# Patient Record
Sex: Female | Born: 1995 | Race: Black or African American | Hispanic: No | Marital: Single | Smoking: Never smoker
Health system: Southern US, Community
[De-identification: ages and names within clinical notes are randomized; demographics above are authoritative.]

## PROBLEM LIST (undated history)

## (undated) ENCOUNTER — Inpatient Hospital Stay (HOSPITAL_COMMUNITY): Payer: Self-pay

## (undated) DIAGNOSIS — Z9109 Other allergy status, other than to drugs and biological substances: Secondary | ICD-10-CM

## (undated) DIAGNOSIS — R51 Headache: Secondary | ICD-10-CM

## (undated) DIAGNOSIS — D649 Anemia, unspecified: Secondary | ICD-10-CM

## (undated) DIAGNOSIS — O99013 Anemia complicating pregnancy, third trimester: Secondary | ICD-10-CM

## (undated) DIAGNOSIS — Q513 Bicornate uterus: Secondary | ICD-10-CM

## (undated) DIAGNOSIS — L732 Hidradenitis suppurativa: Secondary | ICD-10-CM

## (undated) DIAGNOSIS — L709 Acne, unspecified: Secondary | ICD-10-CM

## (undated) HISTORY — DX: Acne, unspecified: L70.9

## (undated) HISTORY — PX: TONSILLECTOMY: SHX5217

## (undated) HISTORY — DX: Anemia complicating pregnancy, third trimester: O99.013

## (undated) HISTORY — PX: TUBAL LIGATION: SHX77

## (undated) HISTORY — DX: Headache: R51

## (undated) HISTORY — PX: TONSILLECTOMY: SUR1361

---
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
## 2015-03-25 ENCOUNTER — Emergency Department (INDEPENDENT_AMBULATORY_CARE_PROVIDER_SITE_OTHER)
Admission: EM | Admit: 2015-03-25 | Discharge: 2015-03-25 | Disposition: A | Discharge status: Home / Self Care | Payer: Medicaid Other | Source: Home / Self Care | Attending: Family Medicine | Admitting: Family Medicine

## 2015-03-25 DIAGNOSIS — K088 Other specified disorders of teeth and supporting structures: Secondary | ICD-10-CM | POA: Diagnosis not present

## 2015-03-25 DIAGNOSIS — K0889 Other specified disorders of teeth and supporting structures: Secondary | ICD-10-CM

## 2015-03-25 MED ORDER — ONDANSETRON 4 MG PO TBDP: 8.0000 mg | ORAL_TABLET | Freq: Once | ORAL | Status: DC

## 2015-03-25 MED ORDER — DICLOFENAC POTASSIUM 50 MG PO TABS: 50.0000 mg | ORAL_TABLET | Freq: Three times a day (TID) | ORAL | Status: DC

## 2015-03-25 MED ORDER — ONDANSETRON 4 MG PO TBDP
ORAL_TABLET | ORAL | Status: AC
  Filled 2015-03-25: qty 2

## 2015-03-25 NOTE — Discharge Instructions (Signed)
Take medicine as prescribed, see your dentist as soon as possible °

## 2015-03-25 NOTE — ED Notes (Signed)
Left mouth pain, HA, dizzy and occ vomiting x 2 days

## 2015-03-25 NOTE — ED Provider Notes (Signed)
CSN: [id number]     Arrival date & time 03/25/15  1705 History   First MD Initiated Contact with Patient 03/25/15 1803     Chief Complaint  Patient presents with  . Dental Pain   (Consider location/radiation/quality/duration/timing/severity/associated sxs/prior Treatment) Patient is a 19 y.o. female presenting with tooth pain. The history is provided by the patient.  Dental Pain Location:  Lower Lower teeth location:  17/LL 3rd molar Quality:  Aching and shooting Severity:  Mild Onset quality:  Gradual Duration:  2 days Progression:  Unchanged Chronicity:  New Context comment:  Also with n/v. has braces on teeth, to see dentist in am mon. Relieved by:  None tried Worsened by:  Nothing tried Ineffective treatments:  None tried Associated symptoms: no fever and no gum swelling   Risk factors: sufficient dental care and no periodontal disease   Risk factors comment:  Denies pregnancy   Past Medical History  Diagnosis Date  . Environmental allergies   . WUJWJXBJ(478.2Headache(784.0)    Past Surgical History  Procedure Laterality Date  . Tonsillectomy    . Tonsillectomy     Family History  Problem Relation Age of Onset  . Depression Mother    History  Substance Use Topics  . Smoking status: Never Smoker   . Smokeless tobacco: Never Used  . Alcohol Use: No   OB History    No data available     Review of Systems  Constitutional: Negative.  Negative for fever.  HENT: Positive for dental problem.   Gastrointestinal: Positive for nausea and vomiting. Negative for diarrhea.  Musculoskeletal: Negative.     Allergies  Amoxicillin-pot clavulanate and Metronidazole  Home Medications   Prior to Admission medications   Medication Sig Start Date End Date Taking? Authorizing Provider  acetaminophen (TYLENOL) 500 MG tablet Take 1,000 mg by mouth every 6 (six) hours as needed for moderate pain.    Historical Provider, MD  albuterol (PROVENTIL HFA;VENTOLIN HFA) 108 (90 BASE) MCG/ACT  inhaler Inhale 2 puffs into the lungs every 4 (four) hours as needed for wheezing or shortness of breath. 01/27/14   Owens SharkMartha F Perry, MD  diclofenac (CATAFLAM) 50 MG tablet Take 1 tablet (50 mg total) by mouth 3 (three) times daily. For dental pain 03/25/15   Linna HoffJames D Jerni Selmer, MD  diclofenac (VOLTAREN) 50 MG EC tablet Take 1 tablet (50 mg total) by mouth 2 (two) times daily. 07/26/14   Narda Bondsalph A Nettey, MD  Etonogestrel [facility](IMPL[facility] [place]) Inject 1 application into the skin continuous. Implanted January 28, 2013    Historical Provider, MD  fluconazole (DIFLUCAN) 150 MG tablet Take 1 tablet (150 mg total) by mouth once. 09/28/14   Voncille LoKate Ettefagh, MD  fluticasone (FLONASE) 50 MCG/ACT nasal spray Place 2 sprays into both nostrils daily as needed for rhinitis. 01/27/14   Owens SharkMartha F Perry, MD  hydrOXYzine (ATARAX/VISTARIL) 50 MG tablet Take 1 tablet (50 mg total) by mouth at bedtime. 09/27/14   Voncille LoKate Ettefagh, MD  loratadine (CLARITIN) 10 MG tablet Take 1 tablet (10 mg total) by mouth daily. 03/28/14   Marlon Peliffany Greene, PA-C  tetrahydrozoline 0.05 % ophthalmic solution Place 2 drops into both eyes 2 (two) times daily as needed (for dry eyes).    Historical Provider, MD   BP 122/70 mmHg  Pulse 76  Temp(Src) 99.2 F (37.3 C) (Oral)  Resp 16  SpO2 100%  LMP 03/23/2015 Physical Exam  Constitutional: She is oriented to person, place, and time. She appears well-developed and well-nourished. No distress.  HENT:  Mouth/Throat: Oropharynx is clear and moist.  Braces in place and intact.tender over 3rd #17 tooth.  Neck: Normal range of motion. Neck supple.  Abdominal: Soft. Bowel sounds are normal. There is no tenderness.  Lymphadenopathy:    She has no cervical adenopathy.  Neurological: She is alert and oriented to person, place, and time.  Skin: Skin is warm.  Nursing note and vitals reviewed.   ED Course  Procedures (including critical care time) Labs Review Labs Reviewed - No data to display  Imaging Review No  results found.   MDM   1. Pain, dental        Linna HoffJames D Dashiell Franchino, MD 03/25/15 [phone]

## 2015-05-09 ENCOUNTER — Emergency Department (HOSPITAL_COMMUNITY)
Admission: EM | Admit: 2015-05-09 | Discharge: 2015-05-10 | Disposition: A | Discharge status: Home / Self Care | Payer: Federal, State, Local not specified - PPO | Attending: Emergency Medicine | Admitting: Emergency Medicine

## 2015-05-09 ENCOUNTER — Encounter (HOSPITAL_COMMUNITY): Payer: Self-pay | Admitting: Emergency Medicine

## 2015-05-09 DIAGNOSIS — J069 Acute upper respiratory infection, unspecified: Secondary | ICD-10-CM

## 2015-05-09 DIAGNOSIS — H9209 Otalgia, unspecified ear: Secondary | ICD-10-CM | POA: Diagnosis not present

## 2015-05-09 DIAGNOSIS — Z79899 Other long term (current) drug therapy: Secondary | ICD-10-CM | POA: Diagnosis not present

## 2015-05-09 DIAGNOSIS — R0981 Nasal congestion: Secondary | ICD-10-CM | POA: Diagnosis present

## 2015-05-09 DIAGNOSIS — B9789 Other viral agents as the cause of diseases classified elsewhere: Secondary | ICD-10-CM

## 2015-05-09 NOTE — ED Notes (Signed)
Pt. reports nasal congestion onset this week unrelieved by OTC decongestants , denies fever or chills.

## 2015-05-09 NOTE — ED Notes (Addendum)
Spoke with Dahlia Client PA about triage pain protocol, Dahlia Client PA declined to give medication at this time.

## 2015-05-09 NOTE — ED Provider Notes (Signed)
CSN: [id number]     Arrival date & time 05/09/15  2216 History   This chart was scribed for Dierdre Forth, PA-C working with Vanetta Mulders, MD by Elveria Rising, ED Scribe. This patient was seen in room TR08C/TR08C and the patient's care was started at 12:03 AM.   Chief Complaint  Patient presents with  . Nasal Congestion   The history is provided by the patient and medical records. No language interpreter was used.   HPI Comments: Judy Rodgers is a 19 y.o. female who presents to the Emergency Department complaining of URI like symptoms including nasal congestion, cough, wheezing, headache and ear pain onset two days ago. Patient reports treatment with Sudafed, Dayquil, and Afrin without relief. Patient shares seasonal history of seasonal, asthmatic allergies. Patient has an at home inhaler which she needs updated. Patient shares that she has an appointment in two weeks with her PCP but she states she couldn't wait until then. Patient shares that she had to leave work today because her worsening symptoms. Patient reports recent sick contacts: niece and nephew.  She denies fever, chills, neck pain, neck stiffness, chest pain, shortness of breath, abdominal pain, nausea, vomiting, diarrhea.  Past Medical History  Diagnosis Date  . Environmental allergies   . WUJWJXBJ(478.2)    Past Surgical History  Procedure Laterality Date  . Tonsillectomy    . Tonsillectomy     Family History  Problem Relation Age of Onset  . Depression Mother    History  Substance Use Topics  . Smoking status: Never Smoker   . Smokeless tobacco: Never Used  . Alcohol Use: No   OB History    No data available     Review of Systems  Constitutional: Positive for fatigue. Negative for fever, chills and appetite change.  HENT: Positive for congestion, ear pain, postnasal drip, rhinorrhea, sinus pressure and sore throat. Negative for ear discharge and mouth sores.   Eyes: Negative for visual disturbance.   Respiratory: Positive for cough, chest tightness and wheezing. Negative for shortness of breath and stridor.   Cardiovascular: Negative for chest pain, palpitations and leg swelling.  Gastrointestinal: Negative for nausea, vomiting, abdominal pain and diarrhea.  Genitourinary: Negative for dysuria, urgency, frequency and hematuria.  Musculoskeletal: Negative for myalgias, back pain, arthralgias and neck stiffness.  Skin: Negative for rash.  Neurological: Positive for headaches (frontal). Negative for syncope, light-headedness and numbness.  Hematological: Negative for adenopathy.  Psychiatric/Behavioral: The patient is not nervous/anxious.   All other systems reviewed and are negative.  Allergies  Amoxicillin-pot clavulanate and Metronidazole  Home Medications   Prior to Admission medications   Medication Sig Start Date End Date Taking? Authorizing Provider  acetaminophen (TYLENOL) 500 MG tablet Take 1,000 mg by mouth every 6 (six) hours as needed for moderate pain.    Historical Provider, MD  albuterol (PROVENTIL HFA;VENTOLIN HFA) 108 (90 BASE) MCG/ACT inhaler Inhale 2 puffs into the lungs every 4 (four) hours as needed for wheezing or shortness of breath. 05/10/15   America Sandall, PA-C  benzonatate (TESSALON) 100 MG capsule Take 1 capsule (100 mg total) by mouth every 8 (eight) hours. 05/10/15   Yulianna Folse, PA-C  diclofenac (CATAFLAM) 50 MG tablet Take 1 tablet (50 mg total) by mouth 3 (three) times daily. For dental pain 03/25/15   Linna Hoff, MD  diclofenac (VOLTAREN) 50 MG EC tablet Take 1 tablet (50 mg total) by mouth 2 (two) times daily. 07/26/14   Narda Bonds, MD  Etonogestrel [facility]  [place]) Inject 1 application into the skin continuous. Implanted January 28, 2013    Historical Provider, MD  fluconazole (DIFLUCAN) 150 MG tablet Take 1 tablet (150 mg total) by mouth once. 09/28/14   Voncille Lo, MD  fluticasone (FLONASE) 50 MCG/ACT nasal spray Place 2 sprays into both  nostrils daily as needed for rhinitis. 01/27/14   Owens Shark, MD  hydrOXYzine (ATARAX/VISTARIL) 50 MG tablet Take 1 tablet (50 mg total) by mouth at bedtime. 09/27/14   Voncille Lo, MD  loratadine (CLARITIN) 10 MG tablet Take 1 tablet (10 mg total) by mouth daily. 03/28/14   Tiffany Neva Seat, PA-C  mometasone (NASONEX) 50 MCG/ACT nasal spray Place 2 sprays into the nose daily. 05/10/15   Kaitlynne Wenz, PA-C  tetrahydrozoline 0.05 % ophthalmic solution Place 2 drops into both eyes 2 (two) times daily as needed (for dry eyes).    Historical Provider, MD   Triage Vitals: BP 119/67 mmHg  Pulse 87  Temp(Src) 98.5 F (36.9 C) (Oral)  Resp 16  SpO2 100%  LMP 05/02/2015 Physical Exam  Constitutional: She is oriented to person, place, and time. She appears well-developed and well-nourished. No distress.  HENT:  Head: Normocephalic and atraumatic.  Right Ear: Tympanic membrane, external ear and ear canal normal.  Left Ear: Tympanic membrane, external ear and ear canal normal.  Nose: Mucosal edema and rhinorrhea present. No epistaxis. Right sinus exhibits no maxillary sinus tenderness and no frontal sinus tenderness. Left sinus exhibits no maxillary sinus tenderness and no frontal sinus tenderness.  Mouth/Throat: Uvula is midline, oropharynx is clear and moist and mucous membranes are normal. Mucous membranes are not pale and not cyanotic. No oropharyngeal exudate, posterior oropharyngeal edema, posterior oropharyngeal erythema or tonsillar abscesses.  Eyes: Conjunctivae are normal. Pupils are equal, round, and reactive to light.  Neck: Normal range of motion and full passive range of motion without pain.  Cardiovascular: Normal rate and intact distal pulses.   Pulmonary/Chest: Effort normal. No stridor. She has decreased breath sounds ( Throughout).  Diminished but equal breath sounds without focal wheezes, rhonchi, rales  Abdominal: Soft. Bowel sounds are normal. There is no tenderness.   Musculoskeletal: Normal range of motion.  Lymphadenopathy:    She has no cervical adenopathy.  Neurological: She is alert and oriented to person, place, and time.  Skin: Skin is warm and dry. No rash noted. She is not diaphoretic.  Psychiatric: She has a normal mood and affect.  Nursing note and vitals reviewed.   ED Course  Procedures (including critical care time)  COORDINATION OF CARE: 12:09 AM- Discussed treatment plan with patient at bedside and patient agreed to plan.   Labs Review Labs Reviewed - No data to display  Imaging Review No results found.   EKG Interpretation None      MDM   Final diagnoses:  Viral URI with cough   Judy Rodgers presents with URI symptoms.  Pt without systemic symptoms, fever or focal lung sounds, no indication for chest x-ray at this time. Patients symptoms are consistent with URI, likely viral etiology. No tenderness to palpation of the sinuses. Discussed that antibiotics are not indicated for viral infections. She complains of chest tightness and a history of wheezing due to seasonal allergies. Recommend the patient begin Zyrtec. Will give albuterol here.  12:58 AM Pt with increased tidal volume after albuterol treatment.  No focal wheezing noted. Patient with normal oxygen saturation saturations and in no distress.  Pt will be discharged with symptomatic treatment.  Verbalizes understanding and is agreeable with plan. Pt is hemodynamically stable & in NAD prior to dc.   BP 133/69 mmHg  Pulse 91  Temp(Src) 98.1 F (36.7 C) (Oral)  Resp 22  SpO2 100%  LMP 05/02/2015  I personally performed the services described in this documentation, which was scribed in my presence. The recorded information has been reviewed and is accurate.   Dahlia Client Carmilla Granville, PA-C 05/10/15 [id number]  Vanetta Mulders, MD 05/13/15 [phone]

## 2015-05-10 DIAGNOSIS — J069 Acute upper respiratory infection, unspecified: Secondary | ICD-10-CM | POA: Diagnosis not present

## 2015-05-10 MED ORDER — ALBUTEROL SULFATE (2.5 MG/3ML) 0.083% IN NEBU
5.0000 mg | INHALATION_SOLUTION | Freq: Once | RESPIRATORY_TRACT | Status: AC
  Administered 2015-05-10: 5 mg via RESPIRATORY_TRACT
  Filled 2015-05-10: qty 6

## 2015-05-10 MED ORDER — ALBUTEROL SULFATE HFA 108 (90 BASE) MCG/ACT IN AERS: 2.0000 | INHALATION_SPRAY | RESPIRATORY_TRACT | Status: DC | PRN

## 2015-05-10 MED ORDER — BENZONATATE 100 MG PO CAPS
200.0000 mg | ORAL_CAPSULE | Freq: Once | ORAL | Status: AC
  Administered 2015-05-10: 200 mg via ORAL
  Filled 2015-05-10: qty 2

## 2015-05-10 MED ORDER — BENZONATATE 100 MG PO CAPS: 100.0000 mg | ORAL_CAPSULE | Freq: Three times a day (TID) | ORAL | Status: DC

## 2015-05-10 MED ORDER — MOMETASONE FUROATE 50 MCG/ACT NA SUSP: 2.0000 | Freq: Every day | NASAL | Status: DC

## 2015-05-10 NOTE — Discharge Instructions (Signed)
1. Medications: albuterol, flonase, mucinex, tessalon, usual home medications 2. Treatment: rest, drink plenty of fluids, take tylenol or ibuprofen for fever control 3. Follow Up: Please followup with your primary doctor in 3 days for discussion of your diagnoses and further evaluation after today's visit; if you do not have a primary care doctor use the resource guide provided to find one; Return to the ER for high fevers, difficulty breathing or other concerning symptoms    Upper Respiratory Infection, Adult An upper respiratory infection (URI) is also sometimes known as the common cold. The upper respiratory tract includes the nose, sinuses, throat, trachea, and bronchi. Bronchi are the airways leading to the lungs. Most people improve within 1 week, but symptoms can last up to 2 weeks. A residual cough may last even longer.  CAUSES Many different viruses can infect the tissues lining the upper respiratory tract. The tissues become irritated and inflamed and often become very moist. Mucus production is also common. A cold is contagious. You can easily spread the virus to others by oral contact. This includes kissing, sharing a glass, coughing, or sneezing. Touching your mouth or nose and then touching a surface, which is then touched by another person, can also spread the virus. SYMPTOMS  Symptoms typically develop 1 to 3 days after you come in contact with a cold virus. Symptoms vary from person to person. They may include:  Runny nose.  Sneezing.  Nasal congestion.  Sinus irritation.  Sore throat.  Loss of voice (laryngitis).  Cough.  Fatigue.  Muscle aches.  Loss of appetite.  Headache.  Low-grade fever. DIAGNOSIS  You might diagnose your own cold based on familiar symptoms, since most people get a cold 2 to 3 times a year. Your caregiver can confirm this based on your exam. Most importantly, your caregiver can check that your symptoms are not due to another disease such as  strep throat, sinusitis, pneumonia, asthma, or epiglottitis. Blood tests, throat tests, and X-rays are not necessary to diagnose a common cold, but they may sometimes be helpful in excluding other more serious diseases. Your caregiver will decide if any further tests are required. RISKS AND COMPLICATIONS  You may be at risk for a more severe case of the common cold if you smoke cigarettes, have chronic heart disease (such as heart failure) or lung disease (such as asthma), or if you have a weakened immune system. The very young and very old are also at risk for more serious infections. Bacterial sinusitis, middle ear infections, and bacterial pneumonia can complicate the common cold. The common cold can worsen asthma and chronic obstructive pulmonary disease (COPD). Sometimes, these complications can require emergency medical care and may be life-threatening. PREVENTION  The best way to protect against getting a cold is to practice good hygiene. Avoid oral or hand contact with people with cold symptoms. Wash your hands often if contact occurs. There is no clear evidence that vitamin C, vitamin E, echinacea, or exercise reduces the chance of developing a cold. However, it is always recommended to get plenty of rest and practice good nutrition. TREATMENT  Treatment is directed at relieving symptoms. There is no cure. Antibiotics are not effective, because the infection is caused by a virus, not by bacteria. Treatment may include:  Increased fluid intake. Sports drinks offer valuable electrolytes, sugars, and fluids.  Breathing heated mist or steam (vaporizer or shower).  Eating chicken soup or other clear broths, and maintaining good nutrition.  Getting plenty of rest.  Using gargles or lozenges for comfort.  Controlling fevers with ibuprofen or acetaminophen as directed by your caregiver.  Increasing usage of your inhaler if you have asthma. Zinc gel and zinc lozenges, taken in the first 24 hours  of the common cold, can shorten the duration and lessen the severity of symptoms. Pain medicines may help with fever, muscle aches, and throat pain. A variety of non-prescription medicines are available to treat congestion and runny nose. Your caregiver can make recommendations and may suggest nasal or lung inhalers for other symptoms.  HOME CARE INSTRUCTIONS   Only take over-the-counter or prescription medicines for pain, discomfort, or fever as directed by your caregiver.  Use a warm mist humidifier or inhale steam from a shower to increase air moisture. This may keep secretions moist and make it easier to breathe.  Drink enough water and fluids to keep your urine clear or pale yellow.  Rest as needed.  Return to work when your temperature has returned to normal or as your caregiver advises. You may need to stay home longer to avoid infecting others. You can also use a face mask and careful hand washing to prevent spread of the virus. SEEK MEDICAL CARE IF:   After the first few days, you feel you are getting worse rather than better.  You need your caregiver's advice about medicines to control symptoms.  You develop chills, worsening shortness of breath, or brown or red sputum. These may be signs of pneumonia.  You develop yellow or brown nasal discharge or pain in the face, especially when you bend forward. These may be signs of sinusitis.  You develop a fever, swollen neck glands, pain with swallowing, or white areas in the back of your throat. These may be signs of strep throat. SEEK IMMEDIATE MEDICAL CARE IF:   You have a fever.  You develop severe or persistent headache, ear pain, sinus pain, or chest pain.  You develop wheezing, a prolonged cough, cough up blood, or have a change in your usual mucus (if you have chronic lung disease).  You develop sore muscles or a stiff neck. Document Released: 04/29/2001 Document Revised: 01/26/2012 Document Reviewed: 02/08/2014 [facility]  Patient Information 2015 [place], [place]. This information is not intended to replace advice given to you by your health care provider. Make sure you discuss any questions you have with your health care provider.

## 2015-06-09 ENCOUNTER — Emergency Department (INDEPENDENT_AMBULATORY_CARE_PROVIDER_SITE_OTHER)
Admission: EM | Admit: 2015-06-09 | Discharge: 2015-06-09 | Disposition: A | Discharge status: Home / Self Care | Payer: Federal, State, Local not specified - PPO | Source: Home / Self Care | Attending: Family Medicine | Admitting: Family Medicine

## 2015-06-09 ENCOUNTER — Encounter (HOSPITAL_COMMUNITY): Payer: Self-pay | Admitting: Emergency Medicine

## 2015-06-09 DIAGNOSIS — A084 Viral intestinal infection, unspecified: Secondary | ICD-10-CM

## 2015-06-09 MED ORDER — ONDANSETRON HCL 4 MG PO TABS: 4.0000 mg | ORAL_TABLET | Freq: Three times a day (TID) | ORAL | Status: DC | PRN

## 2015-06-09 NOTE — ED Provider Notes (Signed)
CSN: [id number]     Arrival date & time 06/09/15  1539 History   First MD Initiated Contact with Patient 06/09/15 [phone]     Chief Complaint  Patient presents with  . Nausea  . Diarrhea   (Consider location/radiation/quality/duration/timing/severity/associated sxs/prior Treatment) HPI  Nausea vomiting diarrhea started 36 hours ago. Patient states that prior to that she had eaten a sub-for dinner but does not think that it's related to that. States that today she started 3 times. One bout of nonbloody watery diarrhea today. Abdominal discomfort. Denies any fevers, chest pain, shortness of breath, palpitations, rash. Denies any sick contacts. Symptoms are not this early getting worse but are not getting better either. Symptoms are intermittent. Patient is not able to tolerate any foods but has been Acute onset Gatorade.  Past Medical History  Diagnosis Date  . Environmental allergies   . CZYSAYTK(160.1)    Past Surgical History  Procedure Laterality Date  . Tonsillectomy    . Tonsillectomy     Family History  Problem Relation Age of Onset  . Depression Mother    History  Substance Use Topics  . Smoking status: Never Smoker   . Smokeless tobacco: Never Used  . Alcohol Use: No   OB History    No data available     Review of Systems Per HPI with all other pertinent systems negative.   Allergies  Amoxicillin-pot clavulanate and Metronidazole  Home Medications   Prior to Admission medications   Medication Sig Start Date End Date Taking? Authorizing Provider  acetaminophen (TYLENOL) 500 MG tablet Take 1,000 mg by mouth every 6 (six) hours as needed for moderate pain.    Historical Provider, MD  albuterol (PROVENTIL HFA;VENTOLIN HFA) 108 (90 BASE) MCG/ACT inhaler Inhale 2 puffs into the lungs every 4 (four) hours as needed for wheezing or shortness of breath. 05/10/15   Hannah Muthersbaugh, PA-C  benzonatate (TESSALON) 100 MG capsule Take 1 capsule (100 mg total) by mouth every  8 (eight) hours. 05/10/15   Hannah Muthersbaugh, PA-C  diclofenac (CATAFLAM) 50 MG tablet Take 1 tablet (50 mg total) by mouth 3 (three) times daily. For dental pain 03/25/15   Linna Hoff, MD  diclofenac (VOLTAREN) 50 MG EC tablet Take 1 tablet (50 mg total) by mouth 2 (two) times daily. 07/26/14   Narda Bonds, MD  Etonogestrel [facility]) Inject 1 application into the skin continuous. Implanted January 28, 2013    Historical Provider, MD  fluconazole (DIFLUCAN) 150 MG tablet Take 1 tablet (150 mg total) by mouth once. 09/28/14   Voncille Lo, MD  fluticasone (FLONASE) 50 MCG/ACT nasal spray Place 2 sprays into both nostrils daily as needed for rhinitis. 01/27/14   Owens Shark, MD  hydrOXYzine (ATARAX/VISTARIL) 50 MG tablet Take 1 tablet (50 mg total) by mouth at bedtime. 09/27/14   Voncille Lo, MD  loratadine (CLARITIN) 10 MG tablet Take 1 tablet (10 mg total) by mouth daily. 03/28/14   Tiffany Neva Seat, PA-C  mometasone (NASONEX) 50 MCG/ACT nasal spray Place 2 sprays into the nose daily. 05/10/15   Hannah Muthersbaugh, PA-C  ondansetron (ZOFRAN) 4 MG tablet Take 1 tablet (4 mg total) by mouth every 8 (eight) hours as needed for nausea or vomiting. 06/09/15   Ozella Rocks, MD  tetrahydrozoline 0.05 % ophthalmic solution Place 2 drops into both eyes 2 (two) times daily as needed (for dry eyes).    Historical Provider, MD   Temp(Src) 99 F (37.2 C) (Oral)  SpO2 99% Physical Exam  Physical Exam  Constitutional: oriented to person, place, and time. appears well-developed and well-nourished. No distress.  HENT:  Head: Normocephalic and atraumatic.  Eyes: EOMI. PERRL.  Neck: Normal range of motion.  Cardiovascular: RRR, no m/r/g, 2+ distal pulses,  Pulmonary/Chest: Effort normal and breath sounds normal. No respiratory distress.  Abdominal: Soft. Bowel sounds are normal. NonTTP, no distension.  Musculoskeletal: Normal range of motion. Non ttp, no effusion.  Neurological: alert and oriented  to person, place, and time.  Skin: Skin is warm. No rash noted. non diaphoretic.  Psychiatric: normal mood and affect. behavior is normal. Judgment and thought content normal.    ED Course  Procedures (including critical care time) Labs Review Labs Reviewed - No data to display  Imaging Review No results found.   MDM   1. Viral gastroenteritis    Zofran, fluids, rest, Imodium sparingly. Cautions given and all questions answered.    Ozella Rocks, MD 06/09/15 [phone]

## 2015-06-09 NOTE — Discharge Instructions (Signed)
Your symptoms are likely due to a viral stomach infection called gastroenteritis. This will likely resolve over the course the next 1-3 days but may last as long as 2 weeks. Please use Zofran for nausea relief. Please advance her diet slowly as tolerated. Please use Imodium sparingly.

## 2015-06-09 NOTE — ED Notes (Signed)
C/o nausea and diarrhea since last night Crackers and gatorade used as tx

## 2015-08-09 ENCOUNTER — Encounter (INDEPENDENT_AMBULATORY_CARE_PROVIDER_SITE_OTHER): Payer: Self-pay

## 2015-08-09 ENCOUNTER — Ambulatory Visit (INDEPENDENT_AMBULATORY_CARE_PROVIDER_SITE_OTHER): Payer: Federal, State, Local not specified - PPO | Admitting: Pediatrics

## 2015-08-09 ENCOUNTER — Encounter: Payer: Self-pay | Admitting: Pediatrics

## 2015-08-09 VITALS — BP 112/56 | Ht 63.0 in | Wt 141.6 lb

## 2015-08-09 DIAGNOSIS — N760 Acute vaginitis: Secondary | ICD-10-CM | POA: Diagnosis not present

## 2015-08-09 DIAGNOSIS — G43109 Migraine with aura, not intractable, without status migrainosus: Secondary | ICD-10-CM

## 2015-08-09 DIAGNOSIS — R6889 Other general symptoms and signs: Secondary | ICD-10-CM | POA: Diagnosis not present

## 2015-08-09 DIAGNOSIS — J309 Allergic rhinitis, unspecified: Secondary | ICD-10-CM

## 2015-08-09 DIAGNOSIS — Z0001 Encounter for general adult medical examination with abnormal findings: Secondary | ICD-10-CM

## 2015-08-09 DIAGNOSIS — Z113 Encounter for screening for infections with a predominantly sexual mode of transmission: Secondary | ICD-10-CM | POA: Diagnosis not present

## 2015-08-09 DIAGNOSIS — H1013 Acute atopic conjunctivitis, bilateral: Secondary | ICD-10-CM | POA: Diagnosis not present

## 2015-08-09 DIAGNOSIS — A499 Bacterial infection, unspecified: Secondary | ICD-10-CM | POA: Diagnosis not present

## 2015-08-09 DIAGNOSIS — B9689 Other specified bacterial agents as the cause of diseases classified elsewhere: Secondary | ICD-10-CM

## 2015-08-09 MED ORDER — IBUPROFEN 600 MG PO TABS: 600.0000 mg | ORAL_TABLET | Freq: Four times a day (QID) | ORAL | Status: DC | PRN

## 2015-08-09 MED ORDER — OLOPATADINE HCL 0.2 % OP SOLN: 1.0000 [drp] | Freq: Every day | OPHTHALMIC | Status: DC | PRN

## 2015-08-09 MED ORDER — CETIRIZINE HCL 10 MG PO TABS: 10.0000 mg | ORAL_TABLET | Freq: Every day | ORAL | Status: DC

## 2015-08-09 MED ORDER — CLINDAMYCIN HCL 300 MG PO CAPS: 300.0000 mg | ORAL_CAPSULE | Freq: Two times a day (BID) | ORAL | Status: DC

## 2015-08-09 MED ORDER — METRONIDAZOLE 0.75 % VA GEL: 1.0000 | Freq: Two times a day (BID) | VAGINAL | Status: DC

## 2015-08-09 NOTE — Progress Notes (Signed)
Routine Well-Adolescent Visit  PCP: Heber Boyce, MD   History was provided by the patient.  Judy Rodgers is a 19 y.o. female who is here for annual PE.  Current concerns:   1. headaches - headaches for the past 6 days straight.  For the past few months.  The pain is throbbing and bifrontal in location.  + photophobia, + phonophobia.  Improves slight with tylenol or ibuprofen.  She sometimes feel nauseated prior to the onset of her headaches.  She is sleeping from 10 PM to 7 AM.  She is spending several hours.    2. Foul-smelling vaginal discharge - history of recurrent BV. Would like to know if she could take something to prevent recurrence of BV.    Adolescent Assessment:  Confidentiality was discussed with the patient and if applicable, with caregiver as well.  Home and Environment:  Lives with: lives at home with mother and younger sister Parental relations: good Friends/Peers: at work Nutrition/Eating Behaviors: no dietary restrictions  Sports/Exercise:  None currently  Education and Employment:  School Status: in school at [employer] History: School attendance is regular. Work: at a call center  Activities: working and in school  With parent out of the room and confidentiality discussed:   Patient reports being comfortable and safe at school and at home? Yes  Smoking: no Secondhand smoke exposure? no Drugs/EtOH: denies   Menstruation:   Menarche: post menarchal Menstrual History: irregular menstrual bleeding since having the nexplanon placed.   Sexuality: attracted to males Sexually active? yes  contraception use: Nexplanon and condoms   Screenings: The patient completed the Rapid Assessment for Adolescent Preventive Services screening questionnaire and the following topics were identified as risk factors and discussed: birth control and sexuality  In addition, the following topics were discussed as part of anticipatory guidance tobacco use, marijuana  use, drug use and condom use.  PHQ-9 completed and results indicated no signs of depression  Physical Exam:  BP 112/56 mmHg  Ht  (1.6 m)  Wt 141 lb 9.6 oz (64.229 kg)  BMI 25.09 kg/m2 Blood pressure percentiles are 63% systolic and 26% diastolic based on 2000 NHANES data.   General Appearance:   alert, oriented, no acute distress and well nourished  HENT: Normocephalic, no obvious abnormality, conjunctiva clear  Mouth:   Normal appearing teeth, no obvious discoloration, dental caries, or dental caps  Neck:   Supple; thyroid: no enlargement, symmetric, no tenderness/mass/nodules  Lungs:   Clear to auscultation bilaterally, normal work of breathing  Heart:   Regular rate and rhythm, S1 and S2 normal, no murmurs;   Abdomen:   Soft, non-tender, no mass, or organomegaly  GU normal female external genitalia, pelvic not performed, Tanner stage V  Musculoskeletal:   Tone and strength strong and symmetrical, all extremities               Lymphatic:   No cervical adenopathy  Skin/Hair/Nails:   Skin warm, dry and intact, no rashes, no bruises or petechiae  Neurologic:   Strength, gait, and coordination normal and age-appropriate    Assessment/Plan:  1. Routine screening for STI (sexually transmitted infection) - GC/chlamydia probe amp, urine - HIV antibody - RPR  2. Migraine with aura and without status migrainosus, not intractable Supportive cares, return precautions, and emergency procedures reviewed.  Headache calendar given to complete and bring to follow-up visit in 1 month - ibuprofen (ADVIL,MOTRIN) 600 MG tablet; Take 1 tablet (600 mg total) by mouth every 6 (  six) hours as needed.  Dispense: 30 tablet; Refill: 0  3. Bacterial vaginosis Treat with oral clindamycin for 7 days.  Start topical metronidazole after completion of clindamycin.   - metroNIDAZOLE (METROGEL) 0.75 % vaginal gel; Place 1 Applicatorful vaginally 2 (two) times daily.  Dispense: 70 g; Refill: 4 - WET PREP BY  MOLECULAR PROBE - clindamycin (CLEOCIN) 300 MG capsule; Take 1 capsule (300 mg total) by mouth 2 (two) times daily.  Dispense: 14 capsule; Refill: 0  4. Allergic conjunctivitis and rhinitis, bilateral - cetirizine (ZYRTEC) 10 MG tablet; Take 1 tablet (10 mg total) by mouth daily.  Dispense: 30 tablet; Refill: 2 - Olopatadine HCl 0.2 % SOLN; Apply 1 drop to eye daily as needed (eye allergy symptoms).  Dispense: 2.5 mL; Refill: 4   BMI: is appropriate for age.    Immunizations today: per orders.  - Follow-up visit in 1 month for follow-up headaches, or sooner as needed.   Keyarah Mcroy, Betti Cruz, MD

## 2015-08-10 ENCOUNTER — Ambulatory Visit: Payer: Medicaid Other | Admitting: Pediatrics

## 2015-08-10 LAB — RPR

## 2015-08-10 LAB — WET PREP BY MOLECULAR PROBE
CANDIDA SPECIES: NEGATIVE
Gardnerella vaginalis: POSITIVE — AB
Trichomonas vaginosis: NEGATIVE

## 2015-08-10 LAB — GC/CHLAMYDIA PROBE AMP, URINE
CHLAMYDIA, SWAB/URINE, PCR: NEGATIVE
GC PROBE AMP, URINE: NEGATIVE

## 2015-08-10 LAB — HIV ANTIBODY (ROUTINE TESTING W REFLEX): HIV 1&2 Ab, 4th Generation: NONREACTIVE

## 2015-08-11 ENCOUNTER — Ambulatory Visit: Payer: Medicaid Other | Admitting: Pediatrics

## 2015-08-17 ENCOUNTER — Encounter: Payer: Self-pay | Admitting: Pediatrics

## 2015-09-11 ENCOUNTER — Ambulatory Visit: Payer: Medicaid Other | Admitting: Pediatrics

## 2015-09-13 ENCOUNTER — Ambulatory Visit: Payer: Federal, State, Local not specified - PPO | Admitting: Pediatrics

## 2015-09-13 ENCOUNTER — Encounter: Payer: Federal, State, Local not specified - PPO | Admitting: Licensed Clinical Social Worker

## 2015-09-18 HISTORY — PX: WISDOM TOOTH EXTRACTION: SHX21

## 2015-09-27 ENCOUNTER — Ambulatory Visit: Payer: Federal, State, Local not specified - PPO | Admitting: Pediatrics

## 2015-09-27 ENCOUNTER — Encounter: Payer: Federal, State, Local not specified - PPO | Admitting: Licensed Clinical Social Worker

## 2015-10-18 ENCOUNTER — Encounter: Payer: Self-pay | Admitting: Pediatrics

## 2015-10-18 ENCOUNTER — Other Ambulatory Visit: Payer: Self-pay | Admitting: Pediatrics

## 2015-10-18 ENCOUNTER — Ambulatory Visit (INDEPENDENT_AMBULATORY_CARE_PROVIDER_SITE_OTHER): Payer: Federal, State, Local not specified - PPO | Admitting: Pediatrics

## 2015-10-18 VITALS — BP 112/60 | Temp 97.4°F | Wt 142.0 lb

## 2015-10-18 DIAGNOSIS — N760 Acute vaginitis: Secondary | ICD-10-CM | POA: Diagnosis not present

## 2015-10-18 DIAGNOSIS — A499 Bacterial infection, unspecified: Secondary | ICD-10-CM

## 2015-10-18 DIAGNOSIS — N926 Irregular menstruation, unspecified: Secondary | ICD-10-CM

## 2015-10-18 DIAGNOSIS — D649 Anemia, unspecified: Secondary | ICD-10-CM

## 2015-10-18 DIAGNOSIS — R5383 Other fatigue: Secondary | ICD-10-CM

## 2015-10-18 DIAGNOSIS — B9689 Other specified bacterial agents as the cause of diseases classified elsewhere: Secondary | ICD-10-CM

## 2015-10-18 LAB — POCT HEMOGLOBIN: Hemoglobin: 8.3 g/dL — AB (ref 12.2–16.2)

## 2015-10-18 MED ORDER — FERROUS SULFATE 325 (65 FE) MG PO TABS: 325.0000 mg | ORAL_TABLET | Freq: Every day | ORAL | Status: DC

## 2015-10-18 MED ORDER — IBUPROFEN 600 MG PO TABS: 600.0000 mg | ORAL_TABLET | Freq: Four times a day (QID) | ORAL | Status: DC | PRN

## 2015-10-18 NOTE — Patient Instructions (Addendum)
Anemia, Nonspecific Anemia is a condition in which the concentration of red blood cells or hemoglobin in the blood is below normal. Hemoglobin is a substance in red blood cells that carries oxygen to the tissues of the body. Anemia results in not enough oxygen reaching these tissues.  CAUSES  Common causes of anemia include:   Excessive bleeding. Bleeding may be internal or external. This includes excessive bleeding from periods (in women) or from the intestine.   Poor nutrition.   Chronic kidney, thyroid, and liver disease.  Bone marrow disorders that decrease red blood cell production.  Cancer and treatments for cancer.  HIV, AIDS, and their treatments.  Spleen problems that increase red blood cell destruction.  Blood disorders.  Excess destruction of red blood cells due to infection, medicines, and autoimmune disorders. SIGNS AND SYMPTOMS   Minor weakness.   Dizziness.   Headache.  Palpitations.   Shortness of breath, especially with exercise.   Paleness.  Cold sensitivity.  Indigestion.  Nausea.  Difficulty sleeping.  Difficulty concentrating. Symptoms may occur suddenly or they may develop slowly.  DIAGNOSIS  Additional blood tests are often needed. These help your health care provider determine the best treatment. Your health care provider will check your stool for blood and look for other causes of blood loss.  TREATMENT  Treatment varies depending on the cause of the anemia. Treatment can include:   Supplements of iron, vitamin B12, or folic acid.   Hormone medicines.   A blood transfusion. This may be needed if blood loss is severe.   Hospitalization. This may be needed if there is significant continual blood loss.   Dietary changes.  Spleen removal. HOME CARE INSTRUCTIONS Keep all follow-up appointments. It often takes many weeks to correct anemia, and having your health care provider check on your condition and your response to  treatment is very important. SEEK IMMEDIATE MEDICAL CARE IF:   You develop extreme weakness, shortness of breath, or chest pain.   You become dizzy or have trouble concentrating.  You develop heavy vaginal bleeding.   You develop a rash.   You have bloody or black, tarry stools.   You faint.   You vomit up blood.   You vomit repeatedly.   You have abdominal pain.  You have a fever or persistent symptoms for more than 2-3 days.   You have a fever and your symptoms suddenly get worse.   You are dehydrated.  MAKE SURE YOU:  Understand these instructions.  Will watch your condition.  Will get help right away if you are not doing well or get worse.   This information is not intended to replace advice given to you by your health care provider. Make sure you discuss any questions you have with your health care provider.   Document Released: 12/11/2004 Document Revised: 07/06/2013 Document Reviewed: 04/29/2013 Elsevier Interactive Patient Education 2016 [employer]    Iron Deficiency Anemia, Adult Anemia is a condition in which there are less red blood cells or hemoglobin in the blood than normal. Hemoglobin is the part of red blood cells that carries oxygen. Iron deficiency anemia is anemia caused by too little iron. It is the most common type of anemia. It may leave you tired and short of breath. CAUSES   Lack of iron in the diet.  Poor absorption of iron, as seen with intestinal disorders.  Intestinal bleeding.  Heavy periods. SIGNS AND SYMPTOMS  Mild anemia may not be noticeable. Symptoms may include:  Fatigue.  Headache.  Pale skin.  Weakness.  Tiredness.  Shortness of breath.  Dizziness.  Cold hands and feet.  Fast or irregular heartbeat. DIAGNOSIS  Diagnosis requires a thorough evaluation and physical exam by your health care provider. Blood tests are generally used to confirm iron deficiency anemia. Additional tests may be done to  find the underlying cause of your anemia. These may include:  Testing for blood in the stool (fecal occult blood test).  A procedure to see inside the colon and rectum (colonoscopy).  A procedure to see inside the esophagus and stomach (endoscopy). TREATMENT  Iron deficiency anemia is treated by correcting the cause of the deficiency. Treatment may involve:  Adding iron-rich foods to your diet.  Taking iron supplements. Pregnant or breastfeeding women need to take extra iron because their normal diet usually does not provide the required amount.  Taking vitamins. Vitamin C improves the absorption of iron. Your health care provider may recommend that you take your iron tablets with a glass of orange juice or vitamin C supplement.  Medicines to make heavy menstrual flow lighter.  Surgery. HOME CARE INSTRUCTIONS   Take iron as directed by your health care provider.  If you cannot tolerate taking iron supplements by mouth, talk to your health care provider about taking them through a vein (intravenously) or an injection into a muscle.  For the best iron absorption, iron supplements should be taken on an empty stomach. If you cannot tolerate them on an empty stomach, you may need to take them with food.  Do not drink milk or take antacids at the same time as your iron supplements. Milk and antacids may interfere with the absorption of iron.  Iron supplements can cause constipation. Make sure to include fiber in your diet to prevent constipation. A stool softener may also be recommended.  Take vitamins as directed by your health care provider.  Eat a diet rich in iron. Foods high in iron include liver, lean beef, whole-grain bread, eggs, dried fruit, and dark green leafy vegetables. SEEK IMMEDIATE MEDICAL CARE IF:   You faint. If this happens, do not drive. Call your local emergency services (911 in [place] if no other help is available.  You have chest pain.  You feel nauseous or  vomit.  You have severe or increased shortness of breath with activity.  You feel weak.  You have a rapid heartbeat.  You have unexplained sweating.  You become light-headed when getting up from a chair or bed. MAKE SURE YOU:   Understand these instructions.  Will watch your condition.  Will get help right away if you are not doing well or get worse.   This information is not intended to replace advice given to you by your health care provider. Make sure you discuss any questions you have with your health care provider.   Document Released: 10/31/2000 Document Revised: 11/24/2014 Document Reviewed: 07/11/2013 Elsevier Interactive Patient Education [employer]

## 2015-10-18 NOTE — Progress Notes (Signed)
Subjective:     Patient ID: Judy Rodgers, female   DOB: [birth year], 19 y.o.   MRN: [id number]  HPI: 19 year old female whose main concerns today are that her periods are very irregular.  She has had a nexplanon since 2014 and at first was having a monthly period. Recently they have been more unpredictable and accompanied by cramping.  She takes Ibuprofen for cramps.  She also expresses frustration over chronic bacterial vaginosis that has been unresponsive to Metrogel and most recently Clindamycin.  She says the symptoms are annoying and embarrassing and have interfered with intimacy.   Judy LenisLaquanda is finishing her first semester at [employer].  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  She usually gets at least 8 hours of sleep a night.  Lately she has had low energy and fatigue.  She has a hx of anemia.   Review of Systems  Constitutional: Positive for fatigue. Negative for fever, activity change, appetite change and unexpected weight change.  HENT: Negative.   Respiratory: Negative.   Gastrointestinal: Negative.   Endocrine: Negative for cold intolerance.  Genitourinary: Positive for menstrual problem. Negative for dysuria and dyspareunia.       Objective:   Physical Exam  Constitutional: She appears well-developed and well-nourished.  Neck: No thyromegaly present.  Cardiovascular: Normal rate and normal heart sounds.   No murmur heard. Pulmonary/Chest: Effort normal and breath sounds normal.  Abdominal: Soft. There is no tenderness.  Lymphadenopathy:    She has no cervical adenopathy.  Skin: Skin is warm.  Nursing note and vitals reviewed.      Assessment:     Irregular menses Fatigue Anemia- iron deficiency vs blood loss Chronic BV     Plan:     TSH, POC Hgb  Refer to gynecologist for evaluation of BV- [facility] [place]  Rx per orders for Ferrous Sulfate (x3 months) and refill of Ibuprofen  Discussed transfer of care to an  adult practice.  Per Nathaniel ManInes, there are hardly any OB-GYN practices that are taking Medicaid patients.  Recheck Hgb in 3 months with PCP  Gregor HamsJacqueline Akin Yi, PPCNP-BC

## 2015-10-19 LAB — TSH: TSH: 0.526 u[IU]/mL (ref 0.350–4.500)

## 2015-10-22 ENCOUNTER — Encounter: Payer: Self-pay | Admitting: Family Medicine

## 2015-10-22 ENCOUNTER — Ambulatory Visit (INDEPENDENT_AMBULATORY_CARE_PROVIDER_SITE_OTHER): Payer: Federal, State, Local not specified - PPO | Admitting: Family Medicine

## 2015-10-22 VITALS — BP 119/74 | HR 96 | Resp 18 | Ht 63.0 in | Wt 140.0 lb

## 2015-10-22 DIAGNOSIS — A499 Bacterial infection, unspecified: Secondary | ICD-10-CM | POA: Diagnosis not present

## 2015-10-22 DIAGNOSIS — N760 Acute vaginitis: Secondary | ICD-10-CM

## 2015-10-22 DIAGNOSIS — B9689 Other specified bacterial agents as the cause of diseases classified elsewhere: Secondary | ICD-10-CM

## 2015-10-22 MED ORDER — HYLAFEM VA SUPP: 1.0000 | Freq: Every day | VAGINAL | Status: AC

## 2015-10-22 NOTE — Progress Notes (Signed)
Pt here today for referral for recurrent BV over the past year, has tried multiple treatments and states that intercourse has become uncomfortable and she experiences a burning sensation with intercourse.  Also c/o irregular menstrual cycles that started 4 months ago.

## 2015-10-22 NOTE — Progress Notes (Signed)
    Subjective:    Patient ID: Judy Rodgers is a 19 y.o. female presenting with Recurrent BV  on 10/22/2015  HPI: One year h/o BV recurrent. Notes fishy odor and symptoms. W/u only shows BV. She gets better with treatment x 1 wk, then it comes back again.  Notes it at times after intercourse. Notes pain with intercourse. Notes it more since Nexplanon has been put in.  Review of Systems  Constitutional: Negative for fever and chills.  Respiratory: Negative for shortness of breath.   Cardiovascular: Negative for chest pain.  Gastrointestinal: Negative for nausea, vomiting and abdominal pain.  Genitourinary: Negative for dysuria.  Skin: Negative for rash.      Objective:    BP 119/74 mmHg  Pulse 96  Resp 18  Ht 5\' 3"  (1.6 m)  Wt 140 lb (63.504 kg)  BMI 24.81 kg/m2  LMP 09/25/2015 (Exact Date) Physical Exam  Constitutional: She is oriented to person, place, and time. She appears well-developed and well-nourished. No distress.  HENT:  Head: Normocephalic and atraumatic.  Eyes: No scleral icterus.  Neck: Neck supple.  Cardiovascular: Normal rate.   Pulmonary/Chest: Effort normal.  Abdominal: Soft.  Genitourinary: Vagina normal. No vaginal discharge found.  Neurological: She is alert and oriented to person, place, and time.  Skin: Skin is warm and dry.  Psychiatric: She has a normal mood and affect.      Assessment & Plan:   Problem List Items Addressed This Visit    None    Visit Diagnoses    Vaginal bacteriosis    -  Primary    Recurrent--trial of Hylafem--advised of pH issues--may be related to Nexplanon--probiotics also.    Relevant Medications    Homeopathic Products (HYLAFEM) SUPP       Return in about 3 months (around 01/20/2016).  Ellice Boultinghouse S 10/22/2015 3:38 PM

## 2015-11-21 ENCOUNTER — Emergency Department (INDEPENDENT_AMBULATORY_CARE_PROVIDER_SITE_OTHER)
Admission: EM | Admit: 2015-11-21 | Discharge: 2015-11-21 | Disposition: A | Discharge status: Home / Self Care | Payer: Federal, State, Local not specified - PPO | Source: Home / Self Care

## 2015-11-21 ENCOUNTER — Encounter (HOSPITAL_COMMUNITY): Payer: Self-pay | Admitting: Emergency Medicine

## 2015-11-21 DIAGNOSIS — K529 Noninfective gastroenteritis and colitis, unspecified: Secondary | ICD-10-CM | POA: Diagnosis not present

## 2015-11-21 MED ORDER — LOPERAMIDE HCL 2 MG PO CAPS: 2.0000 mg | ORAL_CAPSULE | Freq: Four times a day (QID) | ORAL | Status: DC | PRN

## 2015-11-21 NOTE — ED Notes (Signed)
Pt has been suffering from vomiting and diarrhea since yesterday.  She denies any fever.

## 2015-11-21 NOTE — Discharge Instructions (Signed)
Colitis °Colitis is inflammation of the colon. Colitis may last a short time (acute) or it may last a long time (chronic). °CAUSES °This condition may be caused by: °· Viruses. °· Bacteria. °· Reactions to medicine. °· Certain autoimmune diseases, such as Crohn disease or ulcerative colitis. °SYMPTOMS °Symptoms of this condition include: °· Diarrhea. °· Passing bloody or tarry stool. °· Pain. °· Fever. °· Vomiting. °· Tiredness (fatigue). °· Weight loss. °· Bloating. °· Sudden increase in abdominal pain. °· Having fewer bowel movements than usual. °DIAGNOSIS °This condition is diagnosed with a stool test or a blood test. You may also have other tests, including X-rays, a CT scan, or a colonoscopy. °TREATMENT °Treatment may include: °· Resting the bowel. This involves not eating or drinking for a period of time. °· Fluids that are given through an IV tube. °· Medicine for pain and diarrhea. °· Antibiotic medicines. °· Cortisone medicines. °· Surgery. °HOME CARE INSTRUCTIONS °Eating and Drinking °· Follow instructions from your health care provider about eating or drinking restrictions. °· Drink enough fluid to keep your urine clear or pale yellow. °· Work with a dietitian to determine which foods cause your condition to flare up. °· Avoid foods that cause flare-ups. °· Eat a well-balanced diet. °Medicines °· Take over-the-counter and prescription medicines only as told by your health care provider. °· If you were prescribed an antibiotic medicine, take it as told by your health care provider. Do not stop taking the antibiotic even if you start to feel better. °General Instructions °· Keep all follow-up visits as told by your health care provider. This is important. °SEEK MEDICAL CARE IF: °· Your symptoms do not go away. °· You develop new symptoms. °SEEK IMMEDIATE MEDICAL CARE IF: °· You have a fever that does not go away with treatment. °· You develop chills. °· You have extreme weakness, fainting, or  dehydration. °· You have repeated vomiting. °· You develop severe pain in your abdomen. °· You pass bloody or tarry stool. °  °This information is not intended to replace advice given to you by your health care provider. Make sure you discuss any questions you have with your health care provider. °  °Document Released: 12/11/2004 Document Revised: 07/25/2015 Document Reviewed: 02/26/2015 °Elsevier Interactive Patient Education ©2016 Elsevier Inc. ° °Diarrhea °Diarrhea is frequent loose and watery bowel movements. It can cause you to feel weak and dehydrated. Dehydration can cause you to become tired and thirsty, have a dry mouth, and have decreased urination that often is dark yellow. Diarrhea is a sign of another problem, most often an infection that will not last long. In most cases, diarrhea typically lasts 2-3 days. However, it can last longer if it is a sign of something more serious. It is important to treat your diarrhea as directed by your caregiver to lessen or prevent future episodes of diarrhea. °CAUSES  °Some common causes include: °· Gastrointestinal infections caused by viruses, bacteria, or parasites. °· Food poisoning or food allergies. °· Certain medicines, such as antibiotics, chemotherapy, and laxatives. °· Artificial sweeteners and fructose. °· Digestive disorders. °HOME CARE INSTRUCTIONS °· Ensure adequate fluid intake (hydration): Have 1 cup (8 oz) of fluid for each diarrhea episode. Avoid fluids that contain simple sugars or sports drinks, fruit juices, whole milk products, and sodas. Your urine should be clear or pale yellow if you are drinking enough fluids. Hydrate with an oral rehydration solution that you can purchase at pharmacies, retail stores, and online. You can prepare an oral   rehydration solution at home by mixing the following ingredients together: °¨  - tsp table salt. °¨ ¾ tsp baking soda. °¨  tsp salt substitute containing potassium chloride. °¨ 1  tablespoons sugar. °¨ 1 L (34  oz) of water. °· Certain foods and beverages may increase the speed at which food moves through the gastrointestinal (GI) tract. These foods and beverages should be avoided and include: °¨ Caffeinated and alcoholic beverages. °¨ High-fiber foods, such as raw fruits and vegetables, nuts, seeds, and whole grain breads and cereals. °¨ Foods and beverages sweetened with sugar alcohols, such as xylitol, sorbitol, and mannitol. °· Some foods may be well tolerated and may help thicken stool including: °¨ Starchy foods, such as rice, toast, pasta, low-sugar cereal, oatmeal, grits, baked potatoes, crackers, and bagels. °¨ Bananas. °¨ Applesauce. °· Add probiotic-rich foods to help increase healthy bacteria in the GI tract, such as yogurt and fermented milk products. °· Wash your hands well after each diarrhea episode. °· Only take over-the-counter or prescription medicines as directed by your caregiver. °· Take a warm bath to relieve any burning or pain from frequent diarrhea episodes. °SEEK IMMEDIATE MEDICAL CARE IF:  °· You are unable to keep fluids down. °· You have persistent vomiting. °· You have blood in your stool, or your stools are black and tarry. °· You do not urinate in 6-8 hours, or there is only a small amount of very dark urine. °· You have abdominal pain that increases or localizes. °· You have weakness, dizziness, confusion, or light-headedness. °· You have a severe headache. °· Your diarrhea gets worse or does not get better. °· You have a fever or persistent symptoms for more than 2-3 days. °· You have a fever and your symptoms suddenly get worse. °MAKE SURE YOU:  °· Understand these instructions. °· Will watch your condition. °· Will get help right away if you are not doing well or get worse. °  °This information is not intended to replace advice given to you by your health care provider. Make sure you discuss any questions you have with your health care provider. °  °Document Released: 10/24/2002  Document Revised: 11/24/2014 Document Reviewed: 07/11/2012 °Elsevier Interactive Patient Education ©2016 Elsevier Inc. ° °

## 2015-11-21 NOTE — ED Provider Notes (Signed)
CSN: [id number]     Arrival date & time 11/21/15  1911 History   None    No chief complaint on file.  (Consider location/radiation/quality/duration/timing/severity/associated sxs/prior Treatment) HPI N/v/d for 3 days. realative's sick with similar symptoms that she was exposed to earlier in the week. Diarrhea 8-10 times a day. No vomiting since this morning. Able to tolerate fluids, not eating. No home treatment, missed work today ( had to leave early) Past Medical History  Diagnosis Date  . Environmental allergies   . MVHQIONG(295.2Headache(784.0)    Past Surgical History  Procedure Laterality Date  . Tonsillectomy    . Tonsillectomy    . Wisdom tooth extraction  09/2015   Family History  Problem Relation Age of Onset  . Depression Mother    Social History  Substance Use Topics  . Smoking status: Never Smoker   . Smokeless tobacco: Never Used  . Alcohol Use: No   OB History    Gravida Para Term Preterm AB TAB SAB Ectopic Multiple Living   0 0 0 0 0 0 0 0 0 0      Review of Systems ROS +'ve diarrhea  Denies: HEADACHE, NAUSEA, ABDOMINAL PAIN, CHEST PAIN, CONGESTION, DYSURIA, SHORTNESS OF BREATH  Allergies  Amoxicillin-pot clavulanate and Metronidazole  Home Medications   Prior to Admission medications   Medication Sig Start Date End Date Taking? Authorizing Provider  albuterol (PROVENTIL HFA;VENTOLIN HFA) 108 (90 BASE) MCG/ACT inhaler Inhale 2 puffs into the lungs every 4 (four) hours as needed for wheezing or shortness of breath. 05/10/15   Hannah Muthersbaugh, PA-C  cetirizine (ZYRTEC) 10 MG tablet Take 1 tablet (10 mg total) by mouth daily. Patient not taking: Reported on 10/18/2015 08/09/15   Voncille LoKate Ettefagh, MD  Etonogestrel [facility](IMPLANON [place]) Inject 1 application into the skin continuous. Implanted January 28, 2013    Historical Provider, MD  ferrous sulfate 325 (65 FE) MG tablet Take 1 tablet (325 mg total) by mouth daily with breakfast. 10/18/15   Gregor HamsJacqueline Tebben, NP  hydrOXYzine  (ATARAX/VISTARIL) 50 MG tablet Take 1 tablet (50 mg total) by mouth at bedtime. Patient not taking: Reported on 08/09/2015 09/27/14   Voncille LoKate Ettefagh, MD  ibuprofen (ADVIL,MOTRIN) 600 MG tablet Take 1 tablet (600 mg total) by mouth every 6 (six) hours as needed. 10/18/15   Gregor HamsJacqueline Tebben, NP  loperamide (IMODIUM) 2 MG capsule Take 1 capsule (2 mg total) by mouth 4 (four) times daily as needed for diarrhea or loose stools. 11/21/15   Tharon AquasFrank C Patrick, PA  mometasone (NASONEX) 50 MCG/ACT nasal spray Place 2 sprays into the nose daily. Patient not taking: Reported on 10/18/2015 05/10/15   Dahlia ClientHannah Muthersbaugh, PA-C  Olopatadine HCl 0.2 % SOLN Apply 1 drop to eye daily as needed (eye allergy symptoms). 08/09/15   Voncille LoKate Ettefagh, MD   Meds Ordered and Administered this Visit  Medications - No data to display  BP 139/73 mmHg  Pulse 84  Temp(Src) 98.8 F (37.1 C) (Oral)  Resp 18  SpO2 99% No data found.   Physical Exam NURSES NOTES AND VITAL SIGNS REVIEWED. CONSTITUTIONAL: Well developed, well nourished, no acute distress HEENT: normocephalic, atraumatic  Oral mucosa pink, moist EYES: Conjunctiva normal NECK:normal ROM, supple PULMONARY:No respiratory distress, normal effort, Lungs: CTAb/l CARDIOVASCULAR: RRR, no murmur ABDOMEN: soft, ND, NT, +'ve BS, No CVA t MUSCULOSKELETAL: Normal ROM of all extremities SKIN: warm and dry without rash PSYCHIATRIC: Mood and affect normal  ED Course  Procedures (including critical care time)  Labs Review Labs  Reviewed - No data to display  Imaging Review No results found.   Visual Acuity Review  Right Eye Distance:   Left Eye Distance:   Bilateral Distance:    Right Eye Near:   Left Eye Near:    Bilateral Near:         MDM   1. Gastroenteritis/enteritis    Patient is advised to continue home symptomatic treatment. Prescription for immodium sent pharmacy patient has indicated. Patient is advised that if there are new or worsening  symptoms or attend the emergency department, or contact primary care provider. Instructions of care provided discharged home in stable condition. Work note provided.  THIS NOTE WAS GENERATED USING A VOICE RECOGNITION SOFTWARE PROGRAM. ALL REASONABLE EFFORTS  WERE MADE TO PROOFREAD THIS DOCUMENT FOR ACCURACY.     Tharon Aquas, PA 11/21/15 2007

## 2015-12-11 ENCOUNTER — Telehealth: Payer: Self-pay | Admitting: *Deleted

## 2015-12-11 NOTE — Telephone Encounter (Signed)
-----   Message from Olevia Bowens sent at 12/11/2015 10:03 AM EST ----- Regarding: Rx Request Contact: [phone] Patient states Medicaid won't cover Hylafem, wants to know if she could be prescribed something different

## 2015-12-11 NOTE — Telephone Encounter (Signed)
Spoke to pt about prescription, she will pick up two samples and purchase one of the Hylafem at the pharmacy to do the 3 night treatment since Medicaid does not cover rx and see if that helps.

## 2015-12-11 NOTE — Telephone Encounter (Signed)
-----   Message from Jacinda S Battle sent at 12/11/2015 10:03 AM EST ----- Regarding: Rx Request Contact: [phone] Patient states Medicaid won't cover Hylafem, wants to know if she could be prescribed something different 

## 2016-01-07 ENCOUNTER — Emergency Department (INDEPENDENT_AMBULATORY_CARE_PROVIDER_SITE_OTHER): Payer: Federal, State, Local not specified - PPO

## 2016-01-07 ENCOUNTER — Emergency Department (INDEPENDENT_AMBULATORY_CARE_PROVIDER_SITE_OTHER)
Admission: EM | Admit: 2016-01-07 | Discharge: 2016-01-07 | Disposition: A | Discharge status: Home / Self Care | Payer: Federal, State, Local not specified - PPO | Source: Home / Self Care | Attending: Family Medicine | Admitting: Family Medicine

## 2016-01-07 ENCOUNTER — Encounter (HOSPITAL_COMMUNITY): Payer: Self-pay | Admitting: Emergency Medicine

## 2016-01-07 DIAGNOSIS — S93402A Sprain of unspecified ligament of left ankle, initial encounter: Secondary | ICD-10-CM | POA: Diagnosis not present

## 2016-01-07 DIAGNOSIS — W1839XA Other fall on same level, initial encounter: Secondary | ICD-10-CM

## 2016-01-07 DIAGNOSIS — W010XXA Fall on same level from slipping, tripping and stumbling without subsequent striking against object, initial encounter: Secondary | ICD-10-CM

## 2016-01-07 NOTE — ED Notes (Signed)
Left ankle pain after falling down 10 steps on Saturday.  Patient reports left shoulder soreness and left ankle pain.  No noted bruising, no scrapes. .  Patient has swelling and pain to lateral ankle and back of ankle.  Pulses 2 plus, able to wiggle toes.  Ice pack applied

## 2016-01-07 NOTE — ED Provider Notes (Signed)
CSN: [id number]     Arrival date & time 01/07/16  1303 History   First MD Initiated Contact with Patient 01/07/16 1346     Chief Complaint  Patient presents with  . Ankle Pain   (Consider location/radiation/quality/duration/timing/severity/associated sxs/prior Treatment) HPI Comments: 20 year old female states that she fell down some steps 2 days ago and injured her right ankle. She is complaining of pain primarily to the ankle and proximal foot. During this time she has been applying ice and taking Tylenol and Advil. She states the pain is worse today she has also been bearing partial weight walking from one place to another. She also states that while she was falling she was holding onto the rail with her left hand. She is complaining of minor soreness to the left shoulder. She demonstrates full range of motion of the left shoulder and states this is not a significant concern for her.   Past Medical History  Diagnosis Date  . Environmental allergies   . NWGNFAOZ(308.6)    Past Surgical History  Procedure Laterality Date  . Tonsillectomy    . Tonsillectomy    . Wisdom tooth extraction  09/2015   Family History  Problem Relation Age of Onset  . Depression Mother    Social History  Substance Use Topics  . Smoking status: Never Smoker   . Smokeless tobacco: Never Used  . Alcohol Use: No   OB History    Gravida Para Term Preterm AB TAB SAB Ectopic Multiple Living       Review of Systems  Constitutional: Negative for fever, chills and activity change.  HENT: Negative.   Respiratory: Negative.   Cardiovascular: Negative.   Musculoskeletal: Positive for arthralgias and gait problem. Negative for neck pain and neck stiffness.       As per HPI  Skin: Negative for color change, pallor and rash.  Neurological: Negative.     Allergies  Amoxicillin-pot clavulanate and Metronidazole  Home Medications   Prior to Admission medications   Medication Sig Start  Date End Date Taking? Authorizing Provider  albuterol (PROVENTIL HFA;VENTOLIN HFA) 108 (90 BASE) MCG/ACT inhaler Inhale 2 puffs into the lungs every 4 (four) hours as needed for wheezing or shortness of breath. 05/10/15   Hannah Muthersbaugh, PA-C  Etonogestrel (IMPLANON [place]) Inject 1 application into the skin continuous. Implanted January 28, 2013    Historical Provider, MD  ferrous sulfate 325 (65 FE) MG tablet Take 1 tablet (325 mg total) by mouth daily with breakfast. 10/18/15   Gregor Hams, NP  ibuprofen (ADVIL,MOTRIN) 600 MG tablet Take 1 tablet (600 mg total) by mouth every 6 (six) hours as needed. 10/18/15   Gregor Hams, NP  loperamide (IMODIUM) 2 MG capsule Take 1 capsule (2 mg total) by mouth 4 (four) times daily as needed for diarrhea or loose stools. 11/21/15   Tharon Aquas, PA  Olopatadine HCl 0.2 % SOLN Apply 1 drop to eye daily as needed (eye allergy symptoms). 08/09/15   Voncille Lo, MD   Meds Ordered and Administered this Visit  Medications - No data to display  BP 123/51 mmHg  Pulse 86  Temp(Src) 98.6 F (37 C) (Oral)  Resp 16  LMP 11/23/2015 No data found.   Physical Exam  Constitutional: She is oriented to person, place, and time. She appears well-developed and well-nourished. No distress.  HENT:  Head: Normocephalic and atraumatic.  Eyes: EOM are normal.  Neck: Normal range  of motion. Neck supple.  Cardiovascular: Normal rate and regular rhythm.   Pulmonary/Chest: Effort normal. No respiratory distress.  Musculoskeletal:  Left ankle and foot without apparent swelling or deformity. Dorsiflexion and plantarflexion intact. Eversion and inversion intact but with some pain. Light touch to the bony prominences of the ankle produces tenderness. Light touch to the Achilles tendon produces tenderness. There is no swelling, palpable or observed deformity of the Achilles tendon, the ankle or foot. Distal neurovascular and motor sensory is intact. Pedal pulses 2+.   Lymphadenopathy:    She has no cervical adenopathy.  Neurological: She is alert and oriented to person, place, and time. No cranial nerve deficit.  Skin: Skin is warm and dry.  Psychiatric: She has a normal mood and affect.  Nursing note and vitals reviewed.   ED Course  Procedures (including critical care time)  Labs Review Labs Reviewed - No data to display  Imaging Review Dg Ankle Complete Left  01/07/2016  CLINICAL DATA:  20 year old female with fall and left ankle pain. EXAM: LEFT ANKLE COMPLETE - 3+ VIEW COMPARISON:  None. FINDINGS: There is no evidence of fracture, dislocation, or joint effusion. There is no evidence of arthropathy or other focal bone abnormality. Soft tissues are unremarkable. IMPRESSION: Negative. Electronically Signed   By: Elgie Collard M.D.   On: 01/07/2016 14:41     Visual Acuity Review  Right Eye Distance:   Left Eye Distance:   Bilateral Distance:    Right Eye Near:   Left Eye Near:    Bilateral Near:         MDM   1. Ankle sprain, left, initial encounter   2. Fall from slip, trip, or stumble, initial encounter    Rest, ice elevation, limit weight bearing for 2-3 days or longer if still having pain. ASO splint    Hayden Rasmussen, NP 01/07/16 1501  Hayden Rasmussen, NP 01/07/16 [phone]

## 2016-01-07 NOTE — Discharge Instructions (Signed)
Ankle Sprain Rest, ice elevation, limit weight bearing for 2-3 days or longer if still having pain. An ankle sprain is an injury to the strong, fibrous tissues (ligaments) that hold the bones of your ankle joint together.  CAUSES An ankle sprain is usually caused by a fall or by twisting your ankle. Ankle sprains most commonly occur when you step on the outer edge of your foot, and your ankle turns inward. People who participate in sports are more prone to these types of injuries.  SYMPTOMS   Pain in your ankle. The pain may be present at rest or only when you are trying to stand or walk.  Swelling.  Bruising. Bruising may develop immediately or within 1 to 2 days after your injury.  Difficulty standing or walking, particularly when turning corners or changing directions. DIAGNOSIS  Your caregiver will ask you details about your injury and perform a physical exam of your ankle to determine if you have an ankle sprain. During the physical exam, your caregiver will press on and apply pressure to specific areas of your foot and ankle. Your caregiver will try to move your ankle in certain ways. An X-ray exam may be done to be sure a bone was not broken or a ligament did not separate from one of the bones in your ankle (avulsion fracture).  TREATMENT  Certain types of braces can help stabilize your ankle. Your caregiver can make a recommendation for this. Your caregiver may recommend the use of medicine for pain. If your sprain is severe, your caregiver may refer you to a surgeon who helps to restore function to parts of your skeletal system (orthopedist) or a physical therapist. HOME CARE INSTRUCTIONS   Apply ice to your injury for 1-2 days or as directed by your caregiver. Applying ice helps to reduce inflammation and pain.  Put ice in a plastic bag.  Place a towel between your skin and the bag.  Leave the ice on for 15-20 minutes at a time, every 2 hours while you are awake.  Only take  over-the-counter or prescription medicines for pain, discomfort, or fever as directed by your caregiver.  Elevate your injured ankle above the level of your heart as much as possible for 2-3 days.  If your caregiver recommends crutches, use them as instructed. Gradually put weight on the affected ankle. Continue to use crutches or a cane until you can walk without feeling pain in your ankle.  If you have a plaster splint, wear the splint as directed by your caregiver. Do not rest it on anything harder than a pillow for the first 24 hours. Do not put weight on it. Do not get it wet. You may take it off to take a shower or bath.  You may have been given an elastic bandage to wear around your ankle to provide support. If the elastic bandage is too tight (you have numbness or tingling in your foot or your foot becomes cold and blue), adjust the bandage to make it comfortable.  If you have an air splint, you may blow more air into it or let air out to make it more comfortable. You may take your splint off at night and before taking a shower or bath. Wiggle your toes in the splint several times per day to decrease swelling. SEEK MEDICAL CARE IF:   You have rapidly increasing bruising or swelling.  Your toes feel extremely cold or you lose feeling in your foot.  Your pain is not  relieved with medicine. SEEK IMMEDIATE MEDICAL CARE IF:  Your toes are numb or blue.  You have severe pain that is increasing. MAKE SURE YOU:   Understand these instructions.  Will watch your condition.  Will get help right away if you are not doing well or get worse.   This information is not intended to replace advice given to you by your health care provider. Make sure you discuss any questions you have with your health care provider.   Document Released: 11/03/2005 Document Revised: 11/24/2014 Document Reviewed: 11/15/2011 Elsevier Interactive Patient Education [employer].

## 2016-01-07 NOTE — ED Notes (Signed)
Ice pack provided to patient. 

## 2016-01-07 NOTE — ED Notes (Signed)
Reviewed work note 

## 2016-01-17 ENCOUNTER — Ambulatory Visit: Payer: Medicaid Other | Admitting: Pediatrics

## 2016-01-24 ENCOUNTER — Ambulatory Visit: Payer: Medicaid Other | Admitting: Family Medicine

## 2016-01-24 DIAGNOSIS — Z09 Encounter for follow-up examination after completed treatment for conditions other than malignant neoplasm: Secondary | ICD-10-CM

## 2016-02-28 ENCOUNTER — Ambulatory Visit (HOSPITAL_COMMUNITY)
Admission: EM | Admit: 2016-02-28 | Discharge: 2016-02-28 | Disposition: A | Discharge status: Home / Self Care | Payer: Federal, State, Local not specified - PPO | Attending: Family Medicine | Admitting: Family Medicine

## 2016-02-28 ENCOUNTER — Encounter (HOSPITAL_COMMUNITY): Payer: Self-pay | Admitting: Emergency Medicine

## 2016-02-28 DIAGNOSIS — J309 Allergic rhinitis, unspecified: Secondary | ICD-10-CM

## 2016-02-28 DIAGNOSIS — J302 Other seasonal allergic rhinitis: Secondary | ICD-10-CM

## 2016-02-28 MED ORDER — IPRATROPIUM BROMIDE 0.06 % NA SOLN: 2.0000 | Freq: Four times a day (QID) | NASAL | Status: DC

## 2016-02-28 MED ORDER — PREDNISONE 20 MG PO TABS: ORAL_TABLET | ORAL | Status: DC

## 2016-02-28 NOTE — ED Notes (Signed)
Body aches and chills, headache, "pressure" in top of head and face, runny nose and stuffy nose, coughing and sore throat.  Denies fever.  Symptoms started yesterday

## 2016-02-28 NOTE — Discharge Instructions (Signed)
Allergic Rhinitis TheraFlu is okay to take for your symptoms along with your prescription. He may also want to increase her fluid intake as well as ibuprofen or Tylenol for discomfort.  `Allergic rhinitis is when the mucous membranes in the nose respond to allergens. Allergens are particles in the air that cause your body to have an allergic reaction. This causes you to release allergic antibodies. Through a chain of events, these eventually cause you to release histamine into the blood stream. Although meant to protect the body, it is this release of histamine that causes your discomfort, such as frequent sneezing, congestion, and an itchy, runny nose.  CAUSES Seasonal allergic rhinitis (hay fever) is caused by pollen allergens that may come from grasses, trees, and weeds. Year-round allergic rhinitis (perennial allergic rhinitis) is caused by allergens such as house dust mites, pet dander, and mold spores. SYMPTOMS  Nasal stuffiness (congestion).  Itchy, runny nose with sneezing and tearing of the eyes. DIAGNOSIS Your health care provider can help you determine the allergen or allergens that trigger your symptoms. If you and your health care provider are unable to determine the allergen, skin or blood testing may be used. Your health care provider will diagnose your condition after taking your health history and performing a physical exam. Your health care provider may assess you for other related conditions, such as asthma, pink eye, or an ear infection. TREATMENT Allergic rhinitis does not have a cure, but it can be controlled by:  Medicines that block allergy symptoms. These may include allergy shots, nasal sprays, and oral antihistamines.  Avoiding the allergen. Hay fever may often be treated with antihistamines in pill or nasal spray forms. Antihistamines block the effects of histamine. There are over-the-counter medicines that may help with nasal congestion and swelling around the eyes.  Check with your health care provider before taking or giving this medicine. If avoiding the allergen or the medicine prescribed do not work, there are many new medicines your health care provider can prescribe. Stronger medicine may be used if initial measures are ineffective. Desensitizing injections can be used if medicine and avoidance does not work. Desensitization is when a patient is given ongoing shots until the body becomes less sensitive to the allergen. Make sure you follow up with your health care provider if problems continue. HOME CARE INSTRUCTIONS It is not possible to completely avoid allergens, but you can reduce your symptoms by taking steps to limit your exposure to them. It helps to know exactly what you are allergic to so that you can avoid your specific triggers. SEEK MEDICAL CARE IF:  You have a fever.  You develop a cough that does not stop easily (persistent).  You have shortness of breath.  You start wheezing.  Symptoms interfere with normal daily activities.   This information is not intended to replace advice given to you by your health care provider. Make sure you discuss any questions you have with your health care provider.   Document Released: 07/29/2001 Document Revised: 11/24/2014 Document Reviewed: 07/11/2013 Elsevier Interactive Patient Education [employer]2016 Else[employer] Inc[employer]

## 2016-02-28 NOTE — ED Provider Notes (Signed)
CSN: [id number]     Arrival date & time 02/28/16  1436 History   First MD Initiated Contact with Patient 02/28/16 1630     Chief Complaint  Patient presents with  . URI   (Consider location/radiation/quality/duration/timing/severity/associated sxs/prior Treatment) HPI Comments: 20 year old female with history of seasonal allergies presents with exacerbation of symptoms for the past 2 days. Complains of body aches, chills, pressure in the forehead and face particularly paranasal area, just feeling bad in general, runny nose and nasal congestion. She has just started taking TheraFlu and Allegra-D without relief. Denies fevers.  Patient is a 20 y.o. female presenting with URI.  URI Presenting symptoms: congestion and rhinorrhea   Presenting symptoms: no cough, no fatigue, no fever and no sore throat   Associated symptoms: no neck pain     Past Medical History  Diagnosis Date  . Environmental allergies   . IONGEXBM(841.3)    Past Surgical History  Procedure Laterality Date  . Tonsillectomy    . Tonsillectomy    . Wisdom tooth extraction  09/2015   Family History  Problem Relation Age of Onset  . Depression Mother    Social History  Substance Use Topics  . Smoking status: Never Smoker   . Smokeless tobacco: Never Used  . Alcohol Use: No   OB History    Gravida Para Term Preterm AB TAB SAB Ectopic Multiple Living       Review of Systems  Constitutional: Positive for chills. Negative for fever, activity change, appetite change and fatigue.  HENT: Positive for congestion, postnasal drip and rhinorrhea. Negative for facial swelling and sore throat.   Eyes: Negative.   Respiratory: Negative.  Negative for cough and shortness of breath.   Cardiovascular: Negative.   Musculoskeletal: Negative for neck pain and neck stiffness.  Skin: Negative for pallor and rash.  Neurological: Negative.     Allergies  Amoxicillin-pot clavulanate and Metronidazole  Home  Medications   Prior to Admission medications   Medication Sig Start Date End Date Taking? Authorizing Provider  Chlorphen-Pseudoephed-APAP [facility] FLU/COLD PO) Take by mouth.   Yes Historical Provider, MD  fexofenadine-pseudoephedrine (ALLEGRA-D 24) 180-240 MG 24 hr tablet Take 1 tablet by mouth daily.   Yes Historical Provider, MD  guaiFENesin (MUCINEX) 600 MG 12 hr tablet Take by mouth 2 (two) times daily.   Yes Historical Provider, MD  albuterol (PROVENTIL HFA;VENTOLIN HFA) 108 (90 BASE) MCG/ACT inhaler Inhale 2 puffs into the lungs every 4 (four) hours as needed for wheezing or shortness of breath. 05/10/15   Hannah Muthersbaugh, PA-C  Etonogestrel (IMPLANON [place]) Inject 1 application into the skin continuous. Implanted January 28, 2013    Historical Provider, MD  ferrous sulfate 325 (65 FE) MG tablet Take 1 tablet (325 mg total) by mouth daily with breakfast. 10/18/15   Gregor Hams, NP  ibuprofen (ADVIL,MOTRIN) 600 MG tablet Take 1 tablet (600 mg total) by mouth every 6 (six) hours as needed. 10/18/15   Gregor Hams, NP  ipratropium (ATROVENT) 0.06 % nasal spray Place 2 sprays into both nostrils 4 (four) times daily. 02/28/16   Hayden Rasmussen, NP  loperamide (IMODIUM) 2 MG capsule Take 1 capsule (2 mg total) by mouth 4 (four) times daily as needed for diarrhea or loose stools. 11/21/15   Tharon Aquas, PA  Olopatadine HCl 0.2 % SOLN Apply 1 drop to eye daily as needed (eye allergy symptoms). 08/09/15   Voncille Lo, MD  predniSONE (DELTASONE) 20 MG tablet Take 3 tabs po on first day, 2 tabs second day, 2 tabs third day, 1 tab fourth day, 1 tab 5th day. Take with food. 02/28/16   Hayden Rasmussenavid Denarius Sesler, NP   Meds Ordered and Administered this Visit  Medications - No data to display  BP 113/70 mmHg  Pulse 83  Temp(Src) 99.2 F (37.3 C) (Oral)  Resp 18  SpO2 100%  LMP 01/21/2016 No data found.   Physical Exam  Constitutional: She is oriented to person, place, and time. She appears well-developed  and well-nourished. No distress.  HENT:  Mouth/Throat: No oropharyngeal exudate.  Bilateral TMs are normal Oropharynx with clear PND, minor erythema and light cobblestoning.  Eyes: Conjunctivae and EOM are normal.  Neck: Normal range of motion. Neck supple.  Cardiovascular: Normal rate, regular rhythm and normal heart sounds.   Pulmonary/Chest: Effort normal and breath sounds normal. No respiratory distress. She has no wheezes. She has no rales.  Musculoskeletal: Normal range of motion. She exhibits no edema.  Lymphadenopathy:    She has no cervical adenopathy.  Neurological: She is alert and oriented to person, place, and time.  Skin: Skin is warm and dry. No rash noted.  Psychiatric: She has a normal mood and affect.  Nursing note and vitals reviewed.   ED Course  Procedures (including critical care time)  Labs Review Labs Reviewed - No data to display  Imaging Review No results found.   Visual Acuity Review  Right Eye Distance:   Left Eye Distance:   Bilateral Distance:    Right Eye Near:   Left Eye Near:    Bilateral Near:         MDM   1. Other seasonal allergic rhinitis   2. Allergic sinusitis    TheraFlu is okay to take for your symptoms along with your prescription. He may also want to increase her fluid intake as well as ibuprofen or Tylenol for discomfort. Meds ordered this encounter  Medications  . fexofenadine-pseudoephedrine (ALLEGRA-D 24) 180-240 MG 24 hr tablet    Sig: Take 1 tablet by mouth daily.  . Chlorphen-Pseudoephed-APAP (THERAFLU FLU/COLD PO)    Sig: Take by mouth.  Marland Kitchen. guaiFENesin (MUCINEX) 600 MG 12 hr tablet    Sig: Take by mouth 2 (two) times daily.  . predniSONE (DELTASONE) 20 MG tablet    Sig: Take 3 tabs po on first day, 2 tabs second day, 2 tabs third day, 1 tab fourth day, 1 tab 5th day. Take with food.    Dispense:  9 tablet    Refill:  0    Order Specific Question:  Supervising Provider    Answer:  Linna HoffKINDL, JAMES D [phone][[id number]]  .  ipratropium (ATROVENT) 0.06 % nasal spray    Sig: Place 2 sprays into both nostrils 4 (four) times daily.    Dispense:  15 mL    Refill:  0    Order Specific Question:  Supervising Provider    Answer:  Linna HoffKINDL, JAMES D [[id number]]   Stop the Tamsen RoersAllegra D    Avanni Turnbaugh, NP 02/28/16 1722

## 2016-04-25 ENCOUNTER — Telehealth: Payer: Self-pay | Admitting: Pediatrics

## 2016-04-25 DIAGNOSIS — Z8742 Personal history of other diseases of the female genital tract: Secondary | ICD-10-CM

## 2016-04-25 DIAGNOSIS — B9689 Other specified bacterial agents as the cause of diseases classified elsewhere: Secondary | ICD-10-CM

## 2016-04-25 DIAGNOSIS — N760 Acute vaginitis: Secondary | ICD-10-CM

## 2016-04-25 NOTE — Telephone Encounter (Signed)
I am unable to place a referral without knowing the reason for the referral.  I called and left a VM for the patient to call our office back regarding the reason for referral to Ob-Gyn.

## 2016-04-25 NOTE — Telephone Encounter (Signed)
Patient calling asking for a referral to OBGYN, [facility] and was told she needed a referral from PCP.  She is concerned that she will soon be 21 and medicaid she will not have medicaid.

## 2016-05-08 DIAGNOSIS — Z01419 Encounter for gynecological examination (general) (routine) without abnormal findings: Secondary | ICD-10-CM | POA: Diagnosis not present

## 2016-05-08 DIAGNOSIS — Z6827 Body mass index (BMI) 27.0-27.9, adult: Secondary | ICD-10-CM | POA: Diagnosis not present

## 2016-05-08 DIAGNOSIS — N899 Noninflammatory disorder of vagina, unspecified: Secondary | ICD-10-CM | POA: Diagnosis not present

## 2016-05-29 ENCOUNTER — Emergency Department (HOSPITAL_COMMUNITY)
Admission: EM | Admit: 2016-05-29 | Discharge: 2016-05-29 | Disposition: A | Discharge status: Home / Self Care | Payer: Federal, State, Local not specified - PPO | Attending: Emergency Medicine | Admitting: Emergency Medicine

## 2016-05-29 ENCOUNTER — Encounter (HOSPITAL_COMMUNITY): Payer: Self-pay | Admitting: Emergency Medicine

## 2016-05-29 DIAGNOSIS — M5410 Radiculopathy, site unspecified: Secondary | ICD-10-CM | POA: Insufficient documentation

## 2016-05-29 DIAGNOSIS — M62838 Other muscle spasm: Secondary | ICD-10-CM

## 2016-05-29 DIAGNOSIS — Z79899 Other long term (current) drug therapy: Secondary | ICD-10-CM | POA: Diagnosis not present

## 2016-05-29 DIAGNOSIS — R200 Anesthesia of skin: Secondary | ICD-10-CM | POA: Diagnosis present

## 2016-05-29 MED ORDER — NAPROXEN 375 MG PO TABS: 375.0000 mg | ORAL_TABLET | Freq: Two times a day (BID) | ORAL | Status: DC

## 2016-05-29 NOTE — Discharge Instructions (Signed)

## 2016-05-29 NOTE — ED Notes (Signed)
Patient alert and oriented at discharge.  Patient verbalized understanding of discharge instructions.  Patient ambulatory to the waiting room with this RN.

## 2016-05-29 NOTE — ED Notes (Signed)
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 Equal grips , no drift in arms.

## 2016-05-29 NOTE — ED Provider Notes (Signed)
CSN: [id number]     Arrival date & time 05/29/16  0920 History   First MD Initiated Contact with Patient 05/29/16 [phone]     Chief Complaint  Patient presents with  . Numbness    The history is provided by the patient. No language interpreter was used.   Len Kluver is a 20 y.o. female who presents to the Emergency Department complaining of numbness.  She has a 20 year old right handed woman that reports 1 week of intermittent tingling to her right forearm and hand. Pain has been increasing over the last week. No recent injury, no neck pain. She does repetitive motion for her job. She has some weakness when the numbness is present but no weakness with the numbness is gone. She does not notice any particular position that makes the numbness worse. She denies any recent illness, fevers, vomiting, diarrhea. No prior similar symptoms. She has a nexplanon.   Past Medical History  Diagnosis Date  . Environmental allergies   . LOVFIEPP(295.1)    Past Surgical History  Procedure Laterality Date  . Tonsillectomy    . Tonsillectomy    . Wisdom tooth extraction  09/2015   Family History  Problem Relation Age of Onset  . Depression Mother    Social History  Substance Use Topics  . Smoking status: Never Smoker   . Smokeless tobacco: Never Used  . Alcohol Use: No   OB History    Gravida Para Term Preterm AB TAB SAB Ectopic Multiple Living       Review of Systems  All other systems reviewed and are negative.     Allergies  Amoxicillin-pot clavulanate and Metronidazole  Home Medications   Prior to Admission medications   Medication Sig Start Date End Date Taking? Authorizing Provider  Chlorphen-Pseudoephed-APAP [facility] FLU/COLD PO) Take by mouth.   Yes Historical Provider, MD  ibuprofen (ADVIL,MOTRIN) 600 MG tablet Take 1 tablet (600 mg total) by mouth every 6 (six) hours as needed. 10/18/15  Yes Gregor Hams, NP  ipratropium (ATROVENT) 0.06 % nasal  spray Place 2 sprays into both nostrils 4 (four) times daily. 02/28/16  Yes Hayden Rasmussen, NP  loperamide (IMODIUM) 2 MG capsule Take 1 capsule (2 mg total) by mouth 4 (four) times daily as needed for diarrhea or loose stools. 11/21/15  Yes Tharon Aquas, PA  albuterol (PROVENTIL HFA;VENTOLIN HFA) 108 (90 BASE) MCG/ACT inhaler Inhale 2 puffs into the lungs every 4 (four) hours as needed for wheezing or shortness of breath. Patient not taking: Reported on 05/29/2016 05/10/15   Dahlia Client Muthersbaugh, PA-C  Etonogestrel (IMPLANON [place]) Inject 1 application into the skin continuous. Implanted January 28, 2013    Historical Provider, MD  fexofenadine-pseudoephedrine (ALLEGRA-D 24) 180-240 MG 24 hr tablet Take 1 tablet by mouth daily.    Historical Provider, MD  guaiFENesin (MUCINEX) 600 MG 12 hr tablet Take by mouth 2 (two) times daily.    Historical Provider, MD   BP 138/77 mmHg  Pulse 83  Temp(Src) 98.5 F (36.9 C) (Oral)  Resp 20  SpO2 100% Physical Exam  Constitutional: She is oriented to person, place, and time. She appears well-developed and well-nourished.  HENT:  Head: Normocephalic and atraumatic.  Cardiovascular: Normal rate and regular rhythm.   No murmur heard. Pulmonary/Chest: Effort normal and breath sounds normal. No respiratory distress.  Musculoskeletal: She exhibits no edema.  2. Radial pulses bilaterally. There is tenderness over the right trapezius muscle  that reproduces a numbness sensation in the forearm and fingers when palpated. Full range of motion present in the shoulders, elbows, wrists, digits.  Neurological: She is alert and oriented to person, place, and time.  5 out of 5 grip strength in bilateral upper extremities. 5 out of 5 strength in all 4 extremities. Sensation to light touch intact throughout all 4 extremities. AIN, PIN intact.  Skin: Skin is warm and dry.  Psychiatric: She has a normal mood and affect. Her behavior is normal.  Nursing note and vitals reviewed.   ED  Course  Procedures (including critical care time) Labs Review Labs Reviewed - No data to display  Imaging Review No results found. I have personally reviewed and evaluated these images and lab results as part of my medical decision-making.   EKG Interpretation None      MDM   Final diagnoses:  Radiculopathy, unspecified spinal region  Trapezius muscle spasm    Patient here for evaluation of intermittent numbness in the right arm. Examination is consistent with the radiculopathy related to trapezius muscle spasm. Will treat with anti-inflammatories, rest, outpatient follow, return precautions. Presentation is not consistent with CVA, epidural abscess, dissection.   Tilden FossaElizabeth Ronnie Doo, MD 05/29/16 1019

## 2016-06-11 ENCOUNTER — Encounter: Payer: Self-pay | Admitting: Pediatrics

## 2016-06-12 ENCOUNTER — Encounter: Payer: Self-pay | Admitting: Pediatrics

## 2016-07-23 ENCOUNTER — Ambulatory Visit (INDEPENDENT_AMBULATORY_CARE_PROVIDER_SITE_OTHER): Payer: Federal, State, Local not specified - PPO | Admitting: Certified Nurse Midwife

## 2016-07-23 ENCOUNTER — Encounter: Payer: Self-pay | Admitting: Certified Nurse Midwife

## 2016-07-23 VITALS — BP 123/69 | HR 79 | Ht 62.5 in | Wt 151.0 lb

## 2016-07-23 DIAGNOSIS — Z113 Encounter for screening for infections with a predominantly sexual mode of transmission: Secondary | ICD-10-CM

## 2016-07-23 DIAGNOSIS — Z8742 Personal history of other diseases of the female genital tract: Secondary | ICD-10-CM

## 2016-07-23 DIAGNOSIS — B9689 Other specified bacterial agents as the cause of diseases classified elsewhere: Secondary | ICD-10-CM

## 2016-07-23 DIAGNOSIS — A499 Bacterial infection, unspecified: Secondary | ICD-10-CM | POA: Diagnosis not present

## 2016-07-23 DIAGNOSIS — Z0000 Encounter for general adult medical examination without abnormal findings: Secondary | ICD-10-CM | POA: Diagnosis not present

## 2016-07-23 DIAGNOSIS — D649 Anemia, unspecified: Secondary | ICD-10-CM | POA: Diagnosis not present

## 2016-07-23 DIAGNOSIS — N760 Acute vaginitis: Secondary | ICD-10-CM

## 2016-07-23 DIAGNOSIS — Z01419 Encounter for gynecological examination (general) (routine) without abnormal findings: Secondary | ICD-10-CM

## 2016-07-23 MED ORDER — CLINDAMYCIN PHOSPHATE 2 % VA CREA: 1.0000 | TOPICAL_CREAM | Freq: Every day | VAGINAL | 0 refills | Status: DC

## 2016-07-23 MED ORDER — DOXYCYCLINE HYCLATE 100 MG PO TBEC: 100.0000 mg | DELAYED_RELEASE_TABLET | Freq: Two times a day (BID) | ORAL | 0 refills | Status: DC

## 2016-07-23 MED ORDER — VITAFOL GUMMIES 3.33-0.333-34.8 MG PO CHEW: 3.0000 | CHEWABLE_TABLET | Freq: Every day | ORAL | 12 refills | Status: DC

## 2016-07-23 NOTE — Progress Notes (Signed)
Subjective:      Judy Rodgers is a 20 y.o. female here for a routine exam.  Current complaints: chronic BV off and on for several years.  Has been on Clindamycin off and on for several years.  Has been treated for PID about a year ago.  Symptoms include a fishy smell, and thick discharge.  Denies any douching. Is using dove white.  Is using condoms for contraception and Nexplanon.  States Nexplanon was placed almost 4 years ago.  Has periods on Nexplanon.       Personal health questionnaire:  Is patient Judy Rodgers, have a family history of breast and/or ovarian cancer: no Is there a family history of uterine cancer diagnosed at age < 4, gastrointestinal cancer, urinary tract cancer, family member who is a Personnel officer syndrome-associated carrier: no Is the patient overweight and hypertensive, family history of diabetes, personal history of gestational diabetes, preeclampsia or PCOS: no Is patient over 54, have PCOS,  family history of premature CHD under age 73, diabetes, smoke, have hypertension or peripheral artery disease:  yes At any time, has a partner hit, kicked or otherwise hurt or frightened you?: no Over the past 2 weeks, have you felt down, depressed or hopeless?: no Over the past 2 weeks, have you felt little interest or pleasure in doing things?:no   Gynecologic History Patient's last menstrual period was 06/22/2016. Contraception: coitus interruptus and Nexplanon Last Pap: N/A.  Last mammogram: N/A.   Obstetric History OB History  Gravida Para Term Preterm AB Living  0 0 0 0 0 0  SAB TAB Ectopic Multiple Live Births  0 0 0 0          Past Medical History:  Diagnosis Date  . Environmental allergies   . ZYSAYTKZ(601.0)     Past Surgical History:  Procedure Laterality Date  . TONSILLECTOMY    . TONSILLECTOMY    . WISDOM TOOTH EXTRACTION  09/2015     Current Outpatient Prescriptions:  .  Etonogestrel (IMPLANON ), Inject 1 application into the skin  continuous. Implanted January 28, 2013, Disp: , Rfl:  .  albuterol (PROVENTIL HFA;VENTOLIN HFA) 108 (90 BASE) MCG/ACT inhaler, Inhale 2 puffs into the lungs every 4 (four) hours as needed for wheezing or shortness of breath. (Patient not taking: Reported on 05/29/2016), Disp: 1 Inhaler, Rfl: 0 .  clindamycin (CLEOCIN) 2 % vaginal cream, Place 1 Applicatorful vaginally at bedtime., Disp: 40 g, Rfl: 0 .  doxycycline (DORYX) 100 MG EC tablet, Take 1 tablet (100 mg total) by mouth 2 (two) times daily., Disp: 14 tablet, Rfl: 0 .  fexofenadine-pseudoephedrine (ALLEGRA-D 24) 180-240 MG 24 hr tablet, Take 1 tablet by mouth daily., Disp: , Rfl:  .  guaiFENesin (MUCINEX) 600 MG 12 hr tablet, Take by mouth 2 (two) times daily., Disp: , Rfl:  .  ipratropium (ATROVENT) 0.06 % nasal spray, Place 2 sprays into both nostrils 4 (four) times daily. (Patient not taking: Reported on 07/23/2016), Disp: 15 mL, Rfl: 0 .  loperamide (IMODIUM) 2 MG capsule, Take 1 capsule (2 mg total) by mouth 4 (four) times daily as needed for diarrhea or loose stools. (Patient not taking: Reported on 07/23/2016), Disp: 12 capsule, Rfl: 0 .  naproxen (NAPROSYN) 375 MG tablet, Take 1 tablet (375 mg total) by mouth 2 (two) times daily. (Patient not taking: Reported on 07/23/2016), Disp: 20 tablet, Rfl: 0 .  Prenatal Vit-Fe Phos-FA-Omega (VITAFOL GUMMIES) 3.33-0.333-34.8 MG CHEW, Chew 3 tablets by mouth daily., Disp: 90  tablet, Rfl: 12 Allergies  Allergen Reactions  . Amoxicillin-Pot Clavulanate Nausea And Vomiting and Other (See Comments)    vertigo  . Metronidazole Nausea And Vomiting and Rash    Nausea - tablets, rash - gel    Social History  Substance Use Topics  . Smoking status: Never Smoker  . Smokeless tobacco: Never Used  . Alcohol use No    Family History  Problem Relation Age of Onset  . Depression Mother       Review of Systems  Constitutional: negative for fatigue and weight loss Respiratory: negative for cough and  wheezing Cardiovascular: negative for chest pain, fatigue and palpitations Gastrointestinal: negative for abdominal pain and change in bowel habits Musculoskeletal:negative for myalgias Neurological: negative for gait problems and tremors Behavioral/Psych: negative for abusive relationship, depression Endocrine: negative for temperature intolerance   Genitourinary:negative for abnormal menstrual periods, genital lesions, hot flashes, sexual problems and + vaginal discharge with odor Integument/breast: negative for breast lump, breast tenderness, nipple discharge and skin lesion(s)    Objective:       BP 123/69   Pulse 79   Ht 5' 2.5" (1.588 m)   Wt 151 lb (68.5 kg)   LMP 06/22/2016   BMI 27.18 kg/m  General:   alert  Skin:   no rash or abnormalities  Lungs:   clear to auscultation bilaterally  Heart:   regular rate and rhythm, S1, S2 normal, no murmur, click, rub or gallop  Breasts:   normal without suspicious masses, skin or nipple changes or axillary nodes  Abdomen:  normal findings: no organomegaly, soft, non-tender and no hernia  Pelvis:  External genitalia: normal general appearance Urinary system: urethral meatus normal and bladder without fullness, nontender Vaginal: normal without tenderness, induration or masses, + thin gray vaginal discharge Cervix: normal appearance, no CMT, anterior Adnexa: normal bimanual exam Uterus: anteverted and non-tender, normal size   Lab Review Urine pregnancy test Labs reviewed yes Radiologic studies reviewed no  50% of 30 min visit spent on counseling and coordination of care.   Assessment:    Healthy female exam.   Chronic BV  STD screening exam  H/O PID Plan:   Rx: boric acid vaginal supp  Education reviewed: calcium supplements, depression evaluation, low fat, low cholesterol diet, safe sex/STD prevention, self breast exams, skin cancer screening and weight bearing exercise. Contraception: condoms and Nexplanon. Follow up  in: 1 week. Nexplanon removal and reinsertion  Meds ordered this encounter  Medications  . clindamycin (CLEOCIN) 2 % vaginal cream    Sig: Place 1 Applicatorful vaginally at bedtime.    Dispense:  40 g    Refill:  0  . Prenatal Vit-Fe Phos-FA-Omega (VITAFOL GUMMIES) 3.33-0.333-34.8 MG CHEW    Sig: Chew 3 tablets by mouth daily.    Dispense:  90 tablet    Refill:  12  . doxycycline (DORYX) 100 MG EC tablet    Sig: Take 1 tablet (100 mg total) by mouth 2 (two) times daily.    Dispense:  14 tablet    Refill:  0   Orders Placed This Encounter  Procedures  . CBC with Differential/Platelet  . Comprehensive metabolic panel  . TSH  . Hepatitis B surface antigen  . RPR  . Hepatitis C antibody  . HIV antibody   Need to obtain previous records Possible management options include: IUD Follow up as needed.

## 2016-07-24 ENCOUNTER — Other Ambulatory Visit: Payer: Self-pay | Admitting: Certified Nurse Midwife

## 2016-07-24 ENCOUNTER — Telehealth: Payer: Self-pay | Admitting: *Deleted

## 2016-07-24 DIAGNOSIS — D509 Iron deficiency anemia, unspecified: Secondary | ICD-10-CM

## 2016-07-24 LAB — TSH: TSH: 0.839 u[IU]/mL (ref 0.450–4.500)

## 2016-07-24 LAB — COMPREHENSIVE METABOLIC PANEL
ALBUMIN: 4.4 g/dL (ref 3.5–5.5)
ALT: 6 IU/L (ref 0–32)
AST: 18 IU/L (ref 0–40)
Albumin/Globulin Ratio: 1.4 (ref 1.2–2.2)
Alkaline Phosphatase: 65 IU/L (ref 39–117)
BUN / CREAT RATIO: 13 (ref 9–23)
BUN: 9 mg/dL (ref 6–20)
Bilirubin Total: 0.2 mg/dL (ref 0.0–1.2)
CALCIUM: 9.4 mg/dL (ref 8.7–10.2)
CO2: 19 mmol/L (ref 18–29)
Chloride: 101 mmol/L (ref 96–106)
Creatinine, Ser: 0.69 mg/dL (ref 0.57–1.00)
GFR calc Af Amer: 145 mL/min/{1.73_m2} (ref >59)
GFR calc non Af Amer: 126 mL/min/{1.73_m2} (ref >59)
GLUCOSE: 55 mg/dL — AB (ref 65–99)
Globulin, Total: 3.1 g/dL (ref 1.5–4.5)
Potassium: 4.6 mmol/L (ref 3.5–5.2)
Sodium: 141 mmol/L (ref 134–144)
Total Protein: 7.5 g/dL (ref 6.0–8.5)

## 2016-07-24 LAB — CBC WITH DIFFERENTIAL/PLATELET
BASOS ABS: 0 10*3/uL (ref 0.0–0.2)
Basos: 0 %
EOS (ABSOLUTE): 0.1 10*3/uL (ref 0.0–0.4)
Eos: 2 %
Hematocrit: 29.9 % — ABNORMAL LOW (ref 34.0–46.6)
Hemoglobin: 8.5 g/dL — ABNORMAL LOW (ref 11.1–15.9)
Immature Grans (Abs): 0 10*3/uL (ref 0.0–0.1)
Immature Granulocytes: 0 %
LYMPHS: 30 %
Lymphocytes Absolute: 1.7 10*3/uL (ref 0.7–3.1)
MCH: 18.8 pg — ABNORMAL LOW (ref 26.6–33.0)
MCHC: 28.4 g/dL — ABNORMAL LOW (ref 31.5–35.7)
MCV: 66 fL — ABNORMAL LOW (ref 79–97)
Monocytes Absolute: 0.7 10*3/uL (ref 0.1–0.9)
Monocytes: 12 %
Neutrophils Absolute: 3.2 10*3/uL (ref 1.4–7.0)
Neutrophils: 56 %
Platelets: 376 10*3/uL (ref 150–379)
RBC: 4.53 x10E6/uL (ref 3.77–5.28)
RDW: 19.3 % — ABNORMAL HIGH (ref 12.3–15.4)
WBC: 5.6 10*3/uL (ref 3.4–10.8)

## 2016-07-24 LAB — HEPATITIS C ANTIBODY: Hep C Virus Ab: <0.1 s/co ratio (ref 0.0–0.9)

## 2016-07-24 LAB — HEPATITIS B SURFACE ANTIGEN: Hepatitis B Surface Ag: NEGATIVE

## 2016-07-24 LAB — RPR: RPR Ser Ql: NONREACTIVE

## 2016-07-24 LAB — HIV ANTIBODY (ROUTINE TESTING W REFLEX): HIV SCREEN 4TH GENERATION: NONREACTIVE

## 2016-07-24 MED ORDER — NIFEREX PO TABS: 1.0000 | ORAL_TABLET | Freq: Two times a day (BID) | ORAL | 5 refills | Status: DC

## 2016-07-24 NOTE — Telephone Encounter (Signed)
[facility] [address] called and her Doxycycline EC was changed to Doxycycline 100 mg po twice a day x 7 days per DunlapRachelle.

## 2016-07-24 NOTE — Telephone Encounter (Signed)
Patient made aware of low hgb and that an iron supplement to take along with her PNV was sent to her pharmacy.Encouraged to eat fresh fruits and vegetables and to drink at least 6-8 8 oz glasses of water a day.

## 2016-07-25 ENCOUNTER — Other Ambulatory Visit: Payer: Self-pay | Admitting: Certified Nurse Midwife

## 2016-07-25 DIAGNOSIS — D509 Iron deficiency anemia, unspecified: Secondary | ICD-10-CM

## 2016-07-25 LAB — IRON AND TIBC
Iron Saturation: 8 % — CL (ref 15–55)
Iron: 34 ug/dL (ref 27–159)
Total Iron Binding Capacity: 424 ug/dL (ref 250–450)
UIBC: 390 ug/dL (ref 131–425)

## 2016-07-25 LAB — SPECIMEN STATUS REPORT

## 2016-07-25 LAB — FERRITIN: FERRITIN: 6 ng/mL — AB (ref 15–150)

## 2016-07-29 ENCOUNTER — Other Ambulatory Visit: Payer: Self-pay | Admitting: Certified Nurse Midwife

## 2016-07-29 LAB — NUSWAB VG+, CANDIDA 6SP
Atopobium vaginae: HIGH Score — AB
BVAB 2: HIGH {score} — AB
CHLAMYDIA TRACHOMATIS, NAA: NEGATIVE
Candida albicans, NAA: NEGATIVE
Candida glabrata, NAA: NEGATIVE
Candida krusei, NAA: NEGATIVE
Candida lusitaniae, NAA: NEGATIVE
Candida parapsilosis, NAA: NEGATIVE
Candida tropicalis, NAA: NEGATIVE
Neisseria gonorrhoeae, NAA: NEGATIVE
Trich vag by NAA: NEGATIVE

## 2016-07-30 ENCOUNTER — Telehealth: Payer: Self-pay | Admitting: *Deleted

## 2016-07-30 NOTE — Telephone Encounter (Signed)
Patient is aware that she has a Hematology consult placed and to listen out for this call.Patient also made aware that her Iron was changed and now will cost her 3.25 with her insurance.

## 2016-08-13 ENCOUNTER — Encounter: Payer: Self-pay | Admitting: Certified Nurse Midwife

## 2016-08-13 ENCOUNTER — Ambulatory Visit (INDEPENDENT_AMBULATORY_CARE_PROVIDER_SITE_OTHER): Payer: Federal, State, Local not specified - PPO | Admitting: Certified Nurse Midwife

## 2016-08-13 VITALS — BP 114/71 | HR 69 | Wt 152.0 lb

## 2016-08-13 DIAGNOSIS — D509 Iron deficiency anemia, unspecified: Secondary | ICD-10-CM

## 2016-08-13 DIAGNOSIS — Z3049 Encounter for surveillance of other contraceptives: Secondary | ICD-10-CM | POA: Diagnosis not present

## 2016-08-13 NOTE — Progress Notes (Signed)
Procedure Note Removal of Nexplanon  Patient had Nexplanon inserted in unknown, pediatricians office?. Desires removal today.   Reviewed risk and benefits of procedure. Alternative options discussed Patient reported understanding and agreed to continue.   The patient's left arm was palpated and the implant device located. The area was prepped with Betadinex3. The distal end of the device was palpated and 1 cc of 1% lidocaine with epinephrine was injected. A 2 mm incision was made. Any fibrotic tissue was carefully dissected away using blunt and/or sharp dissection. Over the tip and the tip was exposed, grasped with forcep and removed intact. Steri-strips and a sterile dressing were applied to the incision.   And a bandage applied and the arm was wrapped with gauze bandage.  The patient tolerated well.  Instructions:  The patient was instructed to remove the dressing in 24 hours and that some bruising is to be expected.  She was advised to use over the counter analgesics as needed for any pain at the site.  She is to keep the area dry for 24 hours and to call if her hand or arm becomes cold, numb, or blue.  Return visit:  Return in PRN/annual exam Patient plans abstinence  Orvilla Cornwallachelle Denney CNM

## 2016-08-19 ENCOUNTER — Ambulatory Visit (HOSPITAL_COMMUNITY)
Admission: EM | Admit: 2016-08-19 | Discharge: 2016-08-19 | Disposition: A | Discharge status: Home / Self Care | Payer: Federal, State, Local not specified - PPO | Attending: Family Medicine | Admitting: Family Medicine

## 2016-08-19 ENCOUNTER — Encounter (HOSPITAL_COMMUNITY): Payer: Self-pay | Admitting: Emergency Medicine

## 2016-08-19 DIAGNOSIS — K529 Noninfective gastroenteritis and colitis, unspecified: Secondary | ICD-10-CM | POA: Diagnosis not present

## 2016-08-19 MED ORDER — ONDANSETRON HCL 4 MG PO TABS: 4.0000 mg | ORAL_TABLET | Freq: Four times a day (QID) | ORAL | 0 refills | Status: DC

## 2016-08-19 NOTE — ED Provider Notes (Signed)
CSN: [id number]     Arrival date & time 08/19/16  1211 History   First MD Initiated Contact with Patient 08/19/16 1257     Chief Complaint  Patient presents with  . Nausea   (Consider location/radiation/quality/duration/timing/severity/associated sxs/prior Treatment) HPI 20 y/o female with onset of nausea FOR A COUPLE DAYS. ASSOC WITH SOME VOMITING AND 5 EPISODES OF DIARRHEA.  Past Medical History:  Diagnosis Date  . Environmental allergies   . NFAOZHYQ(657.8)    Past Surgical History:  Procedure Laterality Date  . TONSILLECTOMY    . TONSILLECTOMY    . WISDOM TOOTH EXTRACTION  09/2015   Family History  Problem Relation Age of Onset  . Depression Mother    Social History  Substance Use Topics  . Smoking status: Never Smoker  . Smokeless tobacco: Never Used  . Alcohol use No   OB History    Gravida Para Term Preterm AB Living   0 0 0 0 0 0   SAB TAB Ectopic Multiple Live Births   0 0 0 0       Review of Systems  Denies: HEADACHE, ABDOMINAL PAIN, CHEST PAIN, CONGESTION, DYSURIA, SHORTNESS OF BREATH  Allergies  Amoxicillin-pot clavulanate and Metronidazole  Home Medications   Prior to Admission medications   Medication Sig Start Date End Date Taking? Authorizing Provider  albuterol (PROVENTIL HFA;VENTOLIN HFA) 108 (90 BASE) MCG/ACT inhaler Inhale 2 puffs into the lungs every 4 (four) hours as needed for wheezing or shortness of breath. Patient not taking: Reported on 08/13/2016 05/10/15   Dahlia Client Muthersbaugh, PA-C  clindamycin (CLEOCIN) 2 % vaginal cream Place 1 Applicatorful vaginally at bedtime. 07/23/16   Rachelle A Denney, CNM  doxycycline (DORYX) 100 MG EC tablet Take 1 tablet (100 mg total) by mouth 2 (two) times daily. 07/23/16   Rachelle A Denney, CNM  Etonogestrel (IMPLANON [place]) Inject 1 application into the skin continuous. Implanted January 28, 2013    Historical Provider, MD  fexofenadine-pseudoephedrine (ALLEGRA-D 24) 180-240 MG 24 hr tablet Take 1 tablet by mouth  daily.    Historical Provider, MD  guaiFENesin (MUCINEX) 600 MG 12 hr tablet Take by mouth 2 (two) times daily.    Historical Provider, MD  ipratropium (ATROVENT) 0.06 % nasal spray Place 2 sprays into both nostrils 4 (four) times daily. Patient not taking: Reported on 08/13/2016 02/28/16   Hayden Rasmussen, NP  Iron Combinations (NIFEREX) TABS Take 1 tablet by mouth 2 (two) times daily. 07/24/16   Rachelle A Denney, CNM  loperamide (IMODIUM) 2 MG capsule Take 1 capsule (2 mg total) by mouth 4 (four) times daily as needed for diarrhea or loose stools. Patient not taking: Reported on 07/23/2016 11/21/15   Tharon Aquas, PA  naproxen (NAPROSYN) 375 MG tablet Take 1 tablet (375 mg total) by mouth 2 (two) times daily. 05/29/16   Tilden Fossa, MD  Prenatal Vit-Fe Phos-FA-Omega (VITAFOL GUMMIES) 3.33-0.333-34.8 MG CHEW Chew 3 tablets by mouth daily. 07/23/16   Roe Coombs, CNM   Meds Ordered and Administered this Visit  Medications - No data to display  LMP 06/22/2016  No data found.   Physical Exam NURSES NOTES AND VITAL SIGNS REVIEWED. CONSTITUTIONAL: Well developed, well nourished, no acute distress HEENT: normocephalic, atraumatic EYES: Conjunctiva normal NECK:normal ROM, supple, no adenopathy PULMONARY:No respiratory distress, normal effort ABDOMINAL: Soft, ND, NT BS+, No CVAT MUSCULOSKELETAL: Normal ROM of all extremities,  SKIN: warm and dry without rash PSYCHIATRIC: Mood and affect, behavior are normal  Urgent Care Course  Clinical Course    Procedures (including critical care time)  Labs Review Labs Reviewed - No data to display  Imaging Review No results found.   Visual Acuity Review  Right Eye Distance:   Left Eye Distance:   Bilateral Distance:    Right Eye Near:   Left Eye Near:    Bilateral Near:         MDM   1. Gastroenteritis     Patient is reassured that there are no issues that require transfer to higher level of care at this time or additional  tests. Patient is advised to continue home symptomatic treatment. Patient is advised that if there are new or worsening symptoms to attend the emergency department, contact primary care provider, or return to UC. Instructions of care provided discharged home in stable condition.    THIS NOTE WAS GENERATED USING A VOICE RECOGNITION SOFTWARE PROGRAM. ALL REASONABLE EFFORTS  WERE MADE TO PROOFREAD THIS DOCUMENT FOR ACCURACY.  I have verbally reviewed the discharge instructions with the patient. A printed AVS was given to the patient.  All questions were answered prior to discharge.      Tharon AquasFrank C Patrick, PA 08/19/16 1453

## 2016-08-19 NOTE — ED Triage Notes (Signed)
[facility] complaint of N/V/D that started yesterday. The patient reported that she started a new prescription of doxycycline for a bacterial infection and stopped it two days ago because she "did not feel right." The patient reported the N/V/D started 2 days after stopping the antibiotic.

## 2016-09-12 ENCOUNTER — Telehealth: Payer: Self-pay | Admitting: Hematology

## 2016-09-12 ENCOUNTER — Encounter: Payer: Self-pay | Admitting: Hematology

## 2016-09-12 NOTE — Telephone Encounter (Signed)
Pt confimed appt, pt requested Mon or any time before 11 am, mailed pt letter, completed intake, in basket referring provider.

## 2016-10-13 ENCOUNTER — Encounter: Payer: Federal, State, Local not specified - PPO | Admitting: Hematology

## 2016-12-16 DIAGNOSIS — H538 Other visual disturbances: Secondary | ICD-10-CM | POA: Diagnosis not present

## 2016-12-16 DIAGNOSIS — H5210 Myopia, unspecified eye: Secondary | ICD-10-CM | POA: Diagnosis not present

## 2016-12-18 ENCOUNTER — Ambulatory Visit (INDEPENDENT_AMBULATORY_CARE_PROVIDER_SITE_OTHER): Payer: Federal, State, Local not specified - PPO | Admitting: Obstetrics and Gynecology

## 2016-12-18 ENCOUNTER — Other Ambulatory Visit (HOSPITAL_COMMUNITY)
Admission: RE | Admit: 2016-12-18 | Discharge: 2016-12-18 | Disposition: A | Discharge status: Home / Self Care | Payer: Federal, State, Local not specified - PPO | Source: Ambulatory Visit | Attending: Obstetrics and Gynecology | Admitting: Obstetrics and Gynecology

## 2016-12-18 VITALS — BP 119/76 | HR 87 | Wt 155.2 lb

## 2016-12-18 DIAGNOSIS — Z113 Encounter for screening for infections with a predominantly sexual mode of transmission: Secondary | ICD-10-CM

## 2016-12-18 DIAGNOSIS — N761 Subacute and chronic vaginitis: Secondary | ICD-10-CM

## 2016-12-18 NOTE — Progress Notes (Signed)
21 yo G0 presenting today for the evaluation of recurrent BV for the past 2 years. Patient reports at least a monthly or twice monthly infection with BV. She has treated herself with Flagyl, clindamycin, and Borax without improvement in symptoms. She states the symptoms would disappear for 3 days and return. She describes the presence of a non-pruritic white discharge with odor. This time, these symptoms have been present for 2 weeks. She states that she has ensured that there are no triggering environmental factors. She uses unscented mild detergent for sensitive skin. She washes with Dove soap. She is consuming more yogurt. She is mindful of the condom she uses. She is currently abstinent.  Past Medical History:  Diagnosis Date  . Environmental allergies   . JXBJYNWG(956.2Headache(784.0)    Past Surgical History:  Procedure Laterality Date  . TONSILLECTOMY    . TONSILLECTOMY    . WISDOM TOOTH EXTRACTION  09/2015   Family History  Problem Relation Age of Onset  . Depression Mother    Social History  Substance Use Topics  . Smoking status: Never Smoker  . Smokeless tobacco: Never Used  . Alcohol use No   ROS See pertinent in HPI  Blood pressure 119/76, pulse 87, weight 155 lb 3.2 oz (70.4 kg), last menstrual period 11/26/2016. GENERAL: Well-developed, well-nourished female in no acute distress.  ABDOMEN: Soft, nontender, nondistended.  PELVIC: Normal external female genitalia. Vagina is pink and rugated.  Normal discharge. Normal appearing cervix. Uterus is normal in size. No adnexal mass or tenderness. EXTREMITIES: No cyanosis, clubbing, or edema, 2+ distal pulses.  A/P 21 yo with chronic vaginitis - Wet prep collected - Patient will be notified of abnormal results and treated accordingly - Discussed chronic treatment with boric acid as a preventive measure. - RTC prn

## 2016-12-19 LAB — CERVICOVAGINAL ANCILLARY ONLY
Bacterial vaginitis: POSITIVE — AB
CANDIDA VAGINITIS: NEGATIVE
CHLAMYDIA, DNA PROBE: NEGATIVE
NEISSERIA GONORRHEA: NEGATIVE
TRICH (WINDOWPATH): NEGATIVE

## 2016-12-22 ENCOUNTER — Telehealth: Payer: Self-pay

## 2016-12-22 ENCOUNTER — Other Ambulatory Visit: Payer: Self-pay | Admitting: Obstetrics and Gynecology

## 2016-12-22 DIAGNOSIS — N760 Acute vaginitis: Principal | ICD-10-CM

## 2016-12-22 DIAGNOSIS — B9689 Other specified bacterial agents as the cause of diseases classified elsewhere: Secondary | ICD-10-CM

## 2016-12-22 MED ORDER — METRONIDAZOLE 0.75 % EX GEL: 1.0000 "application " | CUTANEOUS | 1 refills | Status: DC

## 2016-12-22 MED ORDER — CLINDAMYCIN PHOSPHATE 2 % VA CREA: TOPICAL_CREAM | VAGINAL | 4 refills | Status: DC

## 2016-12-22 MED ORDER — FLUCONAZOLE 150 MG PO TABS: 150.0000 mg | ORAL_TABLET | Freq: Once | ORAL | 6 refills | Status: AC

## 2016-12-22 NOTE — Telephone Encounter (Signed)
Pt aware of +BV results. Dr. Jolayne Pantheronstant prefers to rx Flagyl, but since she has a sensitivity, she sent Clindamycin gel for 7 days. Once 7 day txt is complete, Dr. Jolayne Pantheronstant requests pt to start preventive therapy by using Clindamycin twice wkly for the next 4-6 mos to prevent BV recurrence.   Pt aware since the preferred txt is Flagyl, we will attempt Flagyl for preventive therapy. Pt agrees to discontinue Flagyl immediately, if she can not tolerate it and start back the Clindamycin twice wkly. Informed pt Abx may cause yeast infection and is aware if she develops vaginal itching and/or cottage cheese like discharge, Diflucan has been sent to the pharmacy just in case.   Pt aware in addition to the medication, cont eating yogurt daily and monitoring laundry detergent, body soaps, and condoms brands as potential aggravating factors. Dr. Jolayne Pantheronstant understands this is frustrating but we're hoping with the preventative txt, we can provide her with a sx free interval. Pt aware if sx's improves, she can discontinue preventive txt after 4-6 mos.

## 2016-12-23 ENCOUNTER — Other Ambulatory Visit: Payer: Self-pay | Admitting: Obstetrics and Gynecology

## 2016-12-23 DIAGNOSIS — N760 Acute vaginitis: Principal | ICD-10-CM

## 2016-12-23 DIAGNOSIS — B9689 Other specified bacterial agents as the cause of diseases classified elsewhere: Secondary | ICD-10-CM

## 2016-12-23 MED ORDER — CLINDAMYCIN PHOSPHATE 2 % VA CREA: TOPICAL_CREAM | VAGINAL | 4 refills | Status: DC

## 2016-12-24 DIAGNOSIS — H5213 Myopia, bilateral: Secondary | ICD-10-CM | POA: Diagnosis not present

## 2017-01-14 ENCOUNTER — Ambulatory Visit (INDEPENDENT_AMBULATORY_CARE_PROVIDER_SITE_OTHER): Payer: Medicaid Other | Admitting: Pediatrics

## 2017-01-14 ENCOUNTER — Encounter: Payer: Self-pay | Admitting: Pediatrics

## 2017-01-14 VITALS — Temp 98.0°F | Wt 158.0 lb

## 2017-01-14 DIAGNOSIS — J309 Allergic rhinitis, unspecified: Secondary | ICD-10-CM

## 2017-01-14 DIAGNOSIS — J4520 Mild intermittent asthma, uncomplicated: Secondary | ICD-10-CM

## 2017-01-14 DIAGNOSIS — H1010 Acute atopic conjunctivitis, unspecified eye: Secondary | ICD-10-CM | POA: Diagnosis not present

## 2017-01-14 MED ORDER — OLOPATADINE HCL 0.1 % OP SOLN: 1.0000 [drp] | Freq: Two times a day (BID) | OPHTHALMIC | 12 refills | Status: DC

## 2017-01-14 MED ORDER — FLUTICASONE PROPIONATE 50 MCG/ACT NA SUSP: 2.0000 | Freq: Every day | NASAL | 12 refills | Status: DC

## 2017-01-14 MED ORDER — ALBUTEROL SULFATE HFA 108 (90 BASE) MCG/ACT IN AERS: 2.0000 | INHALATION_SPRAY | RESPIRATORY_TRACT | 1 refills | Status: DC | PRN

## 2017-01-14 NOTE — Progress Notes (Signed)
   Subjective:     Judy Rodgers, is a 21 y.o. female   History provider by patient No interpreter necessary.  Chief Complaint  Patient presents with  . Allergies    pt stated that she needs medication and had to be seen before getting them  . Medication Refill    HPI:  Sore throat, itchy/watery eyes and sneezing that started 1 week ago. Usually takes allegra D and flonase, allergy eye drops every day during allergy season. She needs refills because she is out.   Reports SOB, wheezing and chest tightness. Uses albuterol inhaler twice a day during allergy season. Currently out of albuterol inhaler.   Review of Systems  As per HPI  Patient's history was reviewed and updated as appropriate: allergies, current medications, past family history, past medical history, past social history, past surgical history and problem list.     Objective:     Temp 98 F (36.7 C)   Wt 158 lb (71.7 kg)   LMP 12/27/2016   BMI 28.44 kg/m   Physical Exam  Constitutional: She appears well-developed and well-nourished.  HENT:  Right Ear: External ear normal.  Left Ear: External ear normal.  Mouth/Throat: Oropharynx is clear and moist.  Nose with pale boggy turbinates  Eyes: Conjunctivae are normal.  Neck: Normal range of motion. Neck supple.  Cardiovascular: Normal rate, regular rhythm, normal heart sounds and intact distal pulses.   Pulmonary/Chest: Effort normal and breath sounds normal. She has no wheezes.  Musculoskeletal: Normal range of motion.  Skin: Skin is warm and dry. No rash noted.       Assessment & Plan:   Judy LenisLaquanda is a 21 year old female who presents with sore throat, itchy/watery eyes and sneezing x 1 week. She is allergic rhinoconjunctivitis and allergy induced asthma. Will refill prescriptions today and encourage patient to take allergy medications daily during allergy season.   1. Allergic rhinoconjunctivitis - olopatadine (PATANOL) 0.1 % ophthalmic solution; Place 1  drop into both eyes 2 (two) times daily.  Dispense: 5 mL; Refill: 12 - fluticasone (FLONASE) 50 MCG/ACT nasal spray; Place 2 sprays into both nostrils daily.  Dispense: 16 g; Refill: 12 - She will buy allegra D over the counter and use it daily throughout allergy season.   2. Mild intermittent extrinsic asthma without complication - albuterol (PROVENTIL HFA;VENTOLIN HFA) 108 (90 Base) MCG/ACT inhaler; Inhale 2 puffs into the lungs every 4 (four) hours as needed for wheezing or shortness of breath.  Dispense: 1 Inhaler; Refill: 1 Supportive care and return precautions reviewed.  Return if symptoms worsen or fail to improve.  Hollice Gongarshree Lonni Dirden, MD

## 2017-01-14 NOTE — Patient Instructions (Signed)
Community Resources   General Health / Clinics (Adults) [facility] (for Adults) through [facility]: [phone]  [facility]:   [phone]   [facility]:   [phone]  Health Department:  [phone]  [facility]:  [phone] / [phone]  [facility]:   [phone]  [facility]:   [phone]   

## 2017-01-20 ENCOUNTER — Ambulatory Visit (HOSPITAL_COMMUNITY)
Admission: EM | Admit: 2017-01-20 | Discharge: 2017-01-20 | Disposition: A | Discharge status: Home / Self Care | Payer: Federal, State, Local not specified - PPO | Attending: Internal Medicine | Admitting: Internal Medicine

## 2017-01-20 ENCOUNTER — Encounter (HOSPITAL_COMMUNITY): Payer: Self-pay | Admitting: Family Medicine

## 2017-01-20 DIAGNOSIS — Z3202 Encounter for pregnancy test, result negative: Secondary | ICD-10-CM | POA: Diagnosis not present

## 2017-01-20 DIAGNOSIS — S39012A Strain of muscle, fascia and tendon of lower back, initial encounter: Secondary | ICD-10-CM

## 2017-01-20 DIAGNOSIS — Z1389 Encounter for screening for other disorder: Secondary | ICD-10-CM

## 2017-01-20 DIAGNOSIS — K5901 Slow transit constipation: Secondary | ICD-10-CM | POA: Diagnosis not present

## 2017-01-20 LAB — POCT URINALYSIS DIP (DEVICE)
Bilirubin Urine: NEGATIVE
Glucose, UA: NEGATIVE mg/dL
HGB URINE DIPSTICK: NEGATIVE
Leukocytes, UA: NEGATIVE
NITRITE: NEGATIVE
PH: 6.5 (ref 5.0–8.0)
PROTEIN: NEGATIVE mg/dL
Specific Gravity, Urine: 1.025 (ref 1.005–1.030)
Urobilinogen, UA: 1 mg/dL (ref 0.0–1.0)

## 2017-01-20 LAB — POCT PREGNANCY, URINE: PREG TEST UR: NEGATIVE

## 2017-01-20 NOTE — ED Triage Notes (Signed)
Pt here for intermittent abd pain in the LUQ, back and lower abd area. Denies any urinary symptoms or vaginal complaints.

## 2017-01-20 NOTE — Discharge Instructions (Signed)
Increase fiber and amount of water in your diet. For constipation use the MiraLAX as directed. Start early this afternoon with the 3 glasses of liquid with the MiraLAX. If after 6-8 hours you have a result she may drink another one to 2 glasses.

## 2017-01-20 NOTE — ED Provider Notes (Signed)
CSN: [id number]     Arrival date & time 01/20/17  1302 History   First MD Initiated Contact with Patient 01/20/17 1431     Chief Complaint  Patient presents with  . Abdominal Pain  . Back Pain   (Consider location/radiation/quality/duration/timing/severity/associated sxs/prior Treatment) 21 year old female complaining of intermittent sharp pains for one week located left upper quadrant that shoots down to the left lower quadrant and goes across the abdomen. She also complains of bilateral low back pain, air a lumbar muscle to light pain. She states that she now recall the last time she had a bowel movement. She has a history of constipation. No nausea or vomiting. Preg test negative. Urinalysis positive for trace ketones only.      Past Medical History:  Diagnosis Date  . Environmental allergies   . WUJWJXBJ(478.2)    Past Surgical History:  Procedure Laterality Date  . TONSILLECTOMY    . TONSILLECTOMY    . WISDOM TOOTH EXTRACTION  09/2015   Family History  Problem Relation Age of Onset  . Depression Mother    Social History  Substance Use Topics  . Smoking status: Never Smoker  . Smokeless tobacco: Never Used  . Alcohol use No   OB History    Gravida Para Term Preterm AB Living   0 0 0 0 0 0   SAB TAB Ectopic Multiple Live Births   0 0 0 0       Review of Systems  Constitutional: Negative.   HENT: Negative.   Respiratory: Negative.   Cardiovascular: Negative for chest pain.  Gastrointestinal: Positive for abdominal pain and constipation. Negative for blood in stool, nausea and vomiting.  Genitourinary: Negative.  Negative for pelvic pain, vaginal bleeding and vaginal discharge.  Musculoskeletal: Positive for back pain.  Skin: Negative.   Neurological: Negative.   All other systems reviewed and are negative.   Allergies  Amoxicillin-pot clavulanate and Metronidazole  Home Medications   Prior to Admission medications   Medication Sig Start Date End Date  Taking? Authorizing Provider  albuterol (PROVENTIL HFA;VENTOLIN HFA) 108 (90 Base) MCG/ACT inhaler Inhale 2 puffs into the lungs every 4 (four) hours as needed for wheezing or shortness of breath. 01/14/17   Hollice Gong, MD  Etonogestrel (IMPLANON [place]) Inject 1 application into the skin continuous. Implanted January 28, 2013    Historical Provider, MD  fexofenadine-pseudoephedrine (ALLEGRA-D 24) 180-240 MG 24 hr tablet Take 1 tablet by mouth daily.    Historical Provider, MD  fluticasone (FLONASE) 50 MCG/ACT nasal spray Place 2 sprays into both nostrils daily. 01/14/17   Hollice Gong, MD  guaiFENesin (MUCINEX) 600 MG 12 hr tablet Take by mouth 2 (two) times daily.    Historical Provider, MD  olopatadine (PATANOL) 0.1 % ophthalmic solution Place 1 drop into both eyes 2 (two) times daily. 01/14/17   Hollice Gong, MD   Meds Ordered and Administered this Visit  Medications - No data to display  BP 124/56   Pulse 75   Temp 98.7 F (37.1 C)   Resp 18   LMP 12/27/2016   SpO2 100%  No data found.   Physical Exam  Constitutional: She is oriented to person, place, and time. She appears well-developed and well-nourished. No distress.  Neck: Normal range of motion. Neck supple.  Cardiovascular: Normal rate, regular rhythm, normal heart sounds and intact distal pulses.   Pulmonary/Chest: Effort normal.  Abdominal: Soft. Bowel sounds are normal. She exhibits no mass. There is no rebound and no  guarding. No hernia.  There is minor non-tense distention of the abdomen such that it protrudes ever so slightly but is equal bilaterally. There are no obvious lumps, bumps or asymmetry. Bowel sounds are normal. Percussion reveals hyperresonance or tympany in several areas. Palpation reveals tenderness to the left upper quadrant as well has across the entire lower abdomen. No rebound or guarding. The abdomen is soft. The patient allows for deep palpation. No peritoneal signs.  Musculoskeletal: Normal range  of motion. She exhibits no edema.  Tenderness to the low paralumbar musculature.  Neurological: She is alert and oriented to person, place, and time.  Skin: Skin is warm and dry.  Psychiatric: She has a normal mood and affect.  Nursing note and vitals reviewed.   Urgent Care Course     Procedures (including critical care time)  Labs Review Labs Reviewed  POCT URINALYSIS DIP (DEVICE) - Abnormal; Notable for the following:       Result Value   Ketones, ur TRACE (*)    All other components within normal limits  POCT PREGNANCY, URINE    Imaging Review No results found.   Visual Acuity Review  Right Eye Distance:   Left Eye Distance:   Bilateral Distance:    Right Eye Near:   Left Eye Near:    Bilateral Near:         MDM   1. Slow transit constipation   2. Strain of lumbar region, initial encounter    Increase fiber and amount of water in your diet. For constipation use the MiraLAX as directed. Start early this afternoon with the 3 glasses of liquid with the MiraLAX. If after 6-8 hours you have a result she may drink another one to 2 glasses.     Hayden Rasmussenavid Judy Ueda, NP 01/20/17 1451

## 2017-02-28 ENCOUNTER — Encounter (HOSPITAL_COMMUNITY): Payer: Self-pay | Admitting: Emergency Medicine

## 2017-02-28 ENCOUNTER — Emergency Department (HOSPITAL_COMMUNITY)
Admission: EM | Admit: 2017-02-28 | Discharge: 2017-02-28 | Disposition: A | Discharge status: Home / Self Care | Payer: Federal, State, Local not specified - PPO | Attending: Emergency Medicine | Admitting: Emergency Medicine

## 2017-02-28 DIAGNOSIS — R197 Diarrhea, unspecified: Secondary | ICD-10-CM | POA: Diagnosis not present

## 2017-02-28 DIAGNOSIS — R1012 Left upper quadrant pain: Secondary | ICD-10-CM | POA: Diagnosis present

## 2017-02-28 DIAGNOSIS — R1010 Upper abdominal pain, unspecified: Secondary | ICD-10-CM | POA: Diagnosis not present

## 2017-02-28 DIAGNOSIS — Z79899 Other long term (current) drug therapy: Secondary | ICD-10-CM | POA: Diagnosis not present

## 2017-02-28 DIAGNOSIS — R112 Nausea with vomiting, unspecified: Secondary | ICD-10-CM

## 2017-02-28 LAB — URINALYSIS, ROUTINE W REFLEX MICROSCOPIC
BILIRUBIN URINE: NEGATIVE
Glucose, UA: NEGATIVE mg/dL
KETONES UR: NEGATIVE mg/dL
Nitrite: NEGATIVE
Protein, ur: NEGATIVE mg/dL
Specific Gravity, Urine: 1.003 — ABNORMAL LOW (ref 1.005–1.030)
pH: 7 (ref 5.0–8.0)

## 2017-02-28 LAB — CBC WITH DIFFERENTIAL/PLATELET
BASOS ABS: 0 10*3/uL (ref 0.0–0.1)
Basophils Relative: 1 %
Eosinophils Absolute: 0.5 10*3/uL (ref 0.0–0.7)
Eosinophils Relative: 8 %
HEMATOCRIT: 28.6 % — AB (ref 36.0–46.0)
HEMOGLOBIN: 8.4 g/dL — AB (ref 12.0–15.0)
LYMPHS PCT: 23 %
Lymphs Abs: 1.3 10*3/uL (ref 0.7–4.0)
MCH: 19 pg — AB (ref 26.0–34.0)
MCHC: 29.4 g/dL — ABNORMAL LOW (ref 30.0–36.0)
MCV: 64.9 fL — AB (ref 78.0–100.0)
Monocytes Absolute: 0.5 10*3/uL (ref 0.1–1.0)
Monocytes Relative: 8 %
NEUTROS ABS: 3.4 10*3/uL (ref 1.7–7.7)
NEUTROS PCT: 60 %
Platelets: 330 10*3/uL (ref 150–400)
RBC: 4.41 MIL/uL (ref 3.87–5.11)
RDW: 19.5 % — ABNORMAL HIGH (ref 11.5–15.5)
WBC: 5.7 10*3/uL (ref 4.0–10.5)

## 2017-02-28 LAB — COMPREHENSIVE METABOLIC PANEL
ALBUMIN: 3.9 g/dL (ref 3.5–5.0)
ALT: 8 U/L — ABNORMAL LOW (ref 14–54)
ANION GAP: 6 (ref 5–15)
AST: 18 U/L (ref 15–41)
Alkaline Phosphatase: 59 U/L (ref 38–126)
BUN: 7 mg/dL (ref 6–20)
CHLORIDE: 104 mmol/L (ref 101–111)
CO2: 25 mmol/L (ref 22–32)
Calcium: 9.1 mg/dL (ref 8.9–10.3)
Creatinine, Ser: 0.73 mg/dL (ref 0.44–1.00)
GFR calc Af Amer: >60 mL/min (ref >60)
GFR calc non Af Amer: >60 mL/min (ref >60)
GLUCOSE: 96 mg/dL (ref 65–99)
Potassium: 3.6 mmol/L (ref 3.5–5.1)
SODIUM: 135 mmol/L (ref 135–145)
Total Bilirubin: 0.4 mg/dL (ref 0.3–1.2)
Total Protein: 7.2 g/dL (ref 6.5–8.1)

## 2017-02-28 LAB — LIPASE, BLOOD: LIPASE: 23 U/L (ref 11–51)

## 2017-02-28 LAB — I-STAT BETA HCG BLOOD, ED (MC, WL, AP ONLY): I-stat hCG, quantitative: <5 m[IU]/mL (ref <5)

## 2017-02-28 MED ORDER — SODIUM CHLORIDE 0.9 % IV BOLUS (SEPSIS)
1000.0000 mL | Freq: Once | INTRAVENOUS | Status: AC
  Administered 2017-02-28: 1000 mL via INTRAVENOUS

## 2017-02-28 MED ORDER — DICYCLOMINE HCL 10 MG PO CAPS
10.0000 mg | ORAL_CAPSULE | Freq: Three times a day (TID) | ORAL | 0 refills | Status: DC | PRN
Start: 2017-02-28 — End: 2017-08-12

## 2017-02-28 MED ORDER — DICYCLOMINE HCL 10 MG PO CAPS
10.0000 mg | ORAL_CAPSULE | Freq: Once | ORAL | Status: AC
  Administered 2017-02-28: 10 mg via ORAL
  Filled 2017-02-28: qty 1

## 2017-02-28 MED ORDER — ONDANSETRON 4 MG PO TBDP: 4.0000 mg | ORAL_TABLET | Freq: Three times a day (TID) | ORAL | 0 refills | Status: DC | PRN

## 2017-02-28 MED ORDER — ONDANSETRON HCL 4 MG/2ML IJ SOLN
4.0000 mg | Freq: Once | INTRAMUSCULAR | Status: AC
  Administered 2017-02-28: 4 mg via INTRAVENOUS
  Filled 2017-02-28: qty 2

## 2017-02-28 NOTE — ED Notes (Signed)
Patient was given a cup of ice water. 

## 2017-02-28 NOTE — ED Triage Notes (Signed)
Pt reports since 10pm last night she began having emesis that was back to back. Reports 3 times this morning. Around midnight she began having diarrhea as well, 3 times total. Pt reports left sided abdominal pain that is intermittent.

## 2017-02-28 NOTE — ED Provider Notes (Signed)
[facility]-EMERGENCY DEPT Provider Note   CSN: [id number] Arrival date & time: 02/28/17  0945     History   Chief Complaint Chief Complaint  Patient presents with  . Emesis  . Diarrhea  . Abdominal Pain    HPI Judy Rodgers is a 21 y.o. female.  21yo F who p/w vomiting, diarrhea, and abdominal pain. Pt began having intermittent, severe LUQ abdominal pain last night and since then has had multiple episodes of non-bloody, non-bilious vomiting as well as 4 episodes of diarrhea. She has been unable to keep water down. She had a h/o 1 week of constipation leading up to this episode. No recent travel or oral antibiotic use. No sick contacts. No vaginal symptoms or urinary symptoms. Currently her nausea is improved.   The history is provided by the patient.  Emesis   Associated symptoms include abdominal pain and diarrhea.  Diarrhea   Associated symptoms include abdominal pain and vomiting.  Abdominal Pain   Associated symptoms include diarrhea and vomiting.    Past Medical History:  Diagnosis Date  . Environmental allergies   . [id number](478.2)     Patient Active Problem List   Diagnosis Date Noted  . Insomnia 02/02/2014  . Abdominal pain, unspecified site 12/29/2013  . Anemia 09/14/2013  . BMI (body mass index), pediatric, 5% to less than 85% for age 30/15/2014  . Acne 08/30/2013  . Constipation 08/30/2013  . Irregular menses 08/30/2013  . Blurry vision 08/30/2013  . Tension headache 03/21/2013  . Migraine without aura and without status migrainosus, not intractable 03/21/2013    Past Surgical History:  Procedure Laterality Date  . TONSILLECTOMY    . TONSILLECTOMY    . WISDOM TOOTH EXTRACTION  09/2015    OB History    Gravida Para Term Preterm AB Living   0 0 0 0 0 0   SAB TAB Ectopic Multiple Live Births   0 0 0 0         Home Medications    Prior to Admission medications   Medication Sig Start Date End Date Taking? Authorizing Provider  albuterol  (PROVENTIL HFA;VENTOLIN HFA) 108 (90 Base) MCG/ACT inhaler Inhale 2 puffs into the lungs every 4 (four) hours as needed for wheezing or shortness of breath. 01/14/17  Yes Hollice Gong, MD  fexofenadine-pseudoephedrine (ALLEGRA-D 24) 180-240 MG 24 hr tablet Take 1 tablet by mouth daily.   Yes Historical Provider, MD  ibuprofen (ADVIL,MOTRIN) 200 MG tablet Take 200 mg by mouth every 6 (six) hours as needed for moderate pain.   Yes Historical Provider, MD  dicyclomine (BENTYL) 10 MG capsule Take 1 capsule (10 mg total) by mouth 3 (three) times daily as needed for spasms. 02/28/17   Laurence Spates, MD  fluticasone (FLONASE) 50 MCG/ACT nasal spray Place 2 sprays into both nostrils daily. Patient not taking: Reported on 02/28/2017 01/14/17   Hollice Gong, MD  olopatadine (PATANOL) 0.1 % ophthalmic solution Place 1 drop into both eyes 2 (two) times daily. Patient not taking: Reported on 02/28/2017 01/14/17   Hollice Gong, MD  ondansetron (ZOFRAN ODT) 4 MG disintegrating tablet Take 1 tablet (4 mg total) by mouth every 8 (eight) hours as needed for nausea or vomiting. 02/28/17   Laurence Spates, MD    Family History Family History  Problem Relation Age of Onset  . Depression Mother     Social History Social History  Substance Use Topics  . Smoking status: Never Smoker  . Smokeless tobacco: Never Used  .  Alcohol use No     Allergies   Penicillins; Amoxicillin-pot clavulanate; and Metronidazole   Review of Systems Review of Systems  Gastrointestinal: Positive for abdominal pain, diarrhea and vomiting.  All other systems reviewed and are negative.    Physical Exam Updated Vital Signs BP 107/60   Pulse 85   Temp 98.1 F (36.7 C) (Oral)   Resp 16   LMP 01/24/2017   SpO2 100%   Physical Exam  Constitutional: She is oriented to person, place, and time. She appears well-developed and well-nourished. No distress.  HENT:  Head: Normocephalic and atraumatic.  Moist mucous  membranes  Eyes: Conjunctivae are normal. Pupils are equal, round, and reactive to light.  Neck: Neck supple.  Cardiovascular: Normal rate, regular rhythm and normal heart sounds.   No murmur heard. Pulmonary/Chest: Effort normal and breath sounds normal.  Abdominal: Soft. Bowel sounds are normal. She exhibits no distension. There is no tenderness.  Musculoskeletal: She exhibits no edema.  Neurological: She is alert and oriented to person, place, and time.  Fluent speech  Skin: Skin is warm and dry.  Psychiatric: She has a normal mood and affect. Judgment normal.  Nursing note and vitals reviewed.    ED Treatments / Results  Labs (all labs ordered are listed, but only abnormal results are displayed) Labs Reviewed  COMPREHENSIVE METABOLIC PANEL - Abnormal; Notable for the following:       Result Value   ALT 8 (*)    All other components within normal limits  CBC WITH DIFFERENTIAL/PLATELET - Abnormal; Notable for the following:    Hemoglobin 8.4 (*)    HCT 28.6 (*)    MCV 64.9 (*)    MCH 19.0 (*)    MCHC 29.4 (*)    RDW 19.5 (*)    All other components within normal limits  URINALYSIS, ROUTINE W REFLEX MICROSCOPIC - Abnormal; Notable for the following:    Color, Urine STRAW (*)    APPearance HAZY (*)    Specific Gravity, Urine 1.003 (*)    Hgb urine dipstick SMALL (*)    Leukocytes, UA SMALL (*)    Bacteria, UA RARE (*)    Squamous Epithelial / LPF 6-30 (*)    All other components within normal limits  LIPASE, BLOOD  I-STAT BETA HCG BLOOD, ED ([facility], [facility], [facility] ONLY)    EKG  EKG Interpretation None       Radiology No results found.  Procedures Procedures (including critical care time)  Medications Ordered in ED Medications  sodium chloride 0.9 % bolus 1,000 mL (0 mLs Intravenous Stopped 02/28/17 1218)  ondansetron (ZOFRAN) injection 4 mg (4 mg Intravenous Given 02/28/17 0000)  dicyclomine (BENTYL) capsule 10 mg (10 mg Oral Given 02/28/17 1332)     Initial  Impression / Assessment and Plan / ED Course  I have reviewed the triage vital signs and the nursing notes.  Pertinent labs that were available during my care of the patient were reviewed by me and considered in my medical decision making (see chart for details).    PT w/ vomiting, diarrhea, and intermittent upper abd pain since last night. She was well appearing on exam w/ normal VS. No focal abd tenderness. Gave IVF and zofran. Obtained above labs which were unremarkable w/ exception of anemia c/w previous values. Instructed pt to begin iron supplementation.  She has been able to tolerate ice water here with no problems. She feels much better after receiving Bentyl. Given her clinical improvement, reassuring exam  and lab work I feel she is safe for discharge with supportive care. Have discussed dietary options and supportive treatment for diarrhea and provided with zofran and bentyl to use at home. Extensively reviewed return precautions including worsening abdominal pain, fever, or intractable vomiting. Patient voiced understanding and was discharged in satisfactory condition.  Final Clinical Impressions(s) / ED Diagnoses   Final diagnoses:  Nausea vomiting and diarrhea  Pain of upper abdomen    New Prescriptions Discharge Medication List as of 02/28/2017  2:12 PM    START taking these medications   Details  dicyclomine (BENTYL) 10 MG capsule Take 1 capsule (10 mg total) by mouth 3 (three) times daily as needed for spasms., Starting Sat 02/28/2017, Print    ondansetron (ZOFRAN ODT) 4 MG disintegrating tablet Take 1 tablet (4 mg total) by mouth every 8 (eight) hours as needed for nausea or vomiting., Starting Sat 02/28/2017, Print         Laurence Spates, MD 02/28/17 [id number]

## 2017-04-09 DIAGNOSIS — L700 Acne vulgaris: Secondary | ICD-10-CM | POA: Diagnosis not present

## 2017-04-09 DIAGNOSIS — L738 Other specified follicular disorders: Secondary | ICD-10-CM | POA: Diagnosis not present

## 2017-04-20 ENCOUNTER — Ambulatory Visit (INDEPENDENT_AMBULATORY_CARE_PROVIDER_SITE_OTHER): Payer: Federal, State, Local not specified - PPO | Admitting: Obstetrics

## 2017-04-20 ENCOUNTER — Encounter: Payer: Self-pay | Admitting: Obstetrics

## 2017-04-20 ENCOUNTER — Other Ambulatory Visit (HOSPITAL_COMMUNITY)
Admission: RE | Admit: 2017-04-20 | Discharge: 2017-04-20 | Disposition: A | Discharge status: Home / Self Care | Payer: Federal, State, Local not specified - PPO | Source: Ambulatory Visit | Attending: Obstetrics | Admitting: Obstetrics

## 2017-04-20 VITALS — BP 129/72 | HR 77 | Wt 153.0 lb

## 2017-04-20 DIAGNOSIS — Z113 Encounter for screening for infections with a predominantly sexual mode of transmission: Secondary | ICD-10-CM

## 2017-04-20 DIAGNOSIS — Z3169 Encounter for other general counseling and advice on procreation: Secondary | ICD-10-CM

## 2017-04-20 DIAGNOSIS — N760 Acute vaginitis: Secondary | ICD-10-CM

## 2017-04-20 DIAGNOSIS — Z01419 Encounter for gynecological examination (general) (routine) without abnormal findings: Secondary | ICD-10-CM | POA: Insufficient documentation

## 2017-04-20 DIAGNOSIS — Z124 Encounter for screening for malignant neoplasm of cervix: Secondary | ICD-10-CM | POA: Diagnosis not present

## 2017-04-20 DIAGNOSIS — N944 Primary dysmenorrhea: Secondary | ICD-10-CM

## 2017-04-20 DIAGNOSIS — B9689 Other specified bacterial agents as the cause of diseases classified elsewhere: Secondary | ICD-10-CM | POA: Diagnosis not present

## 2017-04-20 MED ORDER — PRENATAL PLUS DHA 7-0.4-100 MG PO TABS: 1.0000 | ORAL_TABLET | Freq: Every day | ORAL | 11 refills | Status: DC

## 2017-04-20 MED ORDER — CLINDAMYCIN PHOSPHATE (1 DOSE) 2 % VA CREA: 1.0000 | TOPICAL_CREAM | Freq: Every evening | VAGINAL | 2 refills | Status: DC | PRN

## 2017-04-20 MED ORDER — IBUPROFEN 800 MG PO TABS: 800.0000 mg | ORAL_TABLET | Freq: Three times a day (TID) | ORAL | 5 refills | Status: DC | PRN

## 2017-04-20 NOTE — Patient Instructions (Addendum)
Bacterial Vaginosis Bacterial vaginosis is a vaginal infection that occurs when the normal balance of bacteria in the vagina is disrupted. It results from an overgrowth of certain bacteria. This is the most common vaginal infection among women ages 15-44. Because bacterial vaginosis increases your risk for STIs (sexually transmitted infections), getting treated can help reduce your risk for chlamydia, gonorrhea, herpes, and HIV (human immunodeficiency virus). Treatment is also important for preventing complications in pregnant women, because this condition can cause an early (premature) delivery. What are the causes? This condition is caused by an increase in harmful bacteria that are normally present in small amounts in the vagina. However, the reason that the condition develops is not fully understood. What increases the risk? The following factors may make you more likely to develop this condition:  Having a new sexual partner or multiple sexual partners.  Having unprotected sex.  Douching.  Having an intrauterine device (IUD).  Smoking.  Drug and alcohol abuse.  Taking certain antibiotic medicines.  Being pregnant.  You cannot get bacterial vaginosis from toilet seats, bedding, swimming pools, or contact with objects around you. What are the signs or symptoms? Symptoms of this condition include:  Grey or white vaginal discharge. The discharge can also be watery or foamy.  A fish-like odor with discharge, especially after sexual intercourse or during menstruation.  Itching in and around the vagina.  Burning or pain with urination.  Some women with bacterial vaginosis have no signs or symptoms. How is this diagnosed? This condition is diagnosed based on:  Your medical history.  A physical exam of the vagina.  Testing a sample of vaginal fluid under a microscope to look for a large amount of bad bacteria or abnormal cells. Your health care provider may use a cotton swab  or a small wooden spatula to collect the sample.  How is this treated? This condition is treated with antibiotics. These may be given as a pill, a vaginal cream, or a medicine that is put into the vagina (suppository). If the condition comes back after treatment, a second round of antibiotics may be needed. Follow these instructions at home: Medicines  Take over-the-counter and prescription medicines only as told by your health care provider.  Take or use your antibiotic as told by your health care provider. Do not stop taking or using the antibiotic even if you start to feel better. General instructions  If you have a female sexual partner, tell her that you have a vaginal infection. She should see her health care provider and be treated if she has symptoms. If you have a female sexual partner, he does not need treatment.  During treatment: ? Avoid sexual activity until you finish treatment. ? Do not douche. ? Avoid alcohol as directed by your health care provider. ? Avoid breastfeeding as directed by your health care provider.  Drink enough water and fluids to keep your urine clear or pale yellow.  Keep the area around your vagina and rectum clean. ? Wash the area daily with warm water. ? Wipe yourself from front to back after using the toilet.  Keep all follow-up visits as told by your health care provider. This is important. How is this prevented?  Do not douche.  Wash the outside of your vagina with warm water only.  Use protection when having sex. This includes latex condoms and dental dams.  Limit how many sexual partners you have. To help prevent bacterial vaginosis, it is best to have sex with just   one partner (monogamous).  Make sure you and your sexual partner are tested for STIs.  Wear cotton or cotton-lined underwear.  Avoid wearing tight pants and pantyhose, especially during summer.  Limit the amount of alcohol that you drink.  Do not use any products that  contain nicotine or tobacco, such as cigarettes and e-cigarettes. If you need help quitting, ask your health care provider.  Do not use illegal drugs. Where to find more information:  Centers for Disease Control and Prevention: www.cdc.gov/std  American Sexual Health Association (ASHA): www.ashastd.org  [place] Department of Health and Human Services, Office on Women's Health: www.womenshealth.gov/ or https://www.womenshealth.gov/a-z-topics/bacterial-vaginosis Contact a health care provider if:  Your symptoms do not improve, even after treatment.  You have more discharge or pain when urinating.  You have a fever.  You have pain in your abdomen.  You have pain during sex.  You have vaginal bleeding between periods. Summary  Bacterial vaginosis is a vaginal infection that occurs when the normal balance of bacteria in the vagina is disrupted.  Because bacterial vaginosis increases your risk for STIs (sexually transmitted infections), getting treated can help reduce your risk for chlamydia, gonorrhea, herpes, and HIV (human immunodeficiency virus). Treatment is also important for preventing complications in pregnant women, because the condition can cause an early (premature) delivery.  This condition is treated with antibiotic medicines. These may be given as a pill, a vaginal cream, or a medicine that is put into the vagina (suppository). This information is not intended to replace advice given to you by your health care provider. Make sure you discuss any questions you have with your health care provider. Document Released: 11/03/2005 Document Revised: 07/19/2016 Document Reviewed: 07/19/2016 Elsevier Interactive Patient Education  2017 [employer] Inc[employer]  

## 2017-04-20 NOTE — Progress Notes (Signed)
Patient has some questions about fertility and BV. Patient wants pregnancy- she wants to review her medications.

## 2017-04-20 NOTE — Progress Notes (Addendum)
Subjective:        Judy DraftsLaquanda Rodgers is a 21 y.o. female here for a routine exam.  Current complaints: Recurrent BV.Marland Kitchen.    Personal health questionnaire:  Is patient [name], have a family history of breast and/or ovarian cancer: no Is there a family history of uterine cancer diagnosed at age < 9550, gastrointestinal cancer, urinary tract cancer, family member who is a Personnel officerLynch syndrome-associated carrier: no Is the patient overweight and hypertensive, family history of diabetes, personal history of gestational diabetes, preeclampsia or PCOS: no Is patient over 8255, have PCOS,  family history of premature CHD under age 21, diabetes, smoke, have hypertension or peripheral artery disease:  no At any time, has a partner hit, kicked or otherwise hurt or frightened you?: no Over the past 2 weeks, have you felt down, depressed or hopeless?: no Over the past 2 weeks, have you felt little interest or pleasure in doing things?:no   Gynecologic History Patient's last menstrual period was 04/01/2017 (exact date). Contraception: none Last Pap: n/a. Results were: n/a Last mammogram: n/a. Results were: n/a  Obstetric History OB History  Gravida Para Term Preterm AB Living  0 0 0 0 0 0  SAB TAB Ectopic Multiple Live Births  0 0 0 0          Past Medical History:  Diagnosis Date  . Acne   . Environmental allergies   . ZOXWRUEA(540.9Headache(784.0)     Past Surgical History:  Procedure Laterality Date  . TONSILLECTOMY    . TONSILLECTOMY    . WISDOM TOOTH EXTRACTION  09/2015     Current Outpatient Prescriptions:  .  albuterol (PROVENTIL HFA;VENTOLIN HFA) 108 (90 Base) MCG/ACT inhaler, Inhale 2 puffs into the lungs every 4 (four) hours as needed for wheezing or shortness of breath., Disp: 1 Inhaler, Rfl: 1 .  dicyclomine (BENTYL) 10 MG capsule, Take 1 capsule (10 mg total) by mouth 3 (three) times daily as needed for spasms., Disp: 8 capsule, Rfl: 0 .  doxycycline (VIBRA-TABS) 100 MG tablet, Take  100 mg by mouth 2 (two) times daily., Disp: , Rfl:  .  fexofenadine-pseudoephedrine (ALLEGRA-D 24) 180-240 MG 24 hr tablet, Take 1 tablet by mouth daily., Disp: , Rfl:  .  Clindamycin Phosphate, 1 Dose, vaginal cream, Apply 1 Applicatorful topically at bedtime as needed., Disp: 5.8 g, Rfl: 2 .  ibuprofen (ADVIL,MOTRIN) 800 MG tablet, Take 1 tablet (800 mg total) by mouth every 8 (eight) hours as needed., Disp: 30 tablet, Rfl: 5 .  Prenatal MV-Min-Fe Cbn-FA-DHA (PRENATAL PLUS DHA) 7-0.4-100 MG TABS, Take 1 tablet by mouth daily before breakfast., Disp: 30 each, Rfl: 11 Allergies  Allergen Reactions  . Penicillins Nausea And Vomiting  . Amoxicillin-Pot Clavulanate Nausea And Vomiting and Other (See Comments)    vertigo  . Metronidazole Nausea And Vomiting and Rash    Nausea - tablets, rash - gel    Social History  Substance Use Topics  . Smoking status: Never Smoker  . Smokeless tobacco: Never Used  . Alcohol use No    Family History  Problem Relation Age of Onset  . Depression Mother       Review of Systems  Constitutional: negative for fatigue and weight loss Respiratory: negative for cough and wheezing Cardiovascular: negative for chest pain, fatigue and palpitations Gastrointestinal: negative for abdominal pain and change in bowel habits Musculoskeletal:negative for myalgias Neurological: positive for HA's Behavioral/Psych: negative for abusive relationship, depression Endocrine: negative for temperature intolerance  Genitourinary:positive for painful periods Integument/breast: negative for breast lump, breast tenderness, nipple discharge and skin lesion(s)    Objective:       BP 129/72   Pulse 77   Wt 153 lb (69.4 kg)   LMP 04/01/2017 (Exact Date)   BMI 27.54 kg/m  General:   alert  Skin:   no rash or abnormalities  Lungs:   clear to auscultation bilaterally  Heart:   regular rate and rhythm, S1, S2 normal, no murmur, click, rub or gallop  Breasts:   normal  without suspicious masses, skin or nipple changes or axillary nodes  Abdomen:  normal findings: no organomegaly, soft, non-tender and no hernia  Pelvis:  External genitalia: normal general appearance Urinary system: urethral meatus normal and bladder without fullness, nontender Vaginal: normal without tenderness, induration or masses Cervix: normal appearance Adnexa: normal bimanual exam Uterus: anteverted and non-tender, normal size   Lab Review Urine pregnancy test Labs reviewed yes Radiologic studies reviewed no  50% of 20 min visit spent on counseling and coordination of care.    Assessment:    Healthy female exam.    Preconception Counseling and Advice  History of recurrent BV  Dysmenorrhea  Headaches    Plan:   PNV's Rx for folic acid neuro prophylaxis Clindesse dispensed and educational material also dispensed  Ibuprofen Rx for dysmenorrhea and HA's  Education reviewed: calcium supplements, depression evaluation, low fat, low cholesterol diet, safe sex/STD prevention, self breast exams and weight bearing exercise. Contraception: none. Follow up in: 1 year.   Meds ordered this encounter  Medications  . doxycycline (VIBRA-TABS) 100 MG tablet    Sig: Take 100 mg by mouth 2 (two) times daily.  . Prenatal MV-Min-Fe Cbn-FA-DHA (PRENATAL PLUS DHA) 7-0.4-100 MG TABS    Sig: Take 1 tablet by mouth daily before breakfast.    Dispense:  30 each    Refill:  11  . ibuprofen (ADVIL,MOTRIN) 800 MG tablet    Sig: Take 1 tablet (800 mg total) by mouth every 8 (eight) hours as needed.    Dispense:  30 tablet    Refill:  5  . Clindamycin Phosphate, 1 Dose, vaginal cream    Sig: Apply 1 Applicatorful topically at bedtime as needed.    Dispense:  5.8 g    Refill:  2   Orders Placed This Encounter  Procedures  . HIV antibody  . Hepatitis B surface antigen  . RPR  . Hepatitis C antibody     Patient ID: Judy Rodgers, female   DOB: 18-Sep-1996, 21 y.o.   MRN: [id number]

## 2017-04-21 ENCOUNTER — Other Ambulatory Visit: Payer: Self-pay | Admitting: *Deleted

## 2017-04-21 LAB — CERVICOVAGINAL ANCILLARY ONLY
Bacterial vaginitis: NEGATIVE
CHLAMYDIA, DNA PROBE: NEGATIVE
Candida vaginitis: NEGATIVE
NEISSERIA GONORRHEA: NEGATIVE
TRICH (WINDOWPATH): NEGATIVE

## 2017-04-21 LAB — HIV ANTIBODY (ROUTINE TESTING W REFLEX): HIV Screen 4th Generation wRfx: NONREACTIVE

## 2017-04-21 LAB — HEPATITIS B SURFACE ANTIGEN: HEP B S AG: NEGATIVE

## 2017-04-21 LAB — HEPATITIS C ANTIBODY: Hep C Virus Ab: <0.1 s/co ratio (ref 0.0–0.9)

## 2017-04-21 LAB — RPR: RPR: NONREACTIVE

## 2017-04-21 MED ORDER — PRENATAL 27-0.8 MG PO TABS: 1.0000 | ORAL_TABLET | Freq: Every day | ORAL | 11 refills | Status: DC

## 2017-04-21 NOTE — Progress Notes (Signed)
PNV sent to pharmacy. Previous PNV has been discontinued.

## 2017-04-23 LAB — CYTOLOGY - PAP: Diagnosis: NEGATIVE

## 2017-05-22 ENCOUNTER — Ambulatory Visit (HOSPITAL_COMMUNITY)
Admission: EM | Admit: 2017-05-22 | Discharge: 2017-05-22 | Disposition: A | Discharge status: Home / Self Care | Payer: Federal, State, Local not specified - PPO | Attending: Internal Medicine | Admitting: Internal Medicine

## 2017-05-22 ENCOUNTER — Encounter (HOSPITAL_COMMUNITY): Payer: Self-pay | Admitting: *Deleted

## 2017-05-22 DIAGNOSIS — R519 Headache, unspecified: Secondary | ICD-10-CM

## 2017-05-22 DIAGNOSIS — R110 Nausea: Secondary | ICD-10-CM

## 2017-05-22 DIAGNOSIS — H53149 Visual discomfort, unspecified: Secondary | ICD-10-CM | POA: Diagnosis not present

## 2017-05-22 DIAGNOSIS — R51 Headache: Secondary | ICD-10-CM

## 2017-05-22 MED ORDER — DEXAMETHASONE SODIUM PHOSPHATE 10 MG/ML IJ SOLN
10.0000 mg | Freq: Once | INTRAMUSCULAR | Status: AC
  Administered 2017-05-22: 10 mg via INTRAMUSCULAR

## 2017-05-22 MED ORDER — ONDANSETRON HCL 4 MG PO TABS: 4.0000 mg | ORAL_TABLET | Freq: Four times a day (QID) | ORAL | 0 refills | Status: DC

## 2017-05-22 MED ORDER — KETOROLAC TROMETHAMINE 60 MG/2ML IM SOLN
INTRAMUSCULAR | Status: AC
  Filled 2017-05-22: qty 2

## 2017-05-22 MED ORDER — METOCLOPRAMIDE HCL 5 MG/ML IJ SOLN
INTRAMUSCULAR | Status: AC
  Filled 2017-05-22: qty 2

## 2017-05-22 MED ORDER — METOCLOPRAMIDE HCL 5 MG/ML IJ SOLN
10.0000 mg | Freq: Once | INTRAMUSCULAR | Status: AC
  Administered 2017-05-22: 10 mg via INTRAMUSCULAR

## 2017-05-22 MED ORDER — KETOROLAC TROMETHAMINE 60 MG/2ML IM SOLN
60.0000 mg | Freq: Once | INTRAMUSCULAR | Status: AC
  Administered 2017-05-22: 60 mg via INTRAMUSCULAR

## 2017-05-22 MED ORDER — DEXAMETHASONE SODIUM PHOSPHATE 10 MG/ML IJ SOLN
INTRAMUSCULAR | Status: AC
  Filled 2017-05-22: qty 1

## 2017-05-22 MED ORDER — ASPIRIN-ACETAMINOPHEN-CAFFEINE 250-250-65 MG PO TABS: 1.0000 | ORAL_TABLET | Freq: Three times a day (TID) | ORAL | 0 refills | Status: DC | PRN

## 2017-05-22 NOTE — Discharge Instructions (Signed)
°  No Primary Care Doctor: °Call [facility] at  [phone] - they can help you locate a primary care doctor that  accepts your insurance, provides certain services, etc. °Physician Referral Service- [phone] ° ° ° °

## 2017-05-22 NOTE — ED Triage Notes (Signed)
Pt  Reports      Pain     frontal  Portion  Of  Head       Some  Nausea       Pt  Reports    Pain  Not  releived  By  advil  Pt    Reports    Light  And  Noise  Make symptoms  Worse         Pt reports    No  History  Of    Recent  Sinus   Symptoms      Sitting  Upright    On  Exam table  speakig  In  Complete   sentances

## 2017-05-22 NOTE — ED Provider Notes (Signed)
CSN: [id number]     Arrival date & time 05/22/17  1033 History   First MD Initiated Contact with Patient 05/22/17 1123     Chief Complaint  Patient presents with  . Headache   (Consider location/radiation/quality/duration/timing/severity/associated sxs/prior Treatment) HPI Judy Rodgers is a 21 y.o. female presenting to UC with c/o generalized headache for about 2-3 days with associated photophobia, phonophobia, and mild nausea.  HA is 8/10 at this time. Mild relief with Advil. No hx of migraines. She is working a new job that can be stressful at times. She has been sleeping well and eating and drinking well. No recent head trauma. No change in vision. No numbness or weakness in arms or legs.     Past Medical History:  Diagnosis Date  . Acne   . Environmental allergies   . [id number](478.2Headache(784.0)    Past Surgical History:  Procedure Laterality Date  . TONSILLECTOMY    . TONSILLECTOMY    . WISDOM TOOTH EXTRACTION  09/2015   Family History  Problem Relation Age of Onset  . Depression Mother    Social History  Substance Use Topics  . Smoking status: Never Smoker  . Smokeless tobacco: Never Used  . Alcohol use No   OB History    Gravida Para Term Preterm AB Living   0 0 0 0 0 0   SAB TAB Ectopic Multiple Live Births   0 0 0 0       Review of Systems  Constitutional: Negative for chills and fever.  HENT: Positive for congestion ( minimal). Negative for ear pain.   Eyes: Positive for photophobia. Negative for visual disturbance.  Gastrointestinal: Positive for nausea. Negative for diarrhea and vomiting.  Neurological: Positive for headaches. Negative for dizziness and light-headedness.    Allergies  Penicillins; Amoxicillin-pot clavulanate; and Metronidazole  Home Medications   Prior to Admission medications   Medication Sig Start Date End Date Taking? Authorizing Provider  albuterol (PROVENTIL HFA;VENTOLIN HFA) 108 (90 Base) MCG/ACT inhaler Inhale 2 puffs into the lungs  every 4 (four) hours as needed for wheezing or shortness of breath. 01/14/17   Hollice GongSawyer, Tarshree, MD  aspirin-acetaminophen-caffeine (EXCEDRIN MIGRAINE) [phone] MG tablet Take 1 tablet by mouth every 8 (eight) hours as needed for headache or migraine. 05/22/17   Lurene ShadowPhelps, Charlene Cowdrey O, PA-C  Clindamycin Phosphate, 1 Dose, vaginal cream Apply 1 Applicatorful topically at bedtime as needed. 04/20/17   Brock BadHarper, Charles A, MD  dicyclomine (BENTYL) 10 MG capsule Take 1 capsule (10 mg total) by mouth 3 (three) times daily as needed for spasms. 02/28/17   Little, Ambrose Finlandachel Morgan, MD  doxycycline (VIBRA-TABS) 100 MG tablet Take 100 mg by mouth 2 (two) times daily.    [provider]  fexofenadine-pseudoephedrine (ALLEGRA-D 24) 180-240 MG 24 hr tablet Take 1 tablet by mouth daily.    [provider]  ibuprofen (ADVIL,MOTRIN) 800 MG tablet Take 1 tablet (800 mg total) by mouth every 8 (eight) hours as needed. 04/20/17   Brock BadHarper, Charles A, MD  ondansetron (ZOFRAN) 4 MG tablet Take 1 tablet (4 mg total) by mouth every 6 (six) hours. 05/22/17   Lurene ShadowPhelps, Eben Choinski O, PA-C  Prenatal MV-Min-Fe Cbn-FA-DHA (PRENATAL PLUS DHA) 7-0.4-100 MG TABS Take 1 tablet by mouth daily before breakfast. 04/20/17   Brock BadHarper, Charles A, MD  Prenatal Vit-Fe Fumarate-FA (MULTIVITAMIN-PRENATAL) 27-0.8 MG TABS tablet Take 1 tablet by mouth daily at 12 noon. 04/21/17   Brock BadHarper, Charles A, MD   Meds Ordered and Administered this Visit  Medications  ketorolac (TORADOL) injection 60 mg (60 mg Intramuscular Given 05/22/17 1145)  dexamethasone (DECADRON) injection 10 mg (10 mg Intramuscular Given 05/22/17 1147)  metoCLOPramide (REGLAN) injection 10 mg (10 mg Intramuscular Given 05/22/17 1147)    BP 132/70 (BP Location: Right Arm)   Pulse 78   Temp 98.6 F (37 C) (Oral)   Resp 18   LMP 05/02/2017   SpO2 100%  No data found.   Physical Exam  Constitutional: She is oriented to person, place, and time. She appears well-developed and well-nourished. No  distress.  HENT:  Head: Normocephalic and atraumatic.  Right Ear: Tympanic membrane normal.  Left Ear: Tympanic membrane normal.  Nose: Nose normal.  Mouth/Throat: Uvula is midline, oropharynx is clear and moist and mucous membranes are normal.  Eyes: Conjunctivae and EOM are normal. Pupils are equal, round, and reactive to light. Right eye exhibits no discharge. Left eye exhibits no discharge. No scleral icterus.  Neck: Normal range of motion. Neck supple.  No nuchal rigidity or meningeal signs.   Cardiovascular: Normal rate and regular rhythm.   Pulmonary/Chest: Effort normal and breath sounds normal. No respiratory distress. She has no wheezes. She has no rales.  Musculoskeletal: Normal range of motion.  Neurological: She is alert and oriented to person, place, and time. No cranial nerve deficit.  Skin: Skin is warm and dry. She is not diaphoretic.  Psychiatric: She has a normal mood and affect. Her behavior is normal.  Nursing note and vitals reviewed.   Urgent Care Course     Procedures (including critical care time)  Labs Review Labs Reviewed - No data to display  Imaging Review No results found.    MDM   1. Generalized headache   2. Nausea without vomiting   3. Photophobia    Normal exam. Hx c/w migraine despite prior dx of migraines.  Tx in UC: Decadron 10mg  IM, Reglan 10mg  IM, and Toradol 60mg  IM  Pt allowed to rest in exam room for at least 30 minutes after medication given. HA improved from 8/10 to 4/10, feels comfortable being discharged home  Rx: Zofran and excedrin migraine F/u with PCP as needed.   Lurene Shadow, [place] 05/22/17 1408

## 2017-07-15 ENCOUNTER — Encounter (HOSPITAL_COMMUNITY): Payer: Self-pay | Admitting: Family Medicine

## 2017-07-15 ENCOUNTER — Ambulatory Visit (HOSPITAL_COMMUNITY)
Admission: EM | Admit: 2017-07-15 | Discharge: 2017-07-15 | Disposition: A | Discharge status: Home / Self Care | Payer: Federal, State, Local not specified - PPO | Attending: Family Medicine | Admitting: Family Medicine

## 2017-07-15 DIAGNOSIS — B3789 Other sites of candidiasis: Secondary | ICD-10-CM

## 2017-07-15 MED ORDER — FLUCONAZOLE 150 MG PO TABS: ORAL_TABLET | ORAL | 0 refills | Status: DC

## 2017-07-15 NOTE — ED Triage Notes (Signed)
Pt here for bumps to groin area. sts burning and irritation. sts recently got over a BV infection.

## 2017-07-15 NOTE — ED Provider Notes (Addendum)
[facility]    CSN: [id number] Arrival date & time: 07/15/17  1942     History   Chief Complaint Chief Complaint  Patient presents with  . Vaginitis    HPI Judy Rodgers is a 21 y.o. female.   21 year old female complaining of a rash in the groin weeks. It burns and itches. Denies vaginal pain or discharge. She has been applying a and D ointment without relief      Past Medical History:  Diagnosis Date  . Acne   . Environmental allergies   . VHQIONGE(952.8)     Patient Active Problem List   Diagnosis Date Noted  . Insomnia 02/02/2014  . Abdominal pain, unspecified site 12/29/2013  . Anemia 09/14/2013  . BMI (body mass index), pediatric, 5% to less than 85% for age 36/15/2014  . Acne 08/30/2013  . Constipation 08/30/2013  . Irregular menses 08/30/2013  . Blurry vision 08/30/2013  . Tension headache 03/21/2013  . Migraine without aura and without status migrainosus, not intractable 03/21/2013    Past Surgical History:  Procedure Laterality Date  . TONSILLECTOMY    . TONSILLECTOMY    . WISDOM TOOTH EXTRACTION  09/2015    OB History    Gravida Para Term Preterm AB Living   0 0 0 0 0 0   SAB TAB Ectopic Multiple Live Births   0 0 0 0         Home Medications    Prior to Admission medications   Medication Sig Start Date End Date Taking? Authorizing Provider  albuterol (PROVENTIL HFA;VENTOLIN HFA) 108 (90 Base) MCG/ACT inhaler Inhale 2 puffs into the lungs every 4 (four) hours as needed for wheezing or shortness of breath. 01/14/17   Hollice Gong, MD  aspirin-acetaminophen-caffeine (EXCEDRIN MIGRAINE) [phone] MG tablet Take 1 tablet by mouth every 8 (eight) hours as needed for headache or migraine. 05/22/17   Lurene Shadow, PA-C  Clindamycin Phosphate, 1 Dose, vaginal cream Apply 1 Applicatorful topically at bedtime as needed. 04/20/17   Brock Bad, MD  dicyclomine (BENTYL) 10 MG capsule Take 1 capsule (10 mg total) by mouth 3  (three) times daily as needed for spasms. 02/28/17   Little, Ambrose Finland, MD  doxycycline (VIBRA-TABS) 100 MG tablet Take 100 mg by mouth 2 (two) times daily.    [provider]  fexofenadine-pseudoephedrine (ALLEGRA-D 24) 180-240 MG 24 hr tablet Take 1 tablet by mouth daily.    [provider]  fluconazole (DIFLUCAN) 150 MG tablet 1 tablet by mouth every other day for 3 doses 07/15/17   Hayden Rasmussen, NP  ibuprofen (ADVIL,MOTRIN) 800 MG tablet Take 1 tablet (800 mg total) by mouth every 8 (eight) hours as needed. 04/20/17   Brock Bad, MD  ondansetron (ZOFRAN) 4 MG tablet Take 1 tablet (4 mg total) by mouth every 6 (six) hours. 05/22/17   Lurene Shadow, PA-C  Prenatal MV-Min-Fe Cbn-FA-DHA (PRENATAL PLUS DHA) 7-0.4-100 MG TABS Take 1 tablet by mouth daily before breakfast. 04/20/17   Brock Bad, MD  Prenatal Vit-Fe Fumarate-FA (MULTIVITAMIN-PRENATAL) 27-0.8 MG TABS tablet Take 1 tablet by mouth daily at 12 noon. 04/21/17   Brock Bad, MD    Family History Family History  Problem Relation Age of Onset  . Depression Mother     Social History Social History  Substance Use Topics  . Smoking status: Never Smoker  . Smokeless tobacco: Never Used  . Alcohol use No     Allergies  Penicillins; Amoxicillin-pot clavulanate; and Metronidazole   Review of Systems Review of Systems  Constitutional: Negative.   HENT: Negative.   Genitourinary: Negative for dysuria, hematuria, vaginal bleeding and vaginal discharge.  Skin: Positive for rash.  All other systems reviewed and are negative.    Physical Exam Triage Vital Signs ED Triage Vitals  Enc Vitals Group     BP 07/15/17 2022 121/73     Pulse Rate 07/15/17 2022 79     Resp 07/15/17 2022 18     Temp 07/15/17 2022 98.1 F (36.7 C)     Temp Source 07/15/17 2022 Oral     SpO2 07/15/17 2022 97 %     Weight --      Height --      Head Circumference --      Peak Flow --      Pain Score 07/15/17 2023 3      Pain Loc --      Pain Edu? --      Excl. in GC? --    No data found.   Updated Vital Signs BP 121/73   Pulse 79   Temp 98.1 F (36.7 C) (Oral)   Resp 18   LMP 07/08/2017   SpO2 97%   Visual Acuity Right Eye Distance:   Left Eye Distance:   Bilateral Distance:    Right Eye Near:   Left Eye Near:    Bilateral Near:     Physical Exam  Constitutional: She is oriented to person, place, and time. She appears well-developed and well-nourished. No distress.  Neck: Neck supple.  Genitourinary:  Genitourinary Comments: Stasha, lab tech present External review reveals an intertriginous, well marginated rash between skin folds of the bilateral groin, proximal buttock and perineum. No evidence of vaginal discharge. No evidence of a vaginal involvement.  Musculoskeletal: Normal range of motion. She exhibits no edema.  Neurological: She is alert and oriented to person, place, and time.  Skin: Skin is warm and dry.  Nursing note and vitals reviewed.    UC Treatments / Results  Labs (all labs ordered are listed, but only abnormal results are displayed) Labs Reviewed - No data to display  EKG  EKG Interpretation None       Radiology No results found.  Procedures Procedures (including critical care time)  Medications Ordered in UC Medications - No data to display   Initial Impression / Assessment and Plan / UC Course  I have reviewed the triage vital signs and the nursing notes.  Pertinent labs & imaging results that were available during my care of the patient were reviewed by me and considered in my medical decision making (see chart for details).     Take the Diflucan tablets as directed. Apply Monistat cream, comes in generic, to the area of the rash twice a day for 1-2 weeks until completely gone. Keep the area dry he may need to use a hair dryer especially after showering.   Final Clinical Impressions(s) / UC Diagnoses   Final diagnoses:  Candida rash of  groin    New Prescriptions New Prescriptions   FLUCONAZOLE (DIFLUCAN) 150 MG TABLET    1 tablet by mouth every other day for 3 doses     Controlled Substance Prescriptions [place] Controlled Substance Registry consulted? Not Applicable   Hayden RasmussenMabe, Yaneli Keithley, NP 07/15/17 2046    Hayden RasmussenMabe, Jazz Rogala, NP 07/15/17 2048

## 2017-07-15 NOTE — Discharge Instructions (Signed)
Take the Diflucan tablets as directed. Apply Monistat cream, comes in generic, to the area of the rash twice a day for 1-2 weeks until completely gone. Keep the area dry he may need to use a hair dryer especially after showering.

## 2017-07-29 ENCOUNTER — Ambulatory Visit: Payer: Federal, State, Local not specified - PPO | Admitting: Obstetrics

## 2017-08-12 ENCOUNTER — Encounter: Payer: Self-pay | Admitting: Obstetrics

## 2017-08-12 ENCOUNTER — Ambulatory Visit (INDEPENDENT_AMBULATORY_CARE_PROVIDER_SITE_OTHER): Payer: Federal, State, Local not specified - PPO | Admitting: Obstetrics

## 2017-08-12 VITALS — BP 126/70 | HR 91 | Wt 157.2 lb

## 2017-08-12 DIAGNOSIS — N760 Acute vaginitis: Secondary | ICD-10-CM | POA: Diagnosis not present

## 2017-08-12 DIAGNOSIS — Z113 Encounter for screening for infections with a predominantly sexual mode of transmission: Secondary | ICD-10-CM | POA: Diagnosis not present

## 2017-08-12 DIAGNOSIS — B373 Candidiasis of vulva and vagina: Secondary | ICD-10-CM

## 2017-08-12 DIAGNOSIS — B9689 Other specified bacterial agents as the cause of diseases classified elsewhere: Secondary | ICD-10-CM | POA: Diagnosis not present

## 2017-08-12 DIAGNOSIS — B3731 Acute candidiasis of vulva and vagina: Secondary | ICD-10-CM

## 2017-08-12 DIAGNOSIS — N944 Primary dysmenorrhea: Secondary | ICD-10-CM | POA: Diagnosis not present

## 2017-08-12 MED ORDER — CLINDAMYCIN PHOSPHATE (1 DOSE) 2 % VA CREA: 1.0000 | TOPICAL_CREAM | Freq: Every evening | VAGINAL | 3 refills | Status: DC | PRN

## 2017-08-12 MED ORDER — FLUCONAZOLE 150 MG PO TABS: 150.0000 mg | ORAL_TABLET | Freq: Once | ORAL | 0 refills | Status: AC

## 2017-08-12 MED ORDER — IBUPROFEN 800 MG PO TABS: 800.0000 mg | ORAL_TABLET | Freq: Three times a day (TID) | ORAL | 5 refills | Status: DC | PRN

## 2017-08-12 NOTE — Progress Notes (Signed)
Patient is complaining of chronic BV. She is also experiencing bumps in the vaginal area that itch and thet do get sore. Patient is requesting STD testing. She wants to discuss long term treatment for BV.

## 2017-08-13 ENCOUNTER — Encounter: Payer: Self-pay | Admitting: Obstetrics

## 2017-08-13 DIAGNOSIS — N760 Acute vaginitis: Principal | ICD-10-CM

## 2017-08-13 DIAGNOSIS — B9689 Other specified bacterial agents as the cause of diseases classified elsewhere: Secondary | ICD-10-CM | POA: Insufficient documentation

## 2017-08-13 LAB — CERVICOVAGINAL ANCILLARY ONLY
Bacterial vaginitis: NEGATIVE
CHLAMYDIA, DNA PROBE: NEGATIVE
Candida vaginitis: NEGATIVE
NEISSERIA GONORRHEA: NEGATIVE
TRICH (WINDOWPATH): NEGATIVE

## 2017-08-13 LAB — HEPATITIS B SURFACE ANTIGEN: HEP B S AG: NEGATIVE

## 2017-08-13 LAB — RPR: RPR Ser Ql: NONREACTIVE

## 2017-08-13 LAB — HEPATITIS C ANTIBODY: Hep C Virus Ab: <0.1 s/co ratio (ref 0.0–0.9)

## 2017-08-13 LAB — HIV ANTIBODY (ROUTINE TESTING W REFLEX): HIV SCREEN 4TH GENERATION: NONREACTIVE

## 2017-08-13 NOTE — Progress Notes (Signed)
Patient ID: Judy Rodgers, female   DOB: 1996/07/14, 21 y.o.   MRN: [id number]  Chief Complaint  Patient presents with  . Gynecologic Exam    HPI Judy Rodgers is a 21 y.o. female.  Requests treatment for recurrent BV. HPI  Past Medical History:  Diagnosis Date  . Acne   . Environmental allergies   . ONGEXBMW(413.2)     Past Surgical History:  Procedure Laterality Date  . TONSILLECTOMY    . TONSILLECTOMY    . WISDOM TOOTH EXTRACTION  09/2015    Family History  Problem Relation Age of Onset  . Depression Mother     Social History Social History  Substance Use Topics  . Smoking status: Never Smoker  . Smokeless tobacco: Never Used  . Alcohol use No    Allergies  Allergen Reactions  . Penicillins Nausea And Vomiting  . Amoxicillin-Pot Clavulanate Nausea And Vomiting and Other (See Comments)    vertigo  . Metronidazole Nausea And Vomiting and Rash    Nausea - tablets, rash - gel    Current Outpatient Prescriptions  Medication Sig Dispense Refill  . ibuprofen (ADVIL,MOTRIN) 800 MG tablet Take 1 tablet (800 mg total) by mouth every 8 (eight) hours as needed. 30 tablet 5  . Prenatal MV-Min-Fe Cbn-FA-DHA (PRENATAL PLUS DHA) 7-0.4-100 MG TABS Take 1 tablet by mouth daily before breakfast. 30 each 11  . albuterol (PROVENTIL HFA;VENTOLIN HFA) 108 (90 Base) MCG/ACT inhaler Inhale 2 puffs into the lungs every 4 (four) hours as needed for wheezing or shortness of breath. (Patient not taking: Reported on 08/12/2017) 1 Inhaler 1  . Clindamycin Phosphate, 1 Dose, vaginal cream Apply 1 Applicatorful topically at bedtime as needed. 5.8 g 3  . fexofenadine-pseudoephedrine (ALLEGRA-D 24) 180-240 MG 24 hr tablet Take 1 tablet by mouth daily.     No current facility-administered medications for this visit.     Review of Systems Review of Systems Constitutional: negative for fatigue and weight loss Respiratory: negative for cough and wheezing Cardiovascular: negative for chest  pain, fatigue and palpitations Gastrointestinal: negative for abdominal pain and change in bowel habits Genitourinary:positive for recurrent BV Integument/breast: negative for nipple discharge Musculoskeletal:negative for myalgias Neurological: negative for gait problems and tremors Behavioral/Psych: negative for abusive relationship, depression Endocrine: negative for temperature intolerance      Blood pressure 126/70, pulse 91, weight 157 lb 3.2 oz (71.3 kg), last menstrual period 07/11/2017.  Physical Exam Physical Exam           General:  Alert and no distress Abdomen:  normal findings: no organomegaly, soft, non-tender and no hernia  Pelvis:  External genitalia: normal general appearance Urinary system: urethral meatus normal and bladder without fullness, nontender Vaginal: normal without tenderness, induration or masses Cervix: normal appearance Adnexa: normal bimanual exam Uterus: anteverted and non-tender, normal size    50% of 15 min visit spent on counseling and coordination of care.    Data Reviewed Labs  Assessment and Plan:     1. BV (bacterial vaginosis) Rx: - Cervicovaginal ancillary only - Clindamycin Phosphate, 1 Dose, vaginal cream; Apply 1 Applicatorful topically at bedtime as needed.  Dispense: 5.8 g; Refill: 3  2. Primary dysmenorrhea Rx: - ibuprofen (ADVIL,MOTRIN) 800 MG tablet; Take 1 tablet (800 mg total) by mouth every 8 (eight) hours as needed.  Dispense: 30 tablet; Refill: 5  3. Candida vaginitis Rx: - fluconazole (DIFLUCAN) 150 MG tablet; Take 1 tablet (150 mg total) by mouth once.  Dispense: 1 tablet; Refill: 0  4. STD Screen - HIV - RPR - Hepatitis B - Hepatitis C     Plan    Follow up in 3 months  Orders Placed This Encounter  Procedures  . HIV antibody  . Hepatitis B surface antigen  . RPR  . Hepatitis C antibody   Meds ordered this encounter  Medications  . Clindamycin Phosphate, 1 Dose, vaginal cream    Sig: Apply 1  Applicatorful topically at bedtime as needed.    Dispense:  5.8 g    Refill:  3  . fluconazole (DIFLUCAN) 150 MG tablet    Sig: Take 1 tablet (150 mg total) by mouth once.    Dispense:  1 tablet    Refill:  0  . ibuprofen (ADVIL,MOTRIN) 800 MG tablet    Sig: Take 1 tablet (800 mg total) by mouth every 8 (eight) hours as needed.    Dispense:  30 tablet    Refill:  5

## 2017-08-19 ENCOUNTER — Telehealth: Payer: Self-pay

## 2017-08-19 NOTE — Telephone Encounter (Signed)
Returned call and pt wanted results and had question about rx.

## 2017-08-19 NOTE — Telephone Encounter (Signed)
Returned call, no answer, left vm 

## 2017-10-18 ENCOUNTER — Encounter (HOSPITAL_COMMUNITY): Payer: Self-pay | Admitting: Emergency Medicine

## 2017-10-18 ENCOUNTER — Emergency Department (HOSPITAL_COMMUNITY): Payer: Federal, State, Local not specified - PPO

## 2017-10-18 ENCOUNTER — Emergency Department (HOSPITAL_COMMUNITY)
Admission: EM | Admit: 2017-10-18 | Discharge: 2017-10-18 | Disposition: A | Discharge status: Home / Self Care | Payer: Federal, State, Local not specified - PPO | Attending: Emergency Medicine | Admitting: Emergency Medicine

## 2017-10-18 DIAGNOSIS — Z77118 Contact with and (suspected) exposure to other environmental pollution: Secondary | ICD-10-CM | POA: Insufficient documentation

## 2017-10-18 DIAGNOSIS — N83202 Unspecified ovarian cyst, left side: Secondary | ICD-10-CM

## 2017-10-18 DIAGNOSIS — Z79899 Other long term (current) drug therapy: Secondary | ICD-10-CM | POA: Insufficient documentation

## 2017-10-18 DIAGNOSIS — R109 Unspecified abdominal pain: Secondary | ICD-10-CM | POA: Diagnosis not present

## 2017-10-18 DIAGNOSIS — R102 Pelvic and perineal pain: Secondary | ICD-10-CM | POA: Diagnosis not present

## 2017-10-18 LAB — URINALYSIS, ROUTINE W REFLEX MICROSCOPIC
BILIRUBIN URINE: NEGATIVE
Glucose, UA: NEGATIVE mg/dL
Hgb urine dipstick: NEGATIVE
KETONES UR: NEGATIVE mg/dL
Leukocytes, UA: NEGATIVE
NITRITE: NEGATIVE
Protein, ur: NEGATIVE mg/dL
Specific Gravity, Urine: 1.018 (ref 1.005–1.030)
pH: 7 (ref 5.0–8.0)

## 2017-10-18 LAB — WET PREP, GENITAL
SPERM: NONE SEEN
TRICH WET PREP: NONE SEEN
Yeast Wet Prep HPF POC: NONE SEEN

## 2017-10-18 LAB — CBC
HEMATOCRIT: 28.6 % — AB (ref 36.0–46.0)
HEMOGLOBIN: 8.2 g/dL — AB (ref 12.0–15.0)
MCH: 18.5 pg — AB (ref 26.0–34.0)
MCHC: 28.7 g/dL — AB (ref 30.0–36.0)
MCV: 64.6 fL — AB (ref 78.0–100.0)
Platelets: 289 10*3/uL (ref 150–400)
RBC: 4.43 MIL/uL (ref 3.87–5.11)
RDW: 20.1 % — AB (ref 11.5–15.5)
WBC: 5.8 10*3/uL (ref 4.0–10.5)

## 2017-10-18 LAB — BASIC METABOLIC PANEL
ANION GAP: 5 (ref 5–15)
BUN: 7 mg/dL (ref 6–20)
CO2: 25 mmol/L (ref 22–32)
Calcium: 9 mg/dL (ref 8.9–10.3)
Chloride: 107 mmol/L (ref 101–111)
Creatinine, Ser: 0.75 mg/dL (ref 0.44–1.00)
GFR calc Af Amer: >60 mL/min (ref >60)
GLUCOSE: 95 mg/dL (ref 65–99)
POTASSIUM: 3.8 mmol/L (ref 3.5–5.1)
Sodium: 137 mmol/L (ref 135–145)

## 2017-10-18 LAB — I-STAT BETA HCG BLOOD, ED (MC, WL, AP ONLY): I-stat hCG, quantitative: <5 m[IU]/mL (ref <5)

## 2017-10-18 MED ORDER — KETOROLAC TROMETHAMINE 30 MG/ML IJ SOLN
30.0000 mg | Freq: Once | INTRAMUSCULAR | Status: AC
  Administered 2017-10-18: 30 mg via INTRAMUSCULAR
  Filled 2017-10-18: qty 1

## 2017-10-18 MED ORDER — TRAMADOL HCL 50 MG PO TABS: 50.0000 mg | ORAL_TABLET | Freq: Four times a day (QID) | ORAL | 0 refills | Status: DC | PRN

## 2017-10-18 MED ORDER — IBUPROFEN 800 MG PO TABS: 800.0000 mg | ORAL_TABLET | Freq: Three times a day (TID) | ORAL | 0 refills | Status: DC

## 2017-10-18 NOTE — ED Notes (Signed)
Pt and family understood dc material. NAD noted,. Scripts given at [facility]

## 2017-10-18 NOTE — ED Notes (Signed)
Pelvic cart 

## 2017-10-18 NOTE — ED Triage Notes (Signed)
Pt to ER for evaluation of left flank pain x2 days, described as sharp, states radiates into left abdomen. Pt states nausea without vomiting. Denies diarrhea. Denies urinary symptoms.

## 2017-10-18 NOTE — ED Notes (Signed)
Pt returned from CT at 17:45.

## 2017-10-18 NOTE — ED Notes (Signed)
Provider cleared patient to ear and drink. Patient provided soda and graham crackers

## 2017-10-18 NOTE — ED Provider Notes (Signed)
[facility] EMERGENCY DEPARTMENT Provider Note   CSN: [id number] Arrival date & time: 10/18/17  1403     History   Chief Complaint Chief Complaint  Patient presents with  . Flank Pain    HPI Rylee Huestis is a 21 y.o. female.  HPI  21 y.o. female, presents to the Emergency Department today due to intermittent left sided flank pain with radiation into left groin. Notes symptoms x 2 days. States sudden sharp sensation that passes and follows with dull ache. No N/V. No diaphoresis. No CP/SOB/ABD pain. No hx kidney stones. Denies dysuria or hematuria. No vaginal bleeding or discharge. No fevers. No cough/congestion. Motrin with relief at times. No meds PTA. Denies lifting or trauma to area. No other symptoms noted   Past Medical History:  Diagnosis Date  . Acne   . Environmental allergies   . [id number](478.2)     Patient Active Problem List   Diagnosis Date Noted  . BV (bacterial vaginosis) 08/13/2017  . Insomnia 02/02/2014  . Abdominal pain, unspecified site 12/29/2013  . Anemia 09/14/2013  . BMI (body mass index), pediatric, 5% to less than 85% for age 54/15/2014  . Acne 08/30/2013  . Constipation 08/30/2013  . Irregular menses 08/30/2013  . Blurry vision 08/30/2013  . Tension headache 03/21/2013  . Migraine without aura and without status migrainosus, not intractable 03/21/2013    Past Surgical History:  Procedure Laterality Date  . TONSILLECTOMY    . TONSILLECTOMY    . WISDOM TOOTH EXTRACTION  09/2015    OB History    Gravida Para Term Preterm AB Living   0 0 0 0 0 0   SAB TAB Ectopic Multiple Live Births   0 0 0 0         Home Medications    Prior to Admission medications   Medication Sig Start Date End Date Taking? Authorizing Provider  albuterol (PROVENTIL HFA;VENTOLIN HFA) 108 (90 Base) MCG/ACT inhaler Inhale 2 puffs into the lungs every 4 (four) hours as needed for wheezing or shortness of breath. Patient not taking: Reported on  08/12/2017 01/14/17   Hollice Gong, MD  Clindamycin Phosphate, 1 Dose, vaginal cream Apply 1 Applicatorful topically at bedtime as needed. 08/12/17   Brock Bad, MD  fexofenadine-pseudoephedrine (ALLEGRA-D 24) 180-240 MG 24 hr tablet Take 1 tablet by mouth daily.    [provider]  ibuprofen (ADVIL,MOTRIN) 800 MG tablet Take 1 tablet (800 mg total) by mouth every 8 (eight) hours as needed. 08/12/17   Brock Bad, MD  Prenatal MV-Min-Fe Cbn-FA-DHA (PRENATAL PLUS DHA) 7-0.4-100 MG TABS Take 1 tablet by mouth daily before breakfast. 04/20/17   Brock Bad, MD    Family History Family History  Problem Relation Age of Onset  . Depression Mother     Social History Social History   Tobacco Use  . Smoking status: Never Smoker  . Smokeless tobacco: Never Used  Substance Use Topics  . Alcohol use: No  . Drug use: No     Allergies   Penicillins; Amoxicillin-pot clavulanate; and Metronidazole   Review of Systems Review of Systems ROS reviewed and all are negative for acute change except as noted in the HPI.  Physical Exam Updated Vital Signs BP 116/64   Pulse 74   Temp 98 F (36.7 C) (Oral)   Resp 16   Ht 5' 2.5" (1.588 m)   Wt 72.6 kg (160 lb)   SpO2 100%   BMI 28.80 kg/m  Physical Exam  Constitutional: She is oriented to person, place, and time. She appears well-developed and well-nourished. No distress.  HENT:  Head: Normocephalic and atraumatic.  Right Ear: Tympanic membrane, external ear and ear canal normal.  Left Ear: Tympanic membrane, external ear and ear canal normal.  Nose: Nose normal.  Mouth/Throat: Uvula is midline, oropharynx is clear and moist and mucous membranes are normal. No trismus in the jaw. No oropharyngeal exudate, posterior oropharyngeal erythema or tonsillar abscesses.  Eyes: EOM are normal. Pupils are equal, round, and reactive to light.  Neck: Normal range of motion. Neck supple. No tracheal deviation present.    Cardiovascular: Normal rate, regular rhythm, S1 normal, S2 normal, normal heart sounds, intact distal pulses and normal pulses.  Pulmonary/Chest: Effort normal and breath sounds normal. No respiratory distress. She has no decreased breath sounds. She has no wheezes. She has no rhonchi. She has no rales.  Abdominal: Normal appearance and bowel sounds are normal. There is no tenderness. There is CVA tenderness (left). There is no rigidity, no rebound, no guarding, no tenderness at McBurney's point and negative Murphy's sign.  Musculoskeletal: Normal range of motion.  Neurological: She is alert and oriented to person, place, and time.  Skin: Skin is warm and dry.  Psychiatric: She has a normal mood and affect. Her speech is normal and behavior is normal. Thought content normal.   ED Treatments / Results  Labs (all labs ordered are listed, but only abnormal results are displayed) Labs Reviewed  WET PREP, GENITAL - Abnormal; Notable for the following components:      Result Value   Clue Cells Wet Prep HPF POC PRESENT (*)    WBC, Wet Prep HPF POC PRESENT (*)    All other components within normal limits  CBC - Abnormal; Notable for the following components:   Hemoglobin 8.2 (*)    HCT 28.6 (*)    MCV 64.6 (*)    MCH 18.5 (*)    MCHC 28.7 (*)    RDW 20.1 (*)    All other components within normal limits  URINALYSIS, ROUTINE W REFLEX MICROSCOPIC  BASIC METABOLIC PANEL  I-STAT BETA HCG BLOOD, ED ([facility], [facility], [facility] ONLY)  GC/CHLAMYDIA PROBE AMP ([facility]) NOT AT [facility]    EKG  EKG Interpretation None       Radiology [place] Transvaginal Non-ob  Result Date: 10/18/2017 CLINICAL DATA:  Pelvic pain. EXAM: TRANSABDOMINAL AND TRANSVAGINAL ULTRASOUND OF PELVIS DOPPLER ULTRASOUND OF OVARIES TECHNIQUE: Both transabdominal and transvaginal ultrasound examinations of the pelvis were performed. Transabdominal technique was performed for global imaging of the pelvis including uterus, ovaries, adnexal  regions, and pelvic cul-de-sac. It was necessary to proceed with endovaginal exam following the transabdominal exam to visualize the endometrium and ovaries. Color and duplex Doppler ultrasound was utilized to evaluate blood flow to the ovaries. COMPARISON:  None. FINDINGS: Uterus Measurements: 6.1 x 4.1 x 5.5 cm. 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 No mass. Endometrium Thickness: 9.7 mm on the right and 10 mm on the left. No focal abnormality visualized. Right ovary Measurements: 2.7 x 1.5 x 1.9 cm. Normal appearance/no adnexal mass. Left ovary Measurements: 5.5 x 3.5 x 5.4 cm. The ovary contains a 4.3 x 2.9 x 4.4 cm central complex region which is hypoechoic and heterogeneous in echotexture. There is blood flow in the peripheral ovarian tissue but no blood flow within the central complex hypoechoic region. Pulsed Doppler evaluation of both ovaries demonstrates normal low-resistance arterial and venous waveforms. Other findings Moderate  fluid in the pelvis is relatively simple in appearance. IMPRESSION: 1. There is a 4.3 x 2.9 x 4.4 cm hypoechoic and heterogeneous rounded, complex masslike region in the left ovary. There is blood flow in the peripheral ovarian tissue but none within the central complex masslike region. Differential considerations would include a tubo-ovarian abscess in the appropriate clinical setting, a hemorrhagic cyst, a neoplasm, or an endometrioma. Recommend clinical correlation. If the patient does not have symptoms suggesting abscess, recommend follow-up ultrasound in 6-12 weeks to ensure resolution. 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 Electronically Signed   By: Gerome Samavid  Williams III M.D   On: 10/18/2017 20:46   [place] Pelvis Complete  Result Date: 10/18/2017 CLINICAL DATA:  Pelvic pain. EXAM: TRANSABDOMINAL AND TRANSVAGINAL ULTRASOUND OF PELVIS DOPPLER ULTRASOUND OF OVARIES TECHNIQUE: Both transabdominal and transvaginal ultrasound examinations of the pelvis were performed.  Transabdominal technique was performed for global imaging of the pelvis including uterus, ovaries, adnexal regions, and pelvic cul-de-sac. It was necessary to proceed with endovaginal exam following the transabdominal exam to visualize the endometrium and ovaries. Color and duplex Doppler ultrasound was utilized to evaluate blood flow to the ovaries. COMPARISON:  None. FINDINGS: Uterus Measurements: 6.1 x 4.1 x 5.5 cm. 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 No mass. Endometrium Thickness: 9.7 mm on the right and 10 mm on the left. No focal abnormality visualized. Right ovary Measurements: 2.7 x 1.5 x 1.9 cm. Normal appearance/no adnexal mass. Left ovary Measurements: 5.5 x 3.5 x 5.4 cm. The ovary contains a 4.3 x 2.9 x 4.4 cm central complex region which is hypoechoic and heterogeneous in echotexture. There is blood flow in the peripheral ovarian tissue but no blood flow within the central complex hypoechoic region. Pulsed Doppler evaluation of both ovaries demonstrates normal low-resistance arterial and venous waveforms. Other findings Moderate fluid in the pelvis is relatively simple in appearance. IMPRESSION: 1. There is a 4.3 x 2.9 x 4.4 cm hypoechoic and heterogeneous rounded, complex masslike region in the left ovary. There is blood flow in the peripheral ovarian tissue but none within the central complex masslike region. Differential considerations would include a tubo-ovarian abscess in the appropriate clinical setting, a hemorrhagic cyst, a neoplasm, or an endometrioma. Recommend clinical correlation. If the patient does not have symptoms suggesting abscess, recommend follow-up ultrasound in 6-12 weeks to ensure resolution. 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 Electronically Signed   By: Gerome Samavid  Williams III M.D   On: 10/18/2017 20:46   [place] Art/ven Flow Abd Pelv Doppler  Result Date: 10/18/2017 CLINICAL DATA:  Pelvic pain. EXAM: TRANSABDOMINAL AND TRANSVAGINAL ULTRASOUND OF PELVIS DOPPLER  ULTRASOUND OF OVARIES TECHNIQUE: Both transabdominal and transvaginal ultrasound examinations of the pelvis were performed. Transabdominal technique was performed for global imaging of the pelvis including uterus, ovaries, adnexal regions, and pelvic cul-de-sac. It was necessary to proceed with endovaginal exam following the transabdominal exam to visualize the endometrium and ovaries. Color and duplex Doppler ultrasound was utilized to evaluate blood flow to the ovaries. COMPARISON:  None. FINDINGS: Uterus Measurements: 6.1 x 4.1 x 5.5 cm. 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 No mass. Endometrium Thickness: 9.7 mm on the right and 10 mm on the left. No focal abnormality visualized. Right ovary Measurements: 2.7 x 1.5 x 1.9 cm. Normal appearance/no adnexal mass. Left ovary Measurements: 5.5 x 3.5 x 5.4 cm. The ovary contains a 4.3 x 2.9 x 4.4 cm central complex region which is hypoechoic and heterogeneous in echotexture. There is blood flow in the peripheral ovarian tissue but no blood  flow within the central complex hypoechoic region. Pulsed Doppler evaluation of both ovaries demonstrates normal low-resistance arterial and venous waveforms. Other findings Moderate fluid in the pelvis is relatively simple in appearance. IMPRESSION: 1. There is a 4.3 x 2.9 x 4.4 cm hypoechoic and heterogeneous rounded, complex masslike region in the left ovary. There is blood flow in the peripheral ovarian tissue but none within the central complex masslike region. Differential considerations would include a tubo-ovarian abscess in the appropriate clinical setting, a hemorrhagic cyst, a neoplasm, or an endometrioma. Recommend clinical correlation. If the patient does not have symptoms suggesting abscess, recommend follow-up ultrasound in 6-12 weeks to ensure resolution. 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 Electronically Signed   By: Gerome Sam III M.D   On: 10/18/2017 20:46   Ct Renal Stone Study  Result Date:  10/18/2017 CLINICAL DATA:  21 year old female with left flank and abdominal pain for 2 days. Nausea. EXAM: CT ABDOMEN AND PELVIS WITHOUT CONTRAST TECHNIQUE: Multidetector CT imaging of the abdomen and pelvis was performed following the standard protocol without IV contrast. COMPARISON:  None. FINDINGS: Please note that parenchymal abnormalities may be missed without intravenous contrast. Lower chest: No acute abnormality Hepatobiliary: The liver is unremarkable. What appears to be the gallbladder is unremarkable. No biliary dilatation. Pancreas: Unremarkable Spleen: Unremarkable Adrenals/Urinary Tract: The kidneys, adrenal glands and bladder are unremarkable. No urinary calculi are identified. Stomach/Bowel: Stomach is within normal limits. Appendix appears normal. No evidence of bowel wall thickening, distention, or inflammatory changes. Vascular/Lymphatic: No significant vascular findings are present. No enlarged abdominal or pelvic lymph nodes. Reproductive: A 2.5 x 8 cm collection or cystic structure in the posterior pelvis is noted. The uterus is grossly unremarkable. Other: No ascites or pneumoperitoneum. Musculoskeletal: Unremarkable IMPRESSION: 2.5 x 8 cm collection versus cystic structure within the posterior pelvis which may be adnexal. Pelvic ultrasound recommended for further evaluation. No other significant abnormalities. Electronically Signed   By: Harmon Pier M.D.   On: 10/18/2017 18:04    Procedures Procedures (including critical care time)  Medications Ordered in ED Medications  ketorolac (TORADOL) 30 MG/ML injection 30 mg (30 mg Intramuscular Given 10/18/17 1746)     Initial Impression / Assessment and Plan / ED Course  I have reviewed the triage vital signs and the nursing notes.  Pertinent labs & imaging results that were available during my care of the patient were reviewed by me and considered in my medical decision making (see chart for details).  Final Clinical Impressions(s)  / ED Diagnoses  {I have reviewed and evaluated the relevant laboratory values. {I have reviewed and evaluated the relevant imaging studies.  {I have reviewed the relevant previous healthcare records.  {I obtained HPI from historian.   ED Course:  Assessment: Pt is a 21 y.o. female presents to the Emergency Department today due to intermittent left sided flank pain with radiation into left groin. Notes symptoms x 2 days. States sudden sharp sensation that passes and follows with dull ache. No N/V. No diaphoresis. No CP/SOB/ABD pain. No hx kidney stones. Denies dysuria or hematuria. No vaginal bleeding or discharge. No fevers. No cough/congestion. Motrin with relief at times. No meds PTA. Denies lifting or trauma to area. On exam, pt in NAD. Nontoxic/nonseptic appearing. VSS. Afebrile. Lungs CTA. Heart RRR. Abdomen nontender soft. Mild CVA tenderness on left. Labs unremarkable. UA negative. CT renal with left adnexal fluid collection. Recommended pelvic US which showed 4x3x4.5cm hypoechoic rounded complex masslike region in left ovary. TOA vs. Hemorrhagic cyst  vs. Neoplasm. No WBC. No fever. Mild TTP left adnexa. Consult to OBGYN (Dr. Lynetta Mare) wil lsee inoffice. Likely hemorrhagic cyst. GU exam with mild discharge. No CMT. No adnexal tenderness. GC obtained. Wet prep with BV and WBCs. Given analgesia in ED. Plan is to DC home with follow up to PCP. At time of discharge, Patient is in no acute distress. Vital Signs are stable. Patient is able to ambulate. Patient able to tolerate PO.   Disposition/Plan:  DC home Additional Verbal discharge instructions given and discussed with patient.  Pt Instructed to f/u with PCP in the next week for evaluation and treatment of symptoms. Return precautions given Pt acknowledges and agrees with plan  Supervising Physician Tegeler, Canary Brim, *  Final diagnoses:  Hemorrhagic cyst of left ovary    ED Discharge Orders    None       Audry Pili, PA-C 10/18/17  2136    Tegeler, Canary Brim, MD 10/18/17 2330

## 2017-10-18 NOTE — Discharge Instructions (Signed)
Please read and follow all provided instructions.  Your diagnoses today include:  1. Hemorrhagic cyst of left ovary    Medications prescribed:  Take as prescribed   Home care instructions:  Follow any educational materials contained in this packet.  Follow-up instructions: Please follow-up with OBGYN for further evaluation of symptoms and treatment   Return instructions:  Please return to the Emergency Department if you do not get better, if you get worse, or new symptoms OR  - Fever (temperature greater than 101.35F)  - Bleeding that does not stop with holding pressure to the area    -Severe pain (please note that you may be more sore the day after your accident)  - Chest Pain  - Difficulty breathing  - Severe nausea or vomiting  - Inability to tolerate food and liquids  - Passing out  - Skin becoming red around your wounds  - Change in mental status (confusion or lethargy)  - New numbness or weakness    Please return if you have any other emergent concerns.  Additional Information:  Your vital signs today were: BP 124/65    Pulse 60    Temp 98 F (36.7 C) (Oral)    Resp 16    Ht 5' 2.5" (1.588 m)    Wt 72.6 kg (160 lb)    SpO2 100%    BMI 28.80 kg/m  If your blood pressure (BP) was elevated above 135/85 this visit, please have this repeated by your doctor within one month. ---------------

## 2017-10-18 NOTE — ED Notes (Signed)
Pt in CT as of 17:30.

## 2017-10-19 LAB — GC/CHLAMYDIA PROBE AMP (~~LOC~~) NOT AT ARMC
CHLAMYDIA, DNA PROBE: NEGATIVE
Neisseria Gonorrhea: NEGATIVE

## 2017-11-02 ENCOUNTER — Encounter: Payer: Self-pay | Admitting: Obstetrics & Gynecology

## 2017-11-02 ENCOUNTER — Ambulatory Visit (INDEPENDENT_AMBULATORY_CARE_PROVIDER_SITE_OTHER): Payer: Federal, State, Local not specified - PPO | Admitting: Obstetrics & Gynecology

## 2017-11-02 VITALS — BP 135/76 | HR 77 | Wt 161.0 lb

## 2017-11-02 DIAGNOSIS — N83202 Unspecified ovarian cyst, left side: Secondary | ICD-10-CM | POA: Diagnosis not present

## 2017-11-02 NOTE — Progress Notes (Signed)
History:  21 y.o. G0P0000 here today for f/u of left ov cyst. LMP 10/26/2017. Pt reports monthly cycles. Pt is not sure the cycle length. Her menses last 3-4 days.  Pt reports that for about 2 years she has had this pain but, she just got an US where the cyst was noted. The pain is intermittent and can be peridiocally sharp or dull.  Pt got NSAID in the ED. It helps with the pain if she takes the Motrin and the Tramadol. Pt had Nexplanon until 07/2016.  She had no menses while on Nexplanon initially.Pt started having menses again prior to removal of the Nexplanon and that's when the apin began.  Prior to the Nexplanon she reports no pain with menses.   Pt denies pain with intercourse.   Pt reprots a h/o GC 3 years prev. Her cervical cx were neg 10/23/2017.  The following portions of the patient's history were reviewed and updated as appropriate: allergies, current medications, past family history, past medical history, past social history, past surgical history and problem list.  Review of Systems:  Pertinent items are noted in HPI.   Objective:  Physical Exam Blood pressure 135/76, pulse 77, weight 161 lb (73 kg), last menstrual period 10/26/2017. CONSTITUTIONAL: Well-developed, well-nourished female in no acute distress.  HENT:  Normocephalic, atraumatic EYES: Conjunctivae and EOM are normal. No scleral icterus.  NECK: Normal range of motion SKIN: Skin is warm and dry. No rash noted. Not diaphoretic.No pallor. NEUROLGIC: Alert and oriented to person, place, and time. Normal coordination.  Abd: Soft, nontender and nondistended Pelvic: Normal appearing external genitalia; normal appearing vaginal mucosa and cervix.  Normal discharge.  Small uterus, left adnexa slightly enlarged nontender. No uterine tenderness.  Labs and Imaging [place] Transvaginal Non-ob  Result Date: 10/18/2017 CLINICAL DATA:  Pelvic pain. EXAM: TRANSABDOMINAL AND TRANSVAGINAL ULTRASOUND OF PELVIS DOPPLER ULTRASOUND OF OVARIES  TECHNIQUE: Both transabdominal and transvaginal ultrasound examinations of the pelvis were performed. Transabdominal technique was performed for global imaging of the pelvis including uterus, ovaries, adnexal regions, and pelvic cul-de-sac. It was necessary to proceed with endovaginal exam following the transabdominal exam to visualize the endometrium and ovaries. Color and duplex Doppler ultrasound was utilized to evaluate blood flow to the ovaries. COMPARISON:  None. FINDINGS: Uterus Measurements: 6.1 x 4.1 x 5.5 cm. 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 No mass. Endometrium Thickness: 9.7 mm on the right and 10 mm on the left. No focal abnormality visualized. Right ovary Measurements: 2.7 x 1.5 x 1.9 cm. Normal appearance/no adnexal mass. Left ovary Measurements: 5.5 x 3.5 x 5.4 cm. The ovary contains a 4.3 x 2.9 x 4.4 cm central complex region which is hypoechoic and heterogeneous in echotexture. There is blood flow in the peripheral ovarian tissue but no blood flow within the central complex hypoechoic region. Pulsed Doppler evaluation of both ovaries demonstrates normal low-resistance arterial and venous waveforms. Other findings Moderate fluid in the pelvis is relatively simple in appearance. IMPRESSION: 1. There is a 4.3 x 2.9 x 4.4 cm hypoechoic and heterogeneous rounded, complex masslike region in the left ovary. There is blood flow in the peripheral ovarian tissue but none within the central complex masslike region. Differential considerations would include a tubo-ovarian abscess in the appropriate clinical setting, a hemorrhagic cyst, a neoplasm, or an endometrioma. Recommend clinical correlation. If the patient does not have symptoms suggesting abscess, recommend follow-up ultrasound in 6-12 weeks to ensure resolution. 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 Electronically Signed   By: Gerome Samavid  Williams  III M.D   On: 10/18/2017 20:46   US Pelvis Complete  Result Date: 10/18/2017 CLINICAL DATA:   Pelvic pain. EXAM: TRANSABDOMINAL AND TRANSVAGINAL ULTRASOUND OF PELVIS DOPPLER ULTRASOUND OF OVARIES TECHNIQUE: Both transabdominal and transvaginal ultrasound examinations of the pelvis were performed. Transabdominal technique was performed for global imaging of the pelvis including uterus, ovaries, adnexal regions, and pelvic cul-de-sac. It was necessary to proceed with endovaginal exam following the transabdominal exam to visualize the endometrium and ovaries. Color and duplex Doppler ultrasound was utilized to evaluate blood flow to the ovaries. COMPARISON:  None. FINDINGS: Uterus Measurements: 6.1 x 4.1 x 5.5 cm. 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 No mass. Endometrium Thickness: 9.7 mm on the right and 10 mm on the left. No focal abnormality visualized. Right ovary Measurements: 2.7 x 1.5 x 1.9 cm. Normal appearance/no adnexal mass. Left ovary Measurements: 5.5 x 3.5 x 5.4 cm. The ovary contains a 4.3 x 2.9 x 4.4 cm central complex region which is hypoechoic and heterogeneous in echotexture. There is blood flow in the peripheral ovarian tissue but no blood flow within the central complex hypoechoic region. Pulsed Doppler evaluation of both ovaries demonstrates normal low-resistance arterial and venous waveforms. Other findings Moderate fluid in the pelvis is relatively simple in appearance. IMPRESSION: 1. There is a 4.3 x 2.9 x 4.4 cm hypoechoic and heterogeneous rounded, complex masslike region in the left ovary. There is blood flow in the peripheral ovarian tissue but none within the central complex masslike region. Differential considerations would include a tubo-ovarian abscess in the appropriate clinical setting, a hemorrhagic cyst, a neoplasm, or an endometrioma. Recommend clinical correlation. If the patient does not have symptoms suggesting abscess, recommend follow-up ultrasound in 6-12 weeks to ensure resolution. 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 Electronically Signed   By: Gerome Sam III M.D   On: 10/18/2017 20:46   [place] Art/ven Flow Abd Pelv Doppler  Result Date: 10/18/2017 CLINICAL DATA:  Pelvic pain. EXAM: TRANSABDOMINAL AND TRANSVAGINAL ULTRASOUND OF PELVIS DOPPLER ULTRASOUND OF OVARIES TECHNIQUE: Both transabdominal and transvaginal ultrasound examinations of the pelvis were performed. Transabdominal technique was performed for global imaging of the pelvis including uterus, ovaries, adnexal regions, and pelvic cul-de-sac. It was necessary to proceed with endovaginal exam following the transabdominal exam to visualize the endometrium and ovaries. Color and duplex Doppler ultrasound was utilized to evaluate blood flow to the ovaries. COMPARISON:  None. FINDINGS: Uterus Measurements: 6.1 x 4.1 x 5.5 cm. 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 No mass. Endometrium Thickness: 9.7 mm on the right and 10 mm on the left. No focal abnormality visualized. Right ovary Measurements: 2.7 x 1.5 x 1.9 cm. Normal appearance/no adnexal mass. Left ovary Measurements: 5.5 x 3.5 x 5.4 cm. The ovary contains a 4.3 x 2.9 x 4.4 cm central complex region which is hypoechoic and heterogeneous in echotexture. There is blood flow in the peripheral ovarian tissue but no blood flow within the central complex hypoechoic region. Pulsed Doppler evaluation of both ovaries demonstrates normal low-resistance arterial and venous waveforms. Other findings Moderate fluid in the pelvis is relatively simple in appearance. IMPRESSION: 1. There is a 4.3 x 2.9 x 4.4 cm hypoechoic and heterogeneous rounded, complex masslike region in the left ovary. There is blood flow in the peripheral ovarian tissue but none within the central complex masslike region. Differential considerations would include a tubo-ovarian abscess in the appropriate clinical setting, a hemorrhagic cyst, a neoplasm, or an endometrioma. Recommend clinical correlation. If the patient does not have symptoms suggesting  abscess, recommend follow-up  ultrasound in 6-12 weeks to ensure resolution. 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 Electronically Signed   By: Gerome Samavid  Williams III M.D   On: 10/18/2017 20:46   Ct Renal Stone Study  Result Date: 10/18/2017 CLINICAL DATA:  21 year old female with left flank and abdominal pain for 2 days. Nausea. EXAM: CT ABDOMEN AND PELVIS WITHOUT CONTRAST TECHNIQUE: Multidetector CT imaging of the abdomen and pelvis was performed following the standard protocol without IV contrast. COMPARISON:  None. FINDINGS: Please note that parenchymal abnormalities may be missed without intravenous contrast. Lower chest: No acute abnormality Hepatobiliary: The liver is unremarkable. What appears to be the gallbladder is unremarkable. No biliary dilatation. Pancreas: Unremarkable Spleen: Unremarkable Adrenals/Urinary Tract: The kidneys, adrenal glands and bladder are unremarkable. No urinary calculi are identified. Stomach/Bowel: Stomach is within normal limits. Appendix appears normal. No evidence of bowel wall thickening, distention, or inflammatory changes. Vascular/Lymphatic: No significant vascular findings are present. No enlarged abdominal or pelvic lymph nodes. Reproductive: A 2.5 x 8 cm collection or cystic structure in the posterior pelvis is noted. The uterus is grossly unremarkable. Other: No ascites or pneumoperitoneum. Musculoskeletal: Unremarkable IMPRESSION: 2.5 x 8 cm collection versus cystic structure within the posterior pelvis which may be adnexal. Pelvic ultrasound recommended for further evaluation. No other significant abnormalities. Electronically Signed   By: Harmon PierJeffrey  Hu M.D.   On: 10/18/2017 18:04    Assessment & Plan:  Left ov cyst.   Unsure if this is a functional cyst or possibly an endometrioma.   Rec repeat US in 3 months with OV following US  Keep NSAIDS with Ultram for severe pain Pt is trying to conceive.  All questions answered  Total face-to-face time with patient was 20 min.  Greater than 50%  was spent in counseling and coordination of care with the patient.     Nazli Penn L. Harraway-Smith, M.D., Evern CoreFACOG

## 2017-11-02 NOTE — Patient Instructions (Signed)
Ovarian Cyst An ovarian cyst is a fluid-filled sac that forms on an ovary. The ovaries are small organs that produce eggs in women. Various types of cysts can form on the ovaries. Some may cause symptoms and require treatment. Most ovarian cysts go away on their own, are not cancerous (are benign), and do not cause problems. Common types of ovarian cysts include:  Functional (follicle) cysts. ? Occur during the menstrual cycle, and usually go away with the next menstrual cycle if you do not get pregnant. ? Usually cause no symptoms.  Endometriomas. ? Are cysts that form from the tissue that lines the uterus (endometrium). ? Are sometimes called "chocolate cysts" because they become filled with blood that turns brown. ? Can cause pain in the lower abdomen during intercourse and during your period.  Cystadenoma cysts. ? Develop from cells on the outside surface of the ovary. ? Can get very large and cause lower abdomen pain and pain with intercourse. ? Can cause severe pain if they twist or break open (rupture).  Dermoid cysts. ? Are sometimes found in both ovaries. ? May contain different kinds of body tissue, such as skin, teeth, hair, or cartilage. ? Usually do not cause symptoms unless they get very big.  Theca lutein cysts. ? Occur when too much of a certain hormone (human chorionic gonadotropin) is produced and overstimulates the ovaries to produce an egg. ? Are most common after having procedures used to assist with the conception of a baby (in vitro fertilization).  What are the causes? Ovarian cysts may be caused by:  Ovarian hyperstimulation syndrome. This is a condition that can develop from taking fertility medicines. It causes multiple large ovarian cysts to form.  Polycystic ovarian syndrome (PCOS). This is a common hormonal disorder that can cause ovarian cysts, as well as problems with your period or fertility.  What increases the risk? The following factors may make  you more likely to develop ovarian cysts:  Being overweight or obese.  Taking fertility medicines.  Taking certain forms of hormonal birth control.  Smoking.  What are the signs or symptoms? Many ovarian cysts do not cause symptoms. If symptoms are present, they may include:  Pelvic pain or pressure.  Pain in the lower abdomen.  Pain during sex.  Abdominal swelling.  Abnormal menstrual periods.  Increasing pain with menstrual periods.  How is this diagnosed? These cysts are commonly found during a routine pelvic exam. You may have tests to find out more about the cyst, such as:  Ultrasound.  X-ray of the pelvis.  CT scan.  MRI.  Blood tests.  How is this treated? Many ovarian cysts go away on their own without treatment. Your health care provider may want to check your cyst regularly for 2-3 months to see if it changes. If you are in menopause, it is especially important to have your cyst monitored closely because menopausal women have a higher rate of ovarian cancer. When treatment is needed, it may include:  Medicines to help relieve pain.  A procedure to drain the cyst (aspiration).  Surgery to remove the whole cyst.  Hormone treatment or birth control pills. These methods are sometimes used to help dissolve a cyst.  Follow these instructions at home:  Take over-the-counter and prescription medicines only as told by your health care provider.  Do not drive or use heavy machinery while taking prescription pain medicine.  Get regular pelvic exams and Pap tests as often as told by your health care   provider.  Return to your normal activities as told by your health care provider. Ask your health care provider what activities are safe for you.  Do not use any products that contain nicotine or tobacco, such as cigarettes and e-cigarettes. If you need help quitting, ask your health care provider.  Keep all follow-up visits as told by your health care provider.  This is important. Contact a health care provider if:  Your periods are late, irregular, or painful, or they stop.  You have pelvic pain that does not go away.  You have pressure on your bladder or trouble emptying your bladder completely.  You have pain during sex.  You have any of the following in your abdomen: ? A feeling of fullness. ? Pressure. ? Discomfort. ? Pain that does not go away. ? Swelling.  You feel generally ill.  You become constipated.  You lose your appetite.  You develop severe acne.  You start to have more body hair and facial hair.  You are gaining weight or losing weight without changing your exercise and eating habits.  You think you may be pregnant. Get help right away if:  You have abdominal pain that is severe or gets worse.  You cannot eat or drink without vomiting.  You suddenly develop a fever.  Your menstrual period is much heavier than usual. This information is not intended to replace advice given to you by your health care provider. Make sure you discuss any questions you have with your health care provider. Document Released: 11/03/2005 Document Revised: 05/23/2016 Document Reviewed: 04/06/2016 Elsevier Interactive Patient Education  2018 [employer]  

## 2017-11-14 ENCOUNTER — Other Ambulatory Visit: Payer: Self-pay

## 2017-11-14 ENCOUNTER — Ambulatory Visit (HOSPITAL_COMMUNITY)
Admission: EM | Admit: 2017-11-14 | Discharge: 2017-11-14 | Disposition: A | Discharge status: Home / Self Care | Payer: Federal, State, Local not specified - PPO | Attending: Family Medicine | Admitting: Family Medicine

## 2017-11-14 ENCOUNTER — Encounter (HOSPITAL_COMMUNITY): Payer: Self-pay | Admitting: Emergency Medicine

## 2017-11-14 DIAGNOSIS — K146 Glossodynia: Secondary | ICD-10-CM

## 2017-11-14 MED ORDER — PREDNISONE 10 MG (21) PO TBPK: ORAL_TABLET | Freq: Every day | ORAL | 0 refills | Status: DC

## 2017-11-14 NOTE — ED Triage Notes (Signed)
Pt c/o sores on the back of her tongue x2 weeks. Hurts to eat.

## 2017-11-16 NOTE — ED Provider Notes (Signed)
[facility]   [id number] 11/14/17 Arrival Time: 1525  ASSESSMENT & PLAN:  1. Tongue burning sensation   - glossitis  Meds ordered this encounter  Medications  . predniSONE (STERAPRED UNI-PAK 21 TAB) 10 MG (21) TBPK tablet    Sig: Take by mouth daily. Take as directed.    Dispense:  21 tablet    Refill:  0   Trial of prednisone. Observation. Will f/u if not seeing improvement over the next few days.  Reviewed expectations re: course of current medical issues. Questions answered. Outlined signs and symptoms indicating need for more acute intervention. Patient verbalized understanding. After Visit Summary given.   SUBJECTIVE: History from: patient. Judy Rodgers is a 21 y.o. female who presents with complaint of "my tongue feeling sore, kind of a burning feeling" present for approx 2 weeks. Gradual onset. Questions first noticing after eating spicy chicken wings. Still mildly painful when eating. Described symptoms have stabilized since beginning. No specific aggravating or alleviating factors reported.. No OTC treatment. No h/o similar. No new medications.  ROS: As per HPI.   OBJECTIVE:  Vitals:   11/14/17 1643  BP: 129/66  Pulse: 77  Resp: 16  Temp: 98.8 F (37.1 C)  SpO2: 100%    General appearance: alert; no distress Eyes: PERRLA; EOMI; conjunctiva normal HENT: normocephalic; atraumatic; TMs normal; nasal mucosa normal; oral mucosa normal; tongue mildly inflamed towards posterior region, otherwise unremarkable Neck: supple  Extremities: no cyanosis or edema; symmetrical with no gross deformities Skin: warm and dry Psychological: alert and cooperative; normal mood and affect   Allergies  Allergen Reactions  . Penicillins Nausea And Vomiting    Has patient had a PCN reaction causing immediate rash, facial/tongue/throat swelling, SOB or lightheadedness with hypotension: No Has patient had a PCN reaction causing severe rash involving mucus  membranes or skin necrosis: No Has patient had a PCN reaction that required hospitalization: No Has patient had a PCN reaction occurring within the last 10 years: No If all of the above answers are "NO", then may proceed with Cephalosporin use.   Marland Kitchen. Amoxicillin-Pot Clavulanate Nausea And Vomiting and Other (See Comments)    vertigo  . Metronidazole Nausea And Vomiting and Rash    Nausea - tablets, rash - gel    Past Medical History:  Diagnosis Date  . Acne   . Environmental allergies   . Headache(784.0)    Social History   Socioeconomic History  . Marital status: Single    Spouse name: Not on file  . Number of children: Not on file  . Years of education: Not on file  . Highest education level: Not on file  Social Needs  . Financial resource strain: Not on file  . Food insecurity - worry: Not on file  . Food insecurity - inability: Not on file  . Transportation needs - medical: Not on file  . Transportation needs - non-medical: Not on file  Occupational History  . Not on file  Tobacco Use  . Smoking status: Never Smoker  . Smokeless tobacco: Never Used  Substance and Sexual Activity  . Alcohol use: No  . Drug use: No  . Sexual activity: Yes    Partners: Male    Birth control/protection: None  Other Topics Concern  . Not on file  Social History Narrative  . Not on file   Family History  Problem Relation Age of Onset  . Depression Mother    Past Surgical History:  Procedure Laterality Date  .  TONSILLECTOMY    . TONSILLECTOMY    . WISDOM TOOTH EXTRACTION  09/2015     Mardella LaymanHagler, [name], MD 11/17/17 [phone]

## 2017-11-17 NOTE — L&D Delivery Note (Signed)
[facility]N [facility] Delivery Note  Judy DraftsLaquanda Rodgers is a 22 y.o. G1P0000 s/p VAVD at 5578w2d. She was admitted for IOL for fetal growth restriction (10%).   ROM: 7h 47102m with clear fluid GBS Status: positive (vancomycin) Maximum Maternal Temperature: Temp (24hrs), Avg:98.8 F (37.1 C), Min:98 F (36.7 C), Max:99.9 F (37.7 C)  Labor Progress: . Induction with cytotec, FB and pitocin . AROM 0016 clear fluid . Deep, recurrent variables after AROM - IUPC with amnioinfusion improved3 . Fetal tachycardia and elevated maternal temperature - added gentamicin to GBS antibiotics for possible chorioamnionitis . Frequent position changes, oxygen and boluses for fetal tachycardia and intermittent minimal variability  Delivery Date/Time: 07/17/18 at 0731 Delivery: Pushed in room with patient for about 2 hours. Mother complaining of neck pain, feeling tired. Patient consented for vacuum-assisted delivery - risks and benefits discussed and mother in agreement with plan. Kiwi vacuum applied, delivered with no pop-offs. Head delivered direct OP initially then rotated to ROA. No nuchal cord present. Shoulder and body delivered in usual fashion. Infant with spontaneous cry, placed on mother's abdomen, dried and stimulated. Cord clamped x 2 after 1-minute delay, and cut by family member. Cord blood drawn. Placenta delivered spontaneously with gentle cord traction. Fundus firm with massage and Pitocin. Labia, perineum, vagina, and cervix inspected inspected with no lacerations.   Placenta: spontaneous, intact, 3-vessel cord Complications: Category II strip intermittently during active phase and with pushing Lacerations: none EBL: 200cc  Postpartum Planning [x]  message to sent to schedule follow-up  [x]  vaccines UTD  Infant: female vigorous (NICU present but not needed)  APGARs 8, 9  weight pending   Judy Haws S. Earlene PlaterWallace, DO OB/GYN Fellow, Faculty Practice

## 2017-11-19 DIAGNOSIS — K080 Exfoliation of teeth due to systemic causes: Secondary | ICD-10-CM | POA: Diagnosis not present

## 2017-11-29 ENCOUNTER — Inpatient Hospital Stay (HOSPITAL_COMMUNITY)
Admission: AD | Admit: 2017-11-29 | Discharge: 2017-11-29 | Disposition: A | Discharge status: Home / Self Care | Payer: Federal, State, Local not specified - PPO | Source: Ambulatory Visit | Attending: Obstetrics & Gynecology | Admitting: Obstetrics & Gynecology

## 2017-11-29 ENCOUNTER — Ambulatory Visit (INDEPENDENT_AMBULATORY_CARE_PROVIDER_SITE_OTHER)
Admission: EM | Admit: 2017-11-29 | Discharge: 2017-11-29 | Disposition: A | Discharge status: Home / Self Care | Payer: Federal, State, Local not specified - PPO | Source: Home / Self Care | Attending: Internal Medicine | Admitting: Internal Medicine

## 2017-11-29 ENCOUNTER — Encounter (HOSPITAL_COMMUNITY): Payer: Self-pay

## 2017-11-29 ENCOUNTER — Inpatient Hospital Stay (HOSPITAL_COMMUNITY): Payer: Federal, State, Local not specified - PPO

## 2017-11-29 ENCOUNTER — Encounter (HOSPITAL_COMMUNITY): Payer: Self-pay | Admitting: *Deleted

## 2017-11-29 ENCOUNTER — Other Ambulatory Visit: Payer: Self-pay

## 2017-11-29 DIAGNOSIS — Z3201 Encounter for pregnancy test, result positive: Secondary | ICD-10-CM | POA: Diagnosis not present

## 2017-11-29 DIAGNOSIS — R109 Unspecified abdominal pain: Secondary | ICD-10-CM | POA: Insufficient documentation

## 2017-11-29 DIAGNOSIS — O99011 Anemia complicating pregnancy, first trimester: Secondary | ICD-10-CM | POA: Insufficient documentation

## 2017-11-29 DIAGNOSIS — Z880 Allergy status to penicillin: Secondary | ICD-10-CM | POA: Insufficient documentation

## 2017-11-29 DIAGNOSIS — O210 Mild hyperemesis gravidarum: Secondary | ICD-10-CM | POA: Insufficient documentation

## 2017-11-29 DIAGNOSIS — R112 Nausea with vomiting, unspecified: Secondary | ICD-10-CM

## 2017-11-29 DIAGNOSIS — D649 Anemia, unspecified: Secondary | ICD-10-CM | POA: Insufficient documentation

## 2017-11-29 DIAGNOSIS — O26891 Other specified pregnancy related conditions, first trimester: Secondary | ICD-10-CM

## 2017-11-29 DIAGNOSIS — R102 Pelvic and perineal pain: Secondary | ICD-10-CM

## 2017-11-29 DIAGNOSIS — Z3A01 Less than 8 weeks gestation of pregnancy: Secondary | ICD-10-CM | POA: Insufficient documentation

## 2017-11-29 DIAGNOSIS — O3680X0 Pregnancy with inconclusive fetal viability, not applicable or unspecified: Secondary | ICD-10-CM

## 2017-11-29 DIAGNOSIS — Z3A00 Weeks of gestation of pregnancy not specified: Secondary | ICD-10-CM | POA: Diagnosis not present

## 2017-11-29 LAB — URINALYSIS, ROUTINE W REFLEX MICROSCOPIC
BILIRUBIN URINE: NEGATIVE
Glucose, UA: NEGATIVE mg/dL
Hgb urine dipstick: NEGATIVE
Ketones, ur: NEGATIVE mg/dL
Nitrite: NEGATIVE
PROTEIN: NEGATIVE mg/dL
SPECIFIC GRAVITY, URINE: 1.017 (ref 1.005–1.030)
pH: 6 (ref 5.0–8.0)

## 2017-11-29 LAB — CBC
HEMATOCRIT: 27 % — AB (ref 36.0–46.0)
Hemoglobin: 7.8 g/dL — ABNORMAL LOW (ref 12.0–15.0)
MCH: 18.1 pg — ABNORMAL LOW (ref 26.0–34.0)
MCHC: 28.9 g/dL — ABNORMAL LOW (ref 30.0–36.0)
MCV: 62.8 fL — AB (ref 78.0–100.0)
PLATELETS: 267 10*3/uL (ref 150–400)
RBC: 4.3 MIL/uL (ref 3.87–5.11)
RDW: 20.2 % — AB (ref 11.5–15.5)
WBC: 6.1 10*3/uL (ref 4.0–10.5)

## 2017-11-29 LAB — POCT URINALYSIS DIP (DEVICE)
Bilirubin Urine: NEGATIVE
Glucose, UA: NEGATIVE mg/dL
HGB URINE DIPSTICK: NEGATIVE
Ketones, ur: NEGATIVE mg/dL
Leukocytes, UA: NEGATIVE
NITRITE: NEGATIVE
PROTEIN: NEGATIVE mg/dL
Specific Gravity, Urine: 1.02 (ref 1.005–1.030)
Urobilinogen, UA: 0.2 mg/dL (ref 0.0–1.0)
pH: 7 (ref 5.0–8.0)

## 2017-11-29 LAB — HCG, QUANTITATIVE, PREGNANCY: hCG, Beta Chain, Quant, S: 4767 m[IU]/mL — ABNORMAL HIGH (ref <5)

## 2017-11-29 LAB — POCT PREGNANCY, URINE: PREG TEST UR: POSITIVE — AB

## 2017-11-29 LAB — HCG, SERUM, QUALITATIVE: Preg, Serum: POSITIVE — AB

## 2017-11-29 MED ORDER — DOXYLAMINE-PYRIDOXINE 10-10 MG PO TBEC: 10.0000 mg | DELAYED_RELEASE_TABLET | Freq: Every day | ORAL | 0 refills | Status: AC

## 2017-11-29 NOTE — Discharge Instructions (Signed)
Will need labs drawn again on Wednesday at 9 am in the [facility].  This will be a 2 hour appointment. Begin taking Diclegis and will try to start a PNV Pelvic rest - no sex, no tampons, no douching.  Nothing in the vagina. Return to [facility] if having severe lower abdominal pain or severe vaginal bleeding. Begin taking your prenatal vitamins if possible.

## 2017-11-29 NOTE — ED Provider Notes (Signed)
[facility]    CSN: [id number] Arrival date & time: 11/29/17  1846     History   Chief Complaint Chief Complaint  Patient presents with  . Abdominal Pain  . Emesis    HPI Judy Rodgers is a 22 y.o. female presenting with persistent abdominal pain and vomiting for 1-2 weeks.  Patient had taken Zofran without relief. Only able to hold down zofran. LMP December 6th, not on birth control, periods are irregular. Abdominal pain is sharp and midline. No associated diarrhea.  Denies urinary symptoms of vaginal discharge and burning with urination. Does endorse small amount of vaginal discharge and she has a history of BV. No fevers. Mild back pain. Patient has a history of a cyst since early December causing similar abdominal pain.   HPI  Past Medical History:  Diagnosis Date  . Acne   . Environmental allergies   . [id number](478.2)     Patient Active Problem List   Diagnosis Date Noted  . BV (bacterial vaginosis) 08/13/2017  . Insomnia 02/02/2014  . Abdominal pain, unspecified site 12/29/2013  . Anemia 09/14/2013  . BMI (body mass index), pediatric, 5% to less than 85% for age 34/15/2014  . Acne 08/30/2013  . Constipation 08/30/2013  . Irregular menses 08/30/2013  . Blurry vision 08/30/2013  . Tension headache 03/21/2013  . Migraine without aura and without status migrainosus, not intractable 03/21/2013    Past Surgical History:  Procedure Laterality Date  . TONSILLECTOMY    . TONSILLECTOMY    . WISDOM TOOTH EXTRACTION  09/2015    OB History    Gravida Para Term Preterm AB Living   0 0 0 0 0 0   SAB TAB Ectopic Multiple Live Births   0 0 0 0         Home Medications    Prior to Admission medications   Medication Sig Start Date End Date Taking? Authorizing Provider  Doxylamine-Pyridoxine 10-10 MG TBEC Take 10 mg by mouth daily for 7 days. May take 2 pills to start 11/29/17 12/06/17  Lew Dawes, PA-C    Family History Family History  Problem  Relation Age of Onset  . Depression Mother     Social History Social History   Tobacco Use  . Smoking status: Never Smoker  . Smokeless tobacco: Never Used  Substance Use Topics  . Alcohol use: No  . Drug use: No     Allergies   Penicillins; Amoxicillin-pot clavulanate; and Metronidazole   Review of Systems Review of Systems  Constitutional: Negative for fever.  Respiratory: Negative for shortness of breath.   Cardiovascular: Negative for chest pain.  Gastrointestinal: Positive for abdominal pain, nausea and vomiting. Negative for diarrhea.  Genitourinary: Positive for menstrual problem and vaginal discharge. Negative for dysuria, flank pain, genital sores, hematuria, vaginal bleeding and vaginal pain.  Musculoskeletal: Positive for back pain.  Skin: Negative for rash.  Neurological: Negative for dizziness, light-headedness and headaches.     Physical Exam Triage Vital Signs ED Triage Vitals  Enc Vitals Group     BP 11/29/17 1908 140/72     Pulse Rate 11/29/17 1908 86     Resp 11/29/17 1908 18     Temp 11/29/17 1908 98.8 F (37.1 C)     Temp Source 11/29/17 1908 Oral     SpO2 11/29/17 1908 100 %     Weight --      Height --      Head Circumference --  Peak Flow --      Pain Score 11/29/17 1925 6     Pain Loc --      Pain Edu? --      Excl. in GC? --    No data found.  Updated Vital Signs BP 140/72 (BP Location: Left Arm)   Pulse 86   Temp 98.8 F (37.1 C) (Oral)   Resp 18   LMP 10/22/2017 (Approximate)   SpO2 100%    Physical Exam  Constitutional: She appears well-developed and well-nourished. She does not appear ill. No distress.  HENT:  Head: Normocephalic and atraumatic.  Mouth/Throat: Oropharynx is clear and moist.  Eyes: Conjunctivae are normal.  Neck: Neck supple.  Cardiovascular: Normal rate and regular rhythm.  No murmur heard. Pulmonary/Chest: Effort normal and breath sounds normal. No respiratory distress.  Abdominal: Soft.  There is tenderness in the suprapubic area. There is no rigidity, no rebound, no guarding and no CVA tenderness.  Musculoskeletal: She exhibits no edema.  Neurological: She is alert.  Skin: Skin is warm and dry.  Psychiatric: She has a normal mood and affect.  Nursing note and vitals reviewed.    UC Treatments / Results  Labs (all labs ordered are listed, but only abnormal results are displayed) Labs Reviewed  POCT PREGNANCY, URINE - Abnormal; Notable for the following components:      Result Value   Preg Test, Ur POSITIVE (*)    All other components within normal limits  HCG, SERUM, QUALITATIVE  POCT URINALYSIS DIP (DEVICE)  CERVICOVAGINAL ANCILLARY ONLY    EKG  EKG Interpretation None       Radiology No results found.  Procedures Procedures (including critical care time)  Medications Ordered in UC Medications - No data to display   Initial Impression / Assessment and Plan / UC Course  I have reviewed the triage vital signs and the nursing notes.  Pertinent labs & imaging results that were available during my care of the patient were reviewed by me and considered in my medical decision making (see chart for details).     Patient with positive pregnancy test and abdominal pain; pain is not severe but persisting early on. Will send to women's for confirmation that pregnancy is intrauterine vs. ectopic. UA negative. Vaginal swab to check for STD's and BV/yeast. Prenatal vitamin discussed. Diclegis prescribed, OTC option instructions also provided. Discussed strict return precautions. Patient verbalized understanding and is agreeable with plan.  F/U with women's clinic.  Final Clinical Impressions(s) / UC Diagnoses   Final diagnoses:  Positive pregnancy test  Suprapubic pain  Nausea and vomiting, intractability of vomiting not specified, unspecified vomiting type    ED Discharge Orders        Ordered    Doxylamine-Pyridoxine 10-10 MG TBEC  Daily     11/29/17  2013       Controlled Substance Prescriptions [place] Controlled Substance Registry consulted? Not Applicable   Lew DawesWieters, Ryver Zadrozny C, [place]-C 11/29/17 2037

## 2017-11-29 NOTE — MAU Provider Note (Signed)
History     CSN: [id number]  Arrival date and time: 11/29/17 2043   First Provider Initiated Contact with Patient 11/29/17 2108      Chief Complaint  Patient presents with  . Abdominal Pain   HPI Judy Rodgers 22 y.o. [redacted]w[redacted]d  Client was seen at [facility] earlier today and sent to MAU to have evaluation done to determine location of the pregnancy as she has had abdominal pain for 4-5 days.  Is having persistent vomiting as well and was prescribed Diclegis.  Note and labs from from Urgent Care reviewed.  OB History    Gravida Para Term Preterm AB Living   1 0 0 0 0 0   SAB TAB Ectopic Multiple Live Births   0 0 0 0        Past Medical History:  Diagnosis Date  . Acne   . Environmental allergies   . Headache(784.0)    chronic ha takes motrin 800mg  PRN    Past Surgical History:  Procedure Laterality Date  . TONSILLECTOMY    . TONSILLECTOMY    . WISDOM TOOTH EXTRACTION  09/2015    Family History  Family history unknown: Yes    Social History   Tobacco Use  . Smoking status: Never Smoker  . Smokeless tobacco: Never Used  Substance Use Topics  . Alcohol use: No  . Drug use: Yes    Types: Other-see comments    Comment: marijuana - 30 days    Allergies:  Allergies  Allergen Reactions  . Penicillins Nausea And Vomiting    Has patient had a PCN reaction causing immediate rash, facial/tongue/throat swelling, SOB or lightheadedness with hypotension: No Has patient had a PCN reaction causing severe rash involving mucus membranes or skin necrosis: No Has patient had a PCN reaction that required hospitalization: No Has patient had a PCN reaction occurring within the last 10 years: No If all of the above answers are "NO", then may proceed with Cephalosporin use.   Marland Kitchen Amoxicillin-Pot Clavulanate Nausea And Vomiting and Other (See Comments)    vertigo  . Metronidazole Nausea And Vomiting and Rash    Nausea - tablets, rash - gel    Medications Prior to  Admission  Medication Sig Dispense Refill Last Dose  . Doxylamine-Pyridoxine 10-10 MG TBEC Take 10 mg by mouth daily for 7 days. May take 2 pills to start 15 tablet 0     Review of Systems  Constitutional: Negative for fever.  HENT: Negative.   Respiratory: Negative for shortness of breath.   Gastrointestinal: Positive for abdominal pain, nausea and vomiting.  Genitourinary: Negative for vaginal bleeding and vaginal discharge.   Physical Exam   Blood pressure 137/86, pulse 96, temperature 98.1 F (36.7 C), temperature source Oral, resp. rate 18, height 5' 2.5" (1.588 m), weight 160 lb (72.6 kg), last menstrual period 10/22/2017, SpO2 100 %.  Physical Exam  Nursing note and vitals reviewed. Constitutional: She is oriented to person, place, and time. She appears well-developed and well-nourished.  HENT:  Head: Normocephalic.  Eyes: EOM are normal.  Neck: Neck supple.  Respiratory: Effort normal.  GI: Soft. There is no tenderness. There is no rebound and no guarding.  Musculoskeletal: Normal range of motion.  Neurological: She is alert and oriented to person, place, and time.  Skin: Skin is warm and dry.  Psychiatric: She has a normal mood and affect.    MAU Course  Procedures Results for orders placed or performed during the  hospital encounter of 11/29/17 (from the past 24 hour(s))  Urinalysis, Routine w reflex microscopic     Status: Abnormal   Collection Time: 11/29/17  9:00 PM  Result Value Ref Range   Color, Urine YELLOW YELLOW   APPearance HAZY (A) CLEAR   Specific Gravity, Urine 1.017 1.005 - 1.030   pH 6.0 5.0 - 8.0   Glucose, UA NEGATIVE NEGATIVE mg/dL   Hgb urine dipstick NEGATIVE NEGATIVE   Bilirubin Urine NEGATIVE NEGATIVE   Ketones, ur NEGATIVE NEGATIVE mg/dL   Protein, ur NEGATIVE NEGATIVE mg/dL   Nitrite NEGATIVE NEGATIVE   Leukocytes, UA TRACE (A) NEGATIVE   RBC / HPF 0-5 0 - 5 RBC/hpf   WBC, UA 0-5 0 - 5 WBC/hpf   Bacteria, UA RARE (A) NONE SEEN    Squamous Epithelial / LPF 6-30 (A) NONE SEEN   Mucus PRESENT   CBC     Status: Abnormal   Collection Time: 11/29/17  9:10 PM  Result Value Ref Range   WBC 6.1 4.0 - 10.5 K/uL   RBC 4.30 3.87 - 5.11 MIL/uL   Hemoglobin 7.8 (L) 12.0 - 15.0 g/dL   HCT 16.127.0 (L) 09.636.0 - 04.546.0 %   MCV 62.8 (L) 78.0 - 100.0 fL   MCH 18.1 (L) 26.0 - 34.0 pg   MCHC 28.9 (L) 30.0 - 36.0 g/dL   RDW 40.920.2 (H) 81.111.5 - 91.415.5 %   Platelets 267 150 - 400 K/uL  hCG, quantitative, pregnancy     Status: Abnormal   Collection Time: 11/29/17  9:10 PM  Result Value Ref Range   hCG, Beta Chain, Quant, S 4,767 (H) <5 mIU/mL  ABO/Rh     Status: None (Preliminary result)   Collection Time: 11/29/17  9:10 PM  Result Value Ref Range   ABO/RH(D) B POS     MDM Preliminary US reviewed - IUGS sac but no yolk sac and no embryo seen.  Cannot confirm that this pregnancy is intrauterine. Reviewed lab results and diagnoses with client.  Answered their questions.  States they can come to [facility] at 9 am on Wed.    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  This will be a 2 hour appointment. Begin taking Diclegis and will try to start a PNV Pelvic rest - no sex, no tampons, no douching.  Nothing in the vagina. Return to MAU if having severe lower abdominal pain or severe vaginal bleeding.   Desaray Marschner L Hadi Dubin 11/29/2017, 10:27 PM

## 2017-11-29 NOTE — ED Triage Notes (Signed)
C/O constant "sharp pains" across low abd x 3 days with nausea and vomiting.  Able to keep down PO fluids, but no food.  Denies fevers or diarrhea.  Hast tried taking Zofran without any relief.

## 2017-11-29 NOTE — Discharge Instructions (Addendum)
Pregnancy Test was positive today. Please start a prenatal vitamin with folic acid. This is likely a cause of your abdominal pain and nausea. We are checking for STD's and BV/Yeast also.   Since you are having abdominal pain we would like for you have an ultrasound to confirm that the pregnancy is in the uterus and not the fallopian tubes. Please [facility] to [facility] for this.   Diclegis: Two tablets at bedtime on day 1 and 2; if symptoms persist, take 1 tablet in morning and 2 tablets at bedtime on day 3; if symptoms persist, may increase to 1 tablet in morning, 1 tablet mid-afternoon, and 2 tablets at bedtime on day 4 (maximum: doxylamine 40 mg/pyridoxine 40 mg (4 tablets) per day). OR One-half of the 25 mg Unisom sleep tablet over-the-counter tablet or two chewable 5 mg tablets can be used off-label as an antiemetic. In addition, pyridoxine 25 mg, also available over-the-counter, is taken three or four times per day;This is a reasonable, less expensive substitute for combination tablets.  Please try to stay hydrated to prevent dehydration.   [facility] should be able to help with letter for financial assistance.

## 2017-11-29 NOTE — Progress Notes (Addendum)
G1 @ early pregnancy. Here dt lower abdominal pain.  Hx of Left ovarian cyst dx early dec. States appt in Feb for re-evaluate cyst.   2119: lab at bs  2120: U/S paged and U/S returned page.   2121: Pt walked to u/s

## 2017-11-29 NOTE — MAU Note (Signed)
Pt here with c/o abdominal pain for past 3 days. Denies any bleeding. Positive pregnancy test at urgent care.

## 2017-11-30 LAB — CERVICOVAGINAL ANCILLARY ONLY
Bacterial vaginitis: NEGATIVE
CANDIDA VAGINITIS: POSITIVE — AB
Chlamydia: NEGATIVE
Neisseria Gonorrhea: NEGATIVE
Trichomonas: NEGATIVE

## 2017-11-30 LAB — ABO/RH: ABO/RH(D): B POS

## 2017-12-01 ENCOUNTER — Telehealth (HOSPITAL_COMMUNITY): Payer: Self-pay | Admitting: Internal Medicine

## 2017-12-01 MED ORDER — TERCONAZOLE 0.4 % VA CREA: 1.0000 | TOPICAL_CREAM | Freq: Every day | VAGINAL | 0 refills | Status: AC

## 2017-12-01 NOTE — Telephone Encounter (Signed)
Clinical staff, please let patient know that test for candida (yeast) was positive.  Rx terconazole cream was sent to the pharmacy of record, [facility] on [address].  [facility] as [facility]  LM

## 2017-12-02 ENCOUNTER — Ambulatory Visit: Payer: Federal, State, Local not specified - PPO | Admitting: General Practice

## 2017-12-02 DIAGNOSIS — O3680X0 Pregnancy with inconclusive fetal viability, not applicable or unspecified: Secondary | ICD-10-CM

## 2017-12-02 DIAGNOSIS — Z331 Pregnant state, incidental: Secondary | ICD-10-CM

## 2017-12-02 LAB — HCG, QUANTITATIVE, PREGNANCY: hCG, Beta Chain, Quant, S: 14983 m[IU]/mL — ABNORMAL HIGH (ref <5)

## 2017-12-02 MED ORDER — PRENATAL VITAMINS 0.8 MG PO TABS: 1.0000 | ORAL_TABLET | Freq: Every day | ORAL | 12 refills | Status: DC

## 2017-12-02 NOTE — Addendum Note (Signed)
Addended by: Kathee DeltonHILLMAN, Deloris Moger L on: 12/02/2017 11:05 AM   Modules accepted: Orders

## 2017-12-02 NOTE — Progress Notes (Signed)
Patient here for stat bhcg today. Patient denies pain or bleeding. Discussed with patient we are monitoring her pregnancy hormone levels today. Asked she wait in lobby for results/updated plan of care. Patient verbalized understanding and had no questions at this time.  Reviewed results with Dr Debroah LoopArnold who finds bhcg levels increasing, likely indicating pregnancy progression- patient should have follow up ultrasound on Friday. Scheduled for 1/18 @ 3:45pm.   Informed patient of results & u/s appt. Patient verbalized understanding to all and had no questions

## 2017-12-03 DIAGNOSIS — K080 Exfoliation of teeth due to systemic causes: Secondary | ICD-10-CM | POA: Diagnosis not present

## 2017-12-04 ENCOUNTER — Ambulatory Visit (HOSPITAL_COMMUNITY)
Admission: RE | Admit: 2017-12-04 | Discharge: 2017-12-04 | Disposition: A | Discharge status: Home / Self Care | Payer: Federal, State, Local not specified - PPO | Source: Ambulatory Visit | Attending: Obstetrics & Gynecology | Admitting: Obstetrics & Gynecology

## 2017-12-04 ENCOUNTER — Telehealth: Payer: Self-pay | Admitting: *Deleted

## 2017-12-04 ENCOUNTER — Ambulatory Visit: Payer: Federal, State, Local not specified - PPO | Admitting: *Deleted

## 2017-12-04 DIAGNOSIS — O3680X0 Pregnancy with inconclusive fetal viability, not applicable or unspecified: Secondary | ICD-10-CM

## 2017-12-04 DIAGNOSIS — Z3490 Encounter for supervision of normal pregnancy, unspecified, unspecified trimester: Secondary | ICD-10-CM | POA: Insufficient documentation

## 2017-12-04 DIAGNOSIS — Z712 Person consulting for explanation of examination or test findings: Secondary | ICD-10-CM

## 2017-12-04 DIAGNOSIS — Z3A01 Less than 8 weeks gestation of pregnancy: Secondary | ICD-10-CM | POA: Diagnosis not present

## 2017-12-04 DIAGNOSIS — O26891 Other specified pregnancy related conditions, first trimester: Secondary | ICD-10-CM | POA: Diagnosis not present

## 2017-12-04 DIAGNOSIS — R109 Unspecified abdominal pain: Secondary | ICD-10-CM | POA: Diagnosis not present

## 2017-12-04 NOTE — Telephone Encounter (Signed)
Judy Rodgers called 12/02/17 and left a message she was in the office and d/c notes said something about rx for yeast but no one told her she had yeast. Wants to make sure is ok to use it.  I called Judy Rodgers and she reports the medicine was prescribed by urgent care who did the yeast test and they told her to ask Judy Rodgers about the medicine. I informed her we use terazole for yeast infections but only for 3 nights, not 7. She voices understanding.

## 2017-12-04 NOTE — Progress Notes (Signed)
US results reviewed with Dr. Rachelle HoraMoss - probable normal progression of pregnancy however still too early to see fetal pole and confirm viability. Pt denies having any pain or vaginal bleeding. Plan of care is for BHCG today. If appropriate rise, pt will have repeat US in 2 weeks. All information discussed with pt and family members. She voiced understanding and had no questions.

## 2017-12-05 LAB — BETA HCG QUANT (REF LAB): HCG QUANT: 15336 m[IU]/mL

## 2017-12-05 NOTE — Progress Notes (Signed)
Agree with nursing staff's documentation of this patient's clinic encounter.  Reviewed repeat hcg and now 15,000. Will need repeat ultrasound in 2 weeks. Sent message to staff to schedule with patient.  Rolm BookbinderAmber Saul Fabiano, DO

## 2017-12-08 ENCOUNTER — Telehealth: Payer: Self-pay | Admitting: *Deleted

## 2017-12-08 DIAGNOSIS — O3680X0 Pregnancy with inconclusive fetal viability, not applicable or unspecified: Secondary | ICD-10-CM

## 2017-12-08 NOTE — Telephone Encounter (Signed)
Judy LenisLaquanda called again this am and left voicemessage asking for results of bhcg.

## 2017-12-08 NOTE — Telephone Encounter (Signed)
Judy Rodgers called 12/07/17 and left a message she is calling to get results of her pregnancy hormone test to see if pregnancy is ok

## 2017-12-09 NOTE — Telephone Encounter (Signed)
Reviewed results/[place] with [place],CNM and ordered to have us in one week from last US not 2 weeks. Scheduled US for 12/11/17 0930. Called Erit[place]quanda and informed her of results bhcg did rise but not appropriate- recommend US this Friday. Answered her questions and she agrees to plan of care. Also reviewed ectopic precautions with her.

## 2017-12-11 ENCOUNTER — Ambulatory Visit: Payer: Federal, State, Local not specified - PPO | Admitting: General Practice

## 2017-12-11 ENCOUNTER — Encounter: Payer: Self-pay | Admitting: Obstetrics & Gynecology

## 2017-12-11 ENCOUNTER — Ambulatory Visit (HOSPITAL_COMMUNITY)
Admission: RE | Admit: 2017-12-11 | Discharge: 2017-12-11 | Disposition: A | Discharge status: Home / Self Care | Payer: Federal, State, Local not specified - PPO | Source: Ambulatory Visit | Attending: Family Medicine | Admitting: Family Medicine

## 2017-12-11 DIAGNOSIS — Z712 Person consulting for explanation of examination or test findings: Secondary | ICD-10-CM

## 2017-12-11 DIAGNOSIS — O3680X0 Pregnancy with inconclusive fetal viability, not applicable or unspecified: Secondary | ICD-10-CM

## 2017-12-11 DIAGNOSIS — Z3490 Encounter for supervision of normal pregnancy, unspecified, unspecified trimester: Secondary | ICD-10-CM | POA: Diagnosis not present

## 2017-12-11 DIAGNOSIS — Z3A01 Less than 8 weeks gestation of pregnancy: Secondary | ICD-10-CM | POA: Diagnosis not present

## 2017-12-11 DIAGNOSIS — Z3491 Encounter for supervision of normal pregnancy, unspecified, first trimester: Secondary | ICD-10-CM | POA: Diagnosis not present

## 2017-12-11 NOTE — Progress Notes (Signed)
I have reviewed the chart and agree with nursing staff's documentation of this patient's encounter.  Julie Wenzel, PA-C 12/11/2017 12:11 PM    

## 2017-12-11 NOTE — Progress Notes (Signed)
Patient here for viability results today. Reviewed with Vonzella NippleJulie Wenzel who finds living IUP. Patient should begin prenatal care in next 3-4 weeks in high risk clinic to due bicornate or septate uterus.   Informed patient of results, provided pictures, and reviewed dating. Recommended she begin prenatal care in our office in next 3-4 weeks due to uterine abnormality and to continue PNV. Patient verbalized understanding to all & had no questions

## 2017-12-15 ENCOUNTER — Encounter (HOSPITAL_COMMUNITY): Payer: Self-pay | Admitting: *Deleted

## 2017-12-15 ENCOUNTER — Inpatient Hospital Stay (HOSPITAL_COMMUNITY)
Admission: AD | Admit: 2017-12-15 | Discharge: 2017-12-15 | Disposition: A | Discharge status: Home / Self Care | Payer: Federal, State, Local not specified - PPO | Source: Ambulatory Visit | Attending: Obstetrics and Gynecology | Admitting: Obstetrics and Gynecology

## 2017-12-15 DIAGNOSIS — Z3A01 Less than 8 weeks gestation of pregnancy: Secondary | ICD-10-CM | POA: Diagnosis not present

## 2017-12-15 DIAGNOSIS — O210 Mild hyperemesis gravidarum: Secondary | ICD-10-CM | POA: Diagnosis not present

## 2017-12-15 DIAGNOSIS — Z880 Allergy status to penicillin: Secondary | ICD-10-CM | POA: Diagnosis not present

## 2017-12-15 DIAGNOSIS — O219 Vomiting of pregnancy, unspecified: Secondary | ICD-10-CM | POA: Diagnosis not present

## 2017-12-15 LAB — URINALYSIS, ROUTINE W REFLEX MICROSCOPIC
Bilirubin Urine: NEGATIVE
Glucose, UA: NEGATIVE mg/dL
Hgb urine dipstick: NEGATIVE
Ketones, ur: NEGATIVE mg/dL
Leukocytes, UA: NEGATIVE
Nitrite: NEGATIVE
Protein, ur: NEGATIVE mg/dL
Specific Gravity, Urine: 1.013 (ref 1.005–1.030)
pH: 8 (ref 5.0–8.0)

## 2017-12-15 MED ORDER — PROMETHAZINE HCL 25 MG PO TABS
25.0000 mg | ORAL_TABLET | Freq: Four times a day (QID) | ORAL | 3 refills | Status: DC | PRN
Start: 2017-12-15 — End: 2018-04-09

## 2017-12-15 MED ORDER — PROMETHAZINE HCL 25 MG PO TABS
25.0000 mg | ORAL_TABLET | Freq: Once | ORAL | Status: AC
  Administered 2017-12-15: 25 mg via ORAL
  Filled 2017-12-15: qty 1

## 2017-12-15 NOTE — MAU Note (Signed)
Pt presents with c/o inability to keep anything for past 3 days.  Reports no meds being taken, previously prescribed Diclegis, reports insurance won't accept.

## 2017-12-15 NOTE — MAU Provider Note (Signed)
Chief Complaint: Emesis   First Provider Initiated Contact with Patient 12/15/17 1556      SUBJECTIVE HPI: Judy Rodgers is a 22 y.o. G1P0000 at 4061w5d by LMP who presents to maternity admissions reporting nausea and vomiting. She reports continued N/V since finding out she was pregnant. Was prescribed diclegis but insurance would not cover it. Reports not being able to keep anything down except juice and water for the past three days. Reports vomiting 2-3 times a day. She denies abdominal pain or cramping. She denies vaginal bleeding, vaginal itching/burning, urinary symptoms, h/a, dizziness, or fever/chills.  [facility]uate [facility].    Past Medical History:  Diagnosis Date  . Acne   . Environmental allergies   . Headache(784.0)    chronic ha takes motrin 800mg  PRN   Past Surgical History:  Procedure Laterality Date  . TONSILLECTOMY    . TONSILLECTOMY    . WISDOM TOOTH EXTRACTION  09/2015   Social History   Socioeconomic History  . Marital status: Single    Spouse name: Not on file  . Number of children: Not on file  . Years of education: Not on file  . Highest education level: Not on file  Social Needs  . Financial resource strain: Not on file  . Food insecurity - worry: Not on file  . Food insecurity - inability: Not on file  . Transportation needs - medical: Not on file  . Transportation needs - non-medical: Not on file  Occupational History  . Not on file  Tobacco Use  . Smoking status: Never Smoker  . Smokeless tobacco: Never Used  Substance and Sexual Activity  . Alcohol use: No  . Drug use: Yes    Types: Other-see comments    Comment: marijuana - 30 days  . Sexual activity: Yes    Partners: Male    Birth control/protection: None  Other Topics Concern  . Not on file  Social History Narrative  . Not on file   No current facility-administered medications on file prior to encounter.    Current Outpatient Medications  on File Prior to Encounter  Medication Sig Dispense Refill  . Prenatal Multivit-Min-Fe-FA (PRENATAL VITAMINS) 0.8 MG tablet Take 1 tablet by mouth daily. 30 tablet 12   Allergies  Allergen Reactions  . Penicillins Nausea And Vomiting    Has patient had a PCN reaction causing immediate rash, facial/tongue/throat swelling, SOB or lightheadedness with hypotension: No Has patient had a PCN reaction causing severe rash involving mucus membranes or skin necrosis: No Has patient had a PCN reaction that required hospitalization: No Has patient had a PCN reaction occurring within the last 10 years: No If all of the above answers are "NO", then may proceed with Cephalosporin use.   Marland Kitchen. Amoxicillin-Pot Clavulanate Nausea And Vomiting and Other (See Comments)    vertigo  . Metronidazole Nausea And Vomiting and Rash    Nausea - tablets, rash - gel    ROS:  Review of Systems  Constitutional: Negative.   Respiratory: Negative.   Cardiovascular: Negative.   Gastrointestinal: Positive for nausea and vomiting. Negative for abdominal pain, constipation and diarrhea.  Genitourinary: Negative.   Musculoskeletal: Negative.   Neurological: Negative.   Psychiatric/Behavioral: Negative.    I have reviewed patient's Past Medical Hx, Surgical Hx, Family Hx, Social Hx, medications and allergies.   Physical Exam   Patient Vitals for the past 24 hrs:  BP Temp Temp src Pulse Resp SpO2 Weight  12/15/17 1807 125/73 98.1 F (36.7 C) Oral (!) 118 18 100 % -  12/15/17 1536 132/64 98.6 F (37 C) Oral 81 17 100 % 154 lb 1.9 oz (69.9 kg)   Constitutional: Well-developed, well-nourished female in no acute distress.  Cardiovascular: normal rate Respiratory: normal effort GI: Abd soft, non-tender. Pos BS x 4 MS: Extremities nontender, no edema, normal ROM Neurologic: Alert and oriented x 4.  GU: Neg CVAT. PELVIC EXAM: deferred    LAB RESULTS Results for orders placed or performed during the hospital  encounter of 12/15/17 (from the past 24 hour(s))  Urinalysis, Routine w reflex microscopic     Status: Abnormal   Collection Time: 12/15/17  3:34 PM  Result Value Ref Range   Color, Urine YELLOW YELLOW   APPearance HAZY (A) CLEAR   Specific Gravity, Urine 1.013 1.005 - 1.030   pH 8.0 5.0 - 8.0   Glucose, UA NEGATIVE NEGATIVE mg/dL   Hgb urine dipstick NEGATIVE NEGATIVE   Bilirubin Urine NEGATIVE NEGATIVE   Ketones, ur NEGATIVE NEGATIVE mg/dL   Protein, ur NEGATIVE NEGATIVE mg/dL   Nitrite NEGATIVE NEGATIVE   Leukocytes, UA NEGATIVE NEGATIVE    --/--/B POS (01/13 2110)   MAU Management/MDM: Orders Placed This Encounter  Procedures  . Urinalysis, Routine w reflex microscopic    Meds ordered this encounter  Medications  . promethazine (PHENERGAN) tablet 25 mg    Treatments in MAU included Phenergan 25mg  PO for N/V- patient given crackers and juice after medication, patient able to tolerate food with no vomited. Pt discharged. Pt stable at time of discharge  ASSESSMENT 1. Nausea and vomiting during pregnancy     PLAN Discharge home Rx for Phenergan sent to pharmacy of choice  Follow up as scheduled in the clinic Return to MAU as needed for emergencies    Allergies as of 12/15/2017      Reactions   Penicillins Nausea And Vomiting   Has patient had a PCN reaction causing immediate rash, facial/tongue/throat swelling, SOB or lightheadedness with hypotension: No Has patient had a PCN reaction causing severe rash involving mucus membranes or skin necrosis: No Has patient had a PCN reaction that required hospitalization: No Has patient had a PCN reaction occurring within the last 10 years: No If all of the above answers are "NO", then may proceed with Cephalosporin use.   Amoxicillin-pot Clavulanate Nausea And Vomiting, Other (See Comments)   vertigo   Metronidazole Nausea And Vomiting, Rash   Nausea - tablets, rash - gel      Medication List    STOP taking these  medications   ibuprofen 200 MG tablet Commonly known as:  ADVIL,MOTRIN     TAKE these medications   Prenatal Vitamins 0.8 MG tablet Take 1 tablet by mouth daily.   promethazine 25 MG tablet Commonly known as:  PHENERGAN Take 1 tablet (25 mg total) by mouth every 6 (six) hours as needed for nausea or vomiting.       Steward Drone  Certified Nurse-Midwife 12/15/2017  7:02 PM

## 2017-12-15 NOTE — MAU Note (Signed)
Patient c/o  +nausea/vomiting Endorses not being able to keep anything to eat or drink down Reports 2-3 times in a day  Denies pain or bleeding at this time.   LMP 10/26/17

## 2017-12-15 NOTE — Discharge Instructions (Signed)

## 2017-12-28 ENCOUNTER — Encounter: Payer: Self-pay | Admitting: Obstetrics and Gynecology

## 2017-12-28 ENCOUNTER — Ambulatory Visit (INDEPENDENT_AMBULATORY_CARE_PROVIDER_SITE_OTHER): Payer: Federal, State, Local not specified - PPO | Admitting: Obstetrics and Gynecology

## 2017-12-28 VITALS — BP 131/72 | HR 101 | Wt 151.0 lb

## 2017-12-28 DIAGNOSIS — Z1151 Encounter for screening for human papillomavirus (HPV): Secondary | ICD-10-CM | POA: Diagnosis not present

## 2017-12-28 DIAGNOSIS — O0990 Supervision of high risk pregnancy, unspecified, unspecified trimester: Secondary | ICD-10-CM | POA: Insufficient documentation

## 2017-12-28 DIAGNOSIS — O34591 Maternal care for other abnormalities of gravid uterus, first trimester: Secondary | ICD-10-CM

## 2017-12-28 DIAGNOSIS — Z113 Encounter for screening for infections with a predominantly sexual mode of transmission: Secondary | ICD-10-CM

## 2017-12-28 DIAGNOSIS — Z124 Encounter for screening for malignant neoplasm of cervix: Secondary | ICD-10-CM

## 2017-12-28 DIAGNOSIS — O34599 Maternal care for other abnormalities of gravid uterus, unspecified trimester: Secondary | ICD-10-CM | POA: Insufficient documentation

## 2017-12-28 DIAGNOSIS — R87610 Atypical squamous cells of undetermined significance on cytologic smear of cervix (ASC-US): Secondary | ICD-10-CM | POA: Diagnosis not present

## 2017-12-28 DIAGNOSIS — G43009 Migraine without aura, not intractable, without status migrainosus: Secondary | ICD-10-CM

## 2017-12-28 DIAGNOSIS — O0991 Supervision of high risk pregnancy, unspecified, first trimester: Secondary | ICD-10-CM

## 2017-12-28 DIAGNOSIS — K5900 Constipation, unspecified: Secondary | ICD-10-CM

## 2017-12-28 LAB — POCT URINALYSIS DIP (DEVICE)
Bilirubin Urine: NEGATIVE
Glucose, UA: NEGATIVE mg/dL
Hgb urine dipstick: NEGATIVE
Ketones, ur: 15 mg/dL — AB
Leukocytes, UA: NEGATIVE
Nitrite: NEGATIVE
PH: 7 (ref 5.0–8.0)
PROTEIN: 30 mg/dL — AB
Specific Gravity, Urine: 1.025 (ref 1.005–1.030)
Urobilinogen, UA: 0.2 mg/dL (ref 0.0–1.0)

## 2017-12-28 NOTE — Progress Notes (Signed)
FHR obtained via bedside ultrasound, 175bpm

## 2017-12-28 NOTE — Patient Instructions (Signed)
For colds and allergies  Any anti-histamine including benadryl, allegra, claritin, etc.  Sudafed but not phenylephrine  Mucinex  Robitussin  For Reflux/heartburn  Pepcid Zantac Tums Prilosec Prevacid  For yeast infections  Monistat  For constipation  Colace 100 mg twice daily  For minor aches and pains  Tylenol-do not take more than 4000mg  in 24 hours. Therma-care or like heat packs

## 2017-12-28 NOTE — Progress Notes (Signed)
Dictation #1 [id number]  [id number]   INITIAL PRENATAL VISIT NOTE  Subjective:  Judy Rodgers is a 22 y.o. G1P0000 at 7144w4d by early US being seen today for her initial prenatal visit. This is an unplanned pregnancy. She and partner are happy with the pregnancy. She was using Nexplanon for birth control previously. She has an obstetric history significant for n/a. She has a medical history significant for migraines and allergies.  Patient reports no complaints.  Contractions: Not present. Vag. Bleeding: None.  Movement: Absent. Denies leaking of fluid.   Past Medical History:  Diagnosis Date  . Acne   . Environmental allergies   . Headache(784.0)    chronic ha takes motrin 800mg  PRN    Past Surgical History:  Procedure Laterality Date  . TONSILLECTOMY    . TONSILLECTOMY    . WISDOM TOOTH EXTRACTION  09/2015    OB History  Gravida Para Term Preterm AB Living  1 0 0 0 0 0  SAB TAB Ectopic Multiple Live Births  0 0 0 0 0    # Outcome Date GA Lbr Len/2nd Weight Sex Delivery Anes PTL Lv  1 Current               Social History   Socioeconomic History  . Marital status: Single    Spouse name: None  . Number of children: None  . Years of education: None  . Highest education level: None  Social Needs  . Financial resource strain: None  . Food insecurity - worry: None  . Food insecurity - inability: None  . Transportation needs - medical: None  . Transportation needs - non-medical: None  Occupational History  . None  Tobacco Use  . Smoking status: Never Smoker  . Smokeless tobacco: Never Used  Substance and Sexual Activity  . Alcohol use: No  . Drug use: No    Comment: marijuana - maybe in early pregnancy  . Sexual activity: Yes    Partners: Male    Birth control/protection: None  Other Topics Concern  . None  Social History Narrative  . None    Family History  Problem Relation Age of Onset  . Heart disease Maternal Grandmother      (Not in a  hospital admission)  Allergies  Allergen Reactions  . Penicillins Nausea And Vomiting    Has patient had a PCN reaction causing immediate rash, facial/tongue/throat swelling, SOB or lightheadedness with hypotension: No Has patient had a PCN reaction causing severe rash involving mucus membranes or skin necrosis: No Has patient had a PCN reaction that required hospitalization: No Has patient had a PCN reaction occurring within the last 10 years: No If all of the above answers are "NO", then may proceed with Cephalosporin use.   Marland Kitchen. Amoxicillin-Pot Clavulanate Nausea And Vomiting and Other (See Comments)    vertigo  . Metronidazole Nausea And Vomiting and Rash    Nausea - tablets, rash - gel    Review of Systems: Negative except for what is mentioned in HPI.  Objective:   Vitals:   12/28/17 1409  BP: 131/72  Pulse: (!) 101  Weight: 151 lb (68.5 kg)    Fetal Status: Fetal Heart Rate (bpm): 175   Movement: Absent     Physical Exam: BP 131/72   Pulse (!) 101   Wt 151 lb (68.5 kg)   LMP 10/22/2017 (Exact Date)   BMI 27.18 kg/m  CONSTITUTIONAL: Well-developed, well-nourished female in no acute distress.  NEUROLOGIC: Alert and oriented  to person, place, and time. Normal reflexes, muscle tone coordination. No cranial nerve deficit noted. PSYCHIATRIC: Normal mood and affect. Normal behavior. Normal judgment and thought content. SKIN: Skin is warm and dry. No rash noted. Not diaphoretic. No erythema. No pallor. HENT:  Normocephalic, atraumatic, External right and left ear normal. Oropharynx is clear and moist EYES: Conjunctivae and EOM are normal. Pupils are equal, round, and reactive to light. No scleral icterus.  NECK: Normal range of motion, supple, no masses CARDIOVASCULAR: Normal heart rate noted, regular rhythm RESPIRATORY: Effort and breath sounds normal, no problems with respiration noted BREASTS: symmetric, non-tender, no masses palpable, bilateral nipple rings  present ABDOMEN: Soft, nontender, nondistended, gravid. GU: normal appearing external female genitalia, nulliparous normal appearing cervix, scant white discharge in vagina, no lesions noted Bimanual: 14 weeks sized uterus, no adnexal tenderness or palpable lesions noted MUSCULOSKELETAL: Normal range of motion. EXT:  No edema and no tenderness. 2+ distal pulses.   Assessment and Plan:  Pregnancy: G1P0000 at [redacted]w[redacted]d by [place]  1. Supervision of high risk pregnancy, antepartum - Culture, OB Urine - Flu Vaccine QUAD 36+ mos IM - Hemoglobinopathy Evaluation - Obstetric Panel, Including HIV - SMN1 COPY NUMBER ANALYSIS (SMA Carrier Screen) - Enroll patient in Babyscripts Program - Cytology - PAP -would like panorama, too early to draw today Counseled regarding risks/benefits of flu vaccine, patient declines vaccine.   2. 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 Constipation, unspecified constipation type To start colace  4. Migraine without aura and without status migrainosus, not intractable Reviewed safe meds in pregnancy   Preterm labor symptoms and general obstetric precautions including but not limited to vaginal bleeding, contractions, leaking of fluid and fetal movement were reviewed in detail with the patient.  Please refer to After Visit Summary for other counseling recommendations.   Return in about 4 weeks (around 01/25/2018) for OB visit.  Conan Bowens 12/28/2017 4:25 PM

## 2017-12-30 LAB — URINE CULTURE, OB REFLEX: Organism ID, Bacteria: NO GROWTH

## 2017-12-30 LAB — CULTURE, OB URINE

## 2017-12-31 ENCOUNTER — Encounter: Payer: Self-pay | Admitting: Obstetrics and Gynecology

## 2017-12-31 LAB — CYTOLOGY - PAP
BACTERIAL VAGINITIS: NEGATIVE
CANDIDA VAGINITIS: NEGATIVE
Chlamydia: NEGATIVE
Diagnosis: UNDETERMINED — AB
HPV: NOT DETECTED
Neisseria Gonorrhea: NEGATIVE
TRICH (WINDOWPATH): NEGATIVE

## 2018-01-04 ENCOUNTER — Encounter: Payer: Self-pay | Admitting: Family Medicine

## 2018-01-04 ENCOUNTER — Telehealth: Payer: Self-pay | Admitting: *Deleted

## 2018-01-04 DIAGNOSIS — K080 Exfoliation of teeth due to systemic causes: Secondary | ICD-10-CM | POA: Diagnosis not present

## 2018-01-04 NOTE — Telephone Encounter (Signed)
-----   Message from Conan BowensKelly M Davis, MD sent at 12/31/2017  1:23 PM EST ----- ASCUS pap, negative HPV, age 22, needs repeat pap 3 years

## 2018-01-04 NOTE — Telephone Encounter (Signed)
Called pt and informed her of Pap results and need for pap in 3 years.  Pt voiced understanding.

## 2018-01-06 ENCOUNTER — Other Ambulatory Visit: Payer: Self-pay | Admitting: Obstetrics and Gynecology

## 2018-01-06 DIAGNOSIS — O0990 Supervision of high risk pregnancy, unspecified, unspecified trimester: Secondary | ICD-10-CM

## 2018-01-06 LAB — OBSTETRIC PANEL, INCLUDING HIV
Antibody Screen: NEGATIVE
BASOS ABS: 0 10*3/uL (ref 0.0–0.2)
Basos: 0 %
EOS (ABSOLUTE): 0.1 10*3/uL (ref 0.0–0.4)
EOS: 1 %
HEMOGLOBIN: 8.4 g/dL — AB (ref 11.1–15.9)
HEP B S AG: NEGATIVE
HIV Screen 4th Generation wRfx: NONREACTIVE
Hematocrit: 27.8 % — ABNORMAL LOW (ref 34.0–46.6)
IMMATURE GRANS (ABS): 0 10*3/uL (ref 0.0–0.1)
IMMATURE GRANULOCYTES: 0 %
LYMPHS: 25 %
Lymphocytes Absolute: 1.6 10*3/uL (ref 0.7–3.1)
MCH: 19.4 pg — ABNORMAL LOW (ref 26.6–33.0)
MCHC: 30.2 g/dL — ABNORMAL LOW (ref 31.5–35.7)
MCV: 64 fL — ABNORMAL LOW (ref 79–97)
Monocytes Absolute: 0.7 10*3/uL (ref 0.1–0.9)
Monocytes: 11 %
NEUTROS PCT: 63 %
Neutrophils Absolute: 4.2 10*3/uL (ref 1.4–7.0)
PLATELETS: 324 10*3/uL (ref 150–379)
RBC: 4.32 x10E6/uL (ref 3.77–5.28)
RDW: 22.9 % — ABNORMAL HIGH (ref 12.3–15.4)
RPR: NONREACTIVE
RUBELLA: 5.11 {index} (ref >0.99)
Rh Factor: POSITIVE
WBC: 6.6 10*3/uL (ref 3.4–10.8)

## 2018-01-06 LAB — SMN1 COPY NUMBER ANALYSIS (SMA CARRIER SCREENING)

## 2018-01-06 LAB — HEMOGLOBINOPATHY EVALUATION
FERRITIN: 7 ng/mL — AB (ref 15–150)
HGB A2 QUANT: 4.5 % — AB (ref 1.8–3.2)
HGB SOLUBILITY: NEGATIVE
HGB VARIANT: 0 %
Hgb A: 94.7 % — ABNORMAL LOW (ref 96.4–98.8)
Hgb C: 0 %
Hgb F Quant: 0.8 % (ref 0.0–2.0)
Hgb S: 0 %

## 2018-01-11 ENCOUNTER — Other Ambulatory Visit: Payer: Self-pay | Admitting: General Practice

## 2018-01-11 ENCOUNTER — Ambulatory Visit (HOSPITAL_COMMUNITY)
Admission: RE | Admit: 2018-01-11 | Discharge: 2018-01-11 | Disposition: A | Discharge status: Home / Self Care | Payer: Federal, State, Local not specified - PPO | Source: Ambulatory Visit | Attending: Obstetrics & Gynecology | Admitting: Obstetrics & Gynecology

## 2018-01-11 ENCOUNTER — Encounter (HOSPITAL_COMMUNITY): Payer: Self-pay

## 2018-01-11 DIAGNOSIS — N83202 Unspecified ovarian cyst, left side: Secondary | ICD-10-CM

## 2018-01-11 DIAGNOSIS — Z8669 Personal history of other diseases of the nervous system and sense organs: Secondary | ICD-10-CM

## 2018-01-11 MED ORDER — CYCLOBENZAPRINE HCL 10 MG PO TABS: 10.0000 mg | ORAL_TABLET | Freq: Three times a day (TID) | ORAL | 0 refills | Status: DC | PRN

## 2018-01-12 ENCOUNTER — Telehealth: Payer: Self-pay | Admitting: General Practice

## 2018-01-12 DIAGNOSIS — D563 Thalassemia minor: Secondary | ICD-10-CM

## 2018-01-12 NOTE — Telephone Encounter (Signed)
Per Dr Earlene Plateravis, ASCUS pap, negative HPV, age 22, needs repeat pap 3 years.  Called Hematology/Oncology scheduler, who will speak with a provider to see if patient can be seen there or if patient needs to [facility]. Will follow up.

## 2018-01-12 NOTE — Telephone Encounter (Signed)
-----   Message from Conan BowensKelly M Davis, MD sent at 01/06/2018 12:46 PM EST ----- Anemic with beta thal minor, please refer to Hematology.

## 2018-01-13 ENCOUNTER — Telehealth: Payer: Self-pay | Admitting: Hematology

## 2018-01-13 ENCOUNTER — Encounter: Payer: Self-pay | Admitting: Hematology

## 2018-01-13 NOTE — Telephone Encounter (Signed)
Per chart review, Hematology appt has been scheduled 3/19. Called patient & informed her of all results, recommended follow up, & hematology appt. Patient verbalized understanding to all & had no questions

## 2018-01-13 NOTE — Telephone Encounter (Signed)
Appt has been scheduled for the pt to see Dr. Candise CheKale on 3/19 at 1pm. Pt aware to arrive 30 minutes early. Letter mailed.

## 2018-01-26 ENCOUNTER — Ambulatory Visit (INDEPENDENT_AMBULATORY_CARE_PROVIDER_SITE_OTHER): Payer: Federal, State, Local not specified - PPO | Admitting: Obstetrics and Gynecology

## 2018-01-26 VITALS — BP 131/63 | HR 87 | Wt 152.5 lb

## 2018-01-26 DIAGNOSIS — O34599 Maternal care for other abnormalities of gravid uterus, unspecified trimester: Secondary | ICD-10-CM

## 2018-01-26 DIAGNOSIS — O0990 Supervision of high risk pregnancy, unspecified, unspecified trimester: Secondary | ICD-10-CM

## 2018-01-26 DIAGNOSIS — O0992 Supervision of high risk pregnancy, unspecified, second trimester: Secondary | ICD-10-CM

## 2018-01-26 DIAGNOSIS — O09892 Supervision of other high risk pregnancies, second trimester: Secondary | ICD-10-CM | POA: Diagnosis not present

## 2018-01-26 DIAGNOSIS — O34592 Maternal care for other abnormalities of gravid uterus, second trimester: Secondary | ICD-10-CM

## 2018-01-26 DIAGNOSIS — D563 Thalassemia minor: Secondary | ICD-10-CM | POA: Insufficient documentation

## 2018-01-26 MED ORDER — FERROUS SULFATE 325 (65 FE) MG PO TABS: 325.0000 mg | ORAL_TABLET | Freq: Every day | ORAL | 2 refills | Status: DC

## 2018-01-26 NOTE — Patient Instructions (Signed)

## 2018-01-27 ENCOUNTER — Telehealth: Payer: Self-pay

## 2018-01-27 NOTE — Progress Notes (Signed)
Subjective:  Judy Rodgers is a 22 y.o. G1P0000 at 1658w6d being seen today for ongoing prenatal care.  She is currently monitored for the following issues for this high-risk pregnancy and has Migraine without aura and without status migrainosus, not intractable; Acne; Constipation; Irregular menses; Anemia; Insomnia; Supervision of high risk pregnancy, antepartum; Uterine abnormality during pregnancy, antepartum; and Beta thalassemia minor on their problem list.  Patient reports chronic recurrent headaches.  Contractions: Not present. Vag. Bleeding: None.  Movement: Absent. Denies leaking of fluid.   The following portions of the patient's history were reviewed and updated as appropriate: allergies, current medications, past family history, past medical history, past social history, past surgical history and problem list. Problem list updated.  Objective:   Vitals:   01/26/18 1634  BP: 131/63  Pulse: 87  Weight: 69.2 kg (152 lb 8 oz)    Fetal Status: Fetal Heart Rate (bpm): 161   Movement: Absent     General:  Alert, oriented and cooperative. Patient is in no acute distress.  Skin: Skin is warm and dry. No rash noted.   Cardiovascular: Normal heart rate noted  Respiratory: Normal respiratory effort, no problems with respiration noted  Abdomen: Soft, gravid, appropriate for gestational age. Pain/Pressure: Present     Pelvic: Vag. Bleeding: None Vag D/C Character: White   Cervical exam deferred        Extremities: Normal range of motion.  Edema: None  Mental Status: Normal mood and affect. Normal behavior. Normal judgment and thought content.   Urinalysis:      Assessment and Plan:  Pregnancy: G1P0000 at 4258w6d  1. Supervision of high risk pregnancy, antepartum Doing well. Panorama ordered. Placed order for anatomy US. Continue routine care.  - Genetic Screening - US MFM OB DETAIL +14 WK; Future  2. Uterine abnormality during pregnancy, antepartum Needs further evaluation  postpartum.   3. Beta thalassemia minor Has Hematology appointment scheduled on 02/02/18. Hbg at last check was 7.8. Will start patient on PO iron. Follow-up further hematology recommendations.   General obstetric precautions including but not limited to vaginal bleeding, contractions, leaking of fluid and fetal movement were reviewed in detail with the patient. Please refer to After Visit Summary for other counseling recommendations.  Return in about 4 weeks (around 02/23/2018) for ob visit.   Pincus LargePhelps, Cathyrn Deas Y, DO

## 2018-01-27 NOTE — Telephone Encounter (Signed)
Called pt to provide US appt date & time which is 03/03/18 at 3pm.Pt verbally acknowledged.

## 2018-02-02 ENCOUNTER — Telehealth: Payer: Self-pay | Admitting: Hematology

## 2018-02-02 ENCOUNTER — Inpatient Hospital Stay: Payer: Federal, State, Local not specified - PPO | Attending: Hematology | Admitting: Hematology

## 2018-02-02 ENCOUNTER — Encounter: Payer: Self-pay | Admitting: Hematology

## 2018-02-02 ENCOUNTER — Encounter: Payer: Self-pay | Admitting: *Deleted

## 2018-02-02 ENCOUNTER — Inpatient Hospital Stay: Payer: Federal, State, Local not specified - PPO

## 2018-02-02 VITALS — BP 138/66 | HR 100 | Temp 98.7°F | Resp 20 | Ht 62.5 in | Wt 153.7 lb

## 2018-02-02 DIAGNOSIS — D508 Other iron deficiency anemias: Secondary | ICD-10-CM | POA: Diagnosis not present

## 2018-02-02 DIAGNOSIS — D509 Iron deficiency anemia, unspecified: Secondary | ICD-10-CM | POA: Diagnosis not present

## 2018-02-02 DIAGNOSIS — O99012 Anemia complicating pregnancy, second trimester: Secondary | ICD-10-CM

## 2018-02-02 DIAGNOSIS — Z3A14 14 weeks gestation of pregnancy: Secondary | ICD-10-CM | POA: Insufficient documentation

## 2018-02-02 DIAGNOSIS — R51 Headache: Secondary | ICD-10-CM | POA: Insufficient documentation

## 2018-02-02 DIAGNOSIS — D563 Thalassemia minor: Secondary | ICD-10-CM | POA: Diagnosis not present

## 2018-02-02 DIAGNOSIS — O99013 Anemia complicating pregnancy, third trimester: Secondary | ICD-10-CM

## 2018-02-02 DIAGNOSIS — D500 Iron deficiency anemia secondary to blood loss (chronic): Secondary | ICD-10-CM

## 2018-02-02 DIAGNOSIS — Z79899 Other long term (current) drug therapy: Secondary | ICD-10-CM | POA: Diagnosis not present

## 2018-02-02 HISTORY — DX: Anemia complicating pregnancy, third trimester: O99.013

## 2018-02-02 LAB — CBC WITH DIFFERENTIAL/PLATELET
BASOS ABS: 0 10*3/uL (ref 0.0–0.1)
BASOS PCT: 0 %
EOS ABS: 0.2 10*3/uL (ref 0.0–0.5)
EOS PCT: 3 %
HCT: 27.6 % — ABNORMAL LOW (ref 34.8–46.6)
HEMOGLOBIN: 8.5 g/dL — AB (ref 11.6–15.9)
LYMPHS ABS: 1.8 10*3/uL (ref 0.9–3.3)
Lymphocytes Relative: 24 %
MCH: 20.5 pg — AB (ref 25.1–34.0)
MCHC: 30.8 g/dL — ABNORMAL LOW (ref 31.5–36.0)
MCV: 66.7 fL — ABNORMAL LOW (ref 79.5–101.0)
Monocytes Absolute: 0.7 10*3/uL (ref 0.1–0.9)
Monocytes Relative: 9 %
NEUTROS PCT: 64 %
Neutro Abs: 4.7 10*3/uL (ref 1.5–6.5)
PLATELETS: 243 10*3/uL (ref 145–400)
RBC: 4.14 MIL/uL (ref 3.70–5.45)
RDW: 19.8 % — ABNORMAL HIGH (ref 11.2–14.5)
WBC: 7.4 10*3/uL (ref 3.9–10.3)

## 2018-02-02 LAB — CMP (CANCER CENTER ONLY)
ALK PHOS: 60 U/L (ref 40–150)
AST: 13 U/L (ref 5–34)
Albumin: 3.3 g/dL — ABNORMAL LOW (ref 3.5–5.0)
Anion gap: 9 (ref 3–11)
BUN: 4 mg/dL — AB (ref 7–26)
CALCIUM: 9.8 mg/dL (ref 8.4–10.4)
CO2: 22 mmol/L (ref 22–29)
CREATININE: 0.69 mg/dL (ref 0.60–1.10)
Chloride: 104 mmol/L (ref 98–109)
Glucose, Bld: 79 mg/dL (ref 70–140)
Potassium: 3.8 mmol/L (ref 3.5–5.1)
Sodium: 135 mmol/L — ABNORMAL LOW (ref 136–145)
TOTAL PROTEIN: 7.4 g/dL (ref 6.4–8.3)
Total Bilirubin: <0.2 mg/dL — ABNORMAL LOW (ref 0.2–1.2)

## 2018-02-02 LAB — FERRITIN: FERRITIN: 5 ng/mL — AB (ref 9–269)

## 2018-02-02 LAB — RETICULOCYTES
RBC.: 4.14 MIL/uL (ref 3.70–5.45)
Retic Count, Absolute: 41.4 10*3/uL (ref 33.7–90.7)
Retic Ct Pct: 1 % (ref 0.7–2.1)

## 2018-02-02 LAB — VITAMIN B12: Vitamin B-12: 302 pg/mL (ref 180–914)

## 2018-02-02 LAB — IRON AND TIBC
IRON: 27 ug/dL — AB (ref 41–142)
Saturation Ratios: 7 % — ABNORMAL LOW (ref 21–57)
TIBC: 390 ug/dL (ref 236–444)
UIBC: 363 ug/dL

## 2018-02-02 MED ORDER — POLYSACCHARIDE IRON COMPLEX 150 MG PO CAPS: 150.0000 mg | ORAL_CAPSULE | Freq: Two times a day (BID) | ORAL | 3 refills | Status: DC

## 2018-02-02 NOTE — Patient Instructions (Signed)
Thank you for choosing [facility] to provide your oncology and hematology care.  To afford each patient quality time with our providers, please arrive 30 minutes before your scheduled appointment time.  If you arrive late for your appointment, you may be asked to reschedule.  We strive to give you quality time with our providers, and arriving late affects you and other patients whose appointments are after yours.   If you are a no show for multiple scheduled visits, you may be dismissed from the clinic at the providers discretion.    Again, thank you for choosing [facility], our hope is that these requests will decrease the amount of time that you wait before being seen by our physicians.  ______________________________________________________________________  Should you have questions after your visit to the [facility], please contact our office at [phone] between the hours of 8:30 and 4:30 p.m.    Voicemails left after 4:30p.m will not be returned until the following business day.    For prescription refill requests, please have your pharmacy contact us directly.  Please also try to allow 48 hours for prescription requests.    Please contact the scheduling department for questions regarding scheduling.  For scheduling of procedures such as PET scans, CT scans, MRI, Ultrasound, etc please contact central scheduling at [phone].    Resources For Cancer Patients and Caregivers:   Oncolink.org:  A wonderful resource for patients and healthcare providers for information regarding your disease, ways to tract your treatment, what to expect, etc.     American Cancer Society:  800-227-2345  Can help patients locate various types of support and financial assistance  Cancer Care: 1-800-813-HOPE (4673) Provides financial assistance, online support groups, medication/co-pay assistance.    [place] DSS:  336-641-3447 Where to apply for food  stamps, Medicaid, and utility assistance  Medicare Rights Center: 800-333-4114 Helps people with Medicare understand their rights and benefits, navigate the Medicare system, and secure the quality healthcare they deserve  SCAT: 336-333-6589 [place] [employer]'s shared-ride transportation service for eligible riders who have a disability that prevents them from riding the fixed route bus.    For additional information on assistance programs please contact our social worker:   Grier Hock/Abigail Elmore:  [phone]            

## 2018-02-02 NOTE — Progress Notes (Signed)
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   She sees OB Dr. Vear Clock and Dr. Earlene Plater. She does not have a PCP at this time. She presents to the clinic today 14 weeks and 5 days pregnant. The OB recommended to take OTC ferrous Sulfate. The pt has not been taking it due to poor tolerance and lack of insurance coverage for it. She notes this is her first pregnancy, with no prior miscarriages.  She notes having known about her anemia when she was 22 yo.  She used to take gummies and prenatal vitamins. She is currently taking prenatal vitamins. Last year she started experienced more headaches related to her anemia. She has not needed IV iron or blood transfusions in the past.  Her paternal grandmother had anemia. She notes her OB discussed if her anemia continues her fetal growth will be impacted.   In the past, she had tonsillectomy and wisdom teeth extraction. No significant medical history. She currently takes phenergan for nausea and tylenol for headaches as needed. She notes to having mild seasonal allergies, she had a rash and emesis reaction from prior use of penicillin.  On review of symptoms, pt notes more gum bleeding. No more nose bleeds since she was younger. She had experienced menorrhagia (7-8 pads a day) for 5 days before her pregnancy. She notes being dizzy from her headaches and somewhat off balance. She notes she eats a lot of crushes ice. She notes having a metallic taste in her mouth since she has been pregnant. She notes to having nausea from pregnancy. She notes she has not eaten much due to nausea so she has  not been gaining much pregnancy weight. She denies nail changes or hair loss.    MEDICAL HISTORY:  Past Medical History:  Diagnosis Date  . Acne   . Environmental allergies   . Headache(784.0)    chronic ha takes motrin 800mg  PRN    SURGICAL HISTORY: Past Surgical History:  Procedure Laterality Date  . TONSILLECTOMY    . TONSILLECTOMY    . WISDOM TOOTH EXTRACTION  09/2015    SOCIAL HISTORY: Social History   Socioeconomic History  . Marital status: Single    Spouse name: Not on file  . Number of children: Not on file  . Years of education: Not on file  . Highest education level: Not on file  Social Needs  . Financial resource strain: Not on file  . Food insecurity - worry: Not on file  . Food insecurity - inability: Not on file  . Transportation needs - medical: Not on file  . Transportation needs - non-medical: Not on file  Occupational History  . Not on file  Tobacco Use  . Smoking status: Never Smoker  . Smokeless tobacco: Never Used  Substance and Sexual Activity  . Alcohol use: No  . Drug use: No    Comment: marijuana - maybe in early pregnancy  . Sexual activity: Yes    Partners: Male    Birth control/protection: None  Other Topics Concern  . Not on file  Social History Narrative  . Not on file  FAMILY HISTORY: Family History  Problem Relation Age of Onset  . Heart disease Maternal Grandmother     ALLERGIES:  is allergic to penicillins; amoxicillin-pot clavulanate; and metronidazole.  MEDICATIONS:  Current Outpatient Medications  Medication Sig Dispense Refill  . acetaminophen (TYLENOL) 325 MG tablet Take 650 mg by mouth every 6 (six) hours as needed.    . cyclobenzaprine (FLEXERIL) 10 MG tablet Take 1 tablet (10 mg total) by mouth 3 (three) times daily as needed for muscle spasms. 30 tablet 0  . Prenatal Multivit-Min-Fe-FA (PRENATAL VITAMINS) 0.8 MG tablet Take 1 tablet by mouth daily. 30 tablet 12  . promethazine (PHENERGAN) 25 MG tablet  Take 1 tablet (25 mg total) by mouth every 6 (six) hours as needed for nausea or vomiting. 30 tablet 3  . iron polysaccharides (NIFEREX) 150 MG capsule Take 1 capsule (150 mg total) by mouth 2 (two) times daily. 60 capsule 3   No current facility-administered medications for this visit.     REVIEW OF SYSTEMS:    10 Point review of Systems was done is negative except as noted above.  PHYSICAL EXAMINATION: ECOG PERFORMANCE STATUS: 1 - Symptomatic but completely ambulatory  . Vitals:   02/02/18 1314  BP: 138/66  Pulse: 100  Resp: 20  Temp: 98.7 F (37.1 C)  SpO2: 100%   Filed Weights   02/02/18 1314  Weight: 153 lb 11.2 oz (69.7 kg)   .Body mass index is 27.66 kg/m.  GENERAL:alert, in no acute distress and comfortable SKIN: no acute rashes, no significant lesions EYES: conjunctival pallor+, sclera anicteric OROPHARYNX: MMM, no exudates, no oropharyngeal erythema or ulceration NECK: supple, no JVD LYMPH:  no palpable lymphadenopathy in the cervical, axillary or inguinal regions LUNGS: clear to auscultation b/l with normal respiratory effort HEART: regular rate & rhythm ABDOMEN:  normoactive bowel sounds , non tender, not distended. Extremity: trace pedal edema PSYCH: alert & oriented x 3 with fluent speech NEURO: no focal motor/sensory deficits  LABORATORY DATA:  I have reviewed the data as listed  . CBC Latest Ref Rng & Units 02/02/2018 02/02/2018 12/28/2017  WBC 3.9 - 10.3 K/uL 7.4 - 6.6  Hemoglobin 11.6 - 15.9 g/dL 1.6(X) - 8.4(L)  Hematocrit 34.8 - 46.6 % 27.6(L) 27.3(L) 27.8(L)  Platelets 145 - 400 K/uL 243 - 324  HGB 8.5  . CMP Latest Ref Rng & Units 02/02/2018 10/18/2017 02/28/2017  Glucose 70 - 140 mg/dL 79 95 96  BUN 7 - 26 mg/dL 4(L) 7 7  Creatinine 0.96 - 1.10 mg/dL 0.45 4.09 8.11  Sodium 136 - 145 mmol/L 135(L) 137 135  Potassium 3.5 - 5.1 mmol/L 3.8 3.8 3.6  Chloride 98 - 109 mmol/L 104 107 104  CO2 22 - 29 mmol/L 22 25 25   Calcium 8.4 - 10.4 mg/dL  9.8 9.0 9.1  Total Protein 6.4 - 8.3 g/dL 7.4 - 7.2  Total Bilirubin 0.2 - 1.2 mg/dL <9.1(Y) - 0.4  Alkaline Phos 40 - 150 U/L 60 - 59  AST 5 - 34 U/L 13 - 18  ALT 0 - 55 U/L <6 - 8(L)       . Lab Results  Component Value Date   IRON 27 (L) 02/02/2018   TIBC 390 02/02/2018   IRONPCTSAT 7 (L) 02/02/2018   (Iron and TIBC)  Lab Results  Component Value Date   FERRITIN 5 (L) 02/02/2018   Component     Latest Ref Rng & Units 02/02/2018  Folate, Hemolysate     Not  Estab. ng/mL 456.0  HCT     34.0 - 46.6 % 27.3 (L)  Folate, RBC     >498 ng/mL 1,670  Vitamin B12     180 - 914 pg/mL 302      RADIOGRAPHIC STUDIES: I have personally reviewed the radiological images as listed and agreed with the findings in the report. No results found.  ASSESSMENT & PLAN:  Judy Rodgers is a 22 y.o. African-American female with    1.Severe microcytic Anemia due to Iron deficiency Anemia + Beta Thalassemia minor  Baseline Hg seems to range 10-11, consistent with Beta Thal minor Hgb now worse at 8.5 due to severe iron deficiency  Ferritin 5, iron saturation 7%.  2. Iron deficiency related to heavy menstrual losses prior to pregnancy + increased requirement with pregnancy.  3. Headaches, secondary to anemia  4. Fatigue related to anemia  5. Poor tolerance to po iron in the past.  PLAN:  -we reivewed her available lab results - I discussed her anemia is significant and is related to - iron deficiency I+ her Beta Thalassemia Minor . -Given her intolerance to ferrous sulfate, I recommend iron polysaccharide 150mg  po once a day and if tolerable after first week she can increase to BID. I prescribed today  -she notes low weight of fetus "small baby" - we discussed severe iron deficiency any cause IUGR of fetus. -she is keen to aggressively correct her iron deficiency and prefers to proceed with IV Iron after discussing risks vs benefits. -we discussed advantage of feraheme /Injectafer vs  venofer. -she prefers to proceed with Venofer since it is cat B medication in pregnancy. -She opted to proceed with direct correction soon by category B with IV Venofer for 3-4 infusions. Will set up to start next week.  -Continue prenatal vitamins -B12 somewhat low - would add B12 500 mcg po daiyl in addition to prenatal vitamins. -Will do baseline labs today    Prescribed iron polysaccharide today  Labs today IV Venofer weekly x 4 doses (Patient is at 14 weeks pregnancy) RTC with Dr Candise CheKale in 6 weeks with labs   All of the patients questions were answered with apparent satisfaction. The patient knows to call the clinic with any problems, questions or concerns.  I spent 45 minutes counseling the patient face to face. The total time spent in the appointment was 60 minutes and more than 50% was on counseling and direct patient cares.    Wyvonnia LoraGautam Tytus Strahle MD MS AAHIVMS [facility] Hematology/Oncology [facility]ician [facility]  (Office):       [phone] (Work cell):  [phone] (Fax):           [phone]  02/02/2018 1:53 PM  This document serves as a record of services personally performed by Wyvonnia LoraGautam Saahir Prude, MD. It was created on his behalf by Delphina CahillAmoya Bennett, a trained medical scribe. The creation of this record is based on the scribe's personal observations and the provider's statements to them.    .I have reviewed the above documentation for accuracy and completeness, and I agree with the above. Johney Maine.Kenzo Ozment Kishore Shaya Reddick MD MS

## 2018-02-02 NOTE — Telephone Encounter (Signed)
Appointments scheduled AVS/Calendar printed per 3/19 los °

## 2018-02-04 LAB — FOLATE RBC
FOLATE, HEMOLYSATE: 456 ng/mL
FOLATE, RBC: 1670 ng/mL (ref >498)
HEMATOCRIT: 27.3 % — AB (ref 34.0–46.6)

## 2018-02-05 ENCOUNTER — Encounter: Payer: Self-pay | Admitting: *Deleted

## 2018-02-05 ENCOUNTER — Encounter: Payer: Self-pay | Admitting: Obstetrics and Gynecology

## 2018-02-08 ENCOUNTER — Telehealth: Payer: Self-pay

## 2018-02-08 ENCOUNTER — Encounter: Payer: Self-pay | Admitting: Obstetrics and Gynecology

## 2018-02-08 NOTE — Telephone Encounter (Signed)
Loren, RN noticed this patient who is [redacted] weeks pregnant on her schedule tomorrow and asked Dr. Candise CheKale about fetal monitoring.  [facility] that he wanted doppler fetal tones pre and post the infusion. We do not have the competency or the training to do Doppler [facility].  Mrs. Chestine SporeClark [facility].  She was very upset as she is symptomatic and is taking Tylenol around the clock to help with headaches.  She also has fatigue, weakness, and dizziness and really wanted to get her infusion tomorrow.  She was unable to fill her PO iron prescription because she does not have the money to fill the prescription for her PO iron.  He Ferritin is 5 and Hgb is 8.5 from her last labs.  She is leaving to go out of town on Wednesday and will not be coming back until 02/16/2018 which is another reason why she is upset about her infusion being cancelled tomorrow.  Her husband also took off work tomorrow to be with her during her infusion.  She has several upcoming appointments with us for IV iron that [facility] unless we get approval [facility] future.   Hopefully, we will [facility] and Dr. Doroteo GlassmanPhelps has been notified via staff message.  Lorayne MarekMelanie M Astraea Gaughran

## 2018-02-09 ENCOUNTER — Ambulatory Visit: Payer: Federal, State, Local not specified - PPO

## 2018-02-09 NOTE — Telephone Encounter (Signed)
I have spoken with [facility] here at [facility].  There is a rapid response nurse that should be able to come over to obtain fetal heart tones for you.  She can be reached at [phone].  Additionally, the patient may be able to have her infusion here at [facility].  This would need to be discussed with the Maternity Assessment Unit staff.  There direct number is [phone].

## 2018-02-10 ENCOUNTER — Encounter: Payer: Self-pay | Admitting: Hematology

## 2018-02-10 NOTE — Telephone Encounter (Signed)
Lelon MastSamantha and Minerva EndsLoren, Do you want to call this patient back to get her rescheduled ASAP and we will arrange the OBGYN Nurse to come over. Lorayne MarekMelanie M Rodgers

## 2018-02-11 ENCOUNTER — Telehealth: Payer: Self-pay

## 2018-02-11 ENCOUNTER — Other Ambulatory Visit: Payer: Self-pay

## 2018-02-11 ENCOUNTER — Other Ambulatory Visit: Payer: Self-pay | Admitting: Hematology

## 2018-02-11 DIAGNOSIS — O99012 Anemia complicating pregnancy, second trimester: Secondary | ICD-10-CM

## 2018-02-11 DIAGNOSIS — D500 Iron deficiency anemia secondary to blood loss (chronic): Secondary | ICD-10-CM

## 2018-02-11 NOTE — Telephone Encounter (Signed)
Pt scheduled for venofer infusion at [facility] by Clide CliffLaverne. Appointment will be 02/15/18 at 1400. Pt notified of appointment time and location. Orders placed in sign and held for venofer weekly x 4. Pt verbalized understanding of appointment time and location.

## 2018-02-15 ENCOUNTER — Ambulatory Visit (HOSPITAL_COMMUNITY)
Admission: RE | Admit: 2018-02-15 | Discharge: 2018-02-15 | Disposition: A | Discharge status: Home / Self Care | Payer: Federal, State, Local not specified - PPO | Source: Ambulatory Visit | Attending: Hematology | Admitting: Hematology

## 2018-02-15 DIAGNOSIS — Z3A00 Weeks of gestation of pregnancy not specified: Secondary | ICD-10-CM | POA: Diagnosis not present

## 2018-02-15 DIAGNOSIS — D500 Iron deficiency anemia secondary to blood loss (chronic): Secondary | ICD-10-CM | POA: Diagnosis not present

## 2018-02-15 DIAGNOSIS — O99012 Anemia complicating pregnancy, second trimester: Secondary | ICD-10-CM | POA: Diagnosis not present

## 2018-02-15 MED ORDER — SODIUM CHLORIDE 0.9 % IV SOLN
200.0000 mg | INTRAVENOUS | Status: DC
  Administered 2018-02-15: 200 mg via INTRAVENOUS
  Filled 2018-02-15: qty 10

## 2018-02-15 NOTE — Discharge Instructions (Signed)
Iron Sucrose injection What is this medicine? IRON SUCROSE (AHY ern SOO krohs) is an iron complex. Iron is used to make healthy red blood cells, which carry oxygen and nutrients throughout the body. This medicine is used to treat iron deficiency anemia in people with chronic kidney disease. This medicine may be used for other purposes; ask your health care provider or pharmacist if you have questions. COMMON BRAND NAME(S): Venofer What should I tell my health care provider before I take this medicine? They need to know if you have any of these conditions: -anemia not caused by low iron levels -heart disease -high levels of iron in the blood -kidney disease -liver disease -an unusual or allergic reaction to iron, other medicines, foods, dyes, or preservatives -pregnant or trying to get pregnant -breast-feeding How should I use this medicine? This medicine is for infusion into a vein. It is given by a health care professional in a hospital or clinic setting. Talk to your pediatrician regarding the use of this medicine in children. While this drug may be prescribed for children as young as 2 years for selected conditions, precautions do apply. Overdosage: If you think you have taken too much of this medicine contact a poison control center or emergency room at once. NOTE: This medicine is only for you. Do not share this medicine with others. What if I miss a dose? It is important not to miss your dose. Call your doctor or health care professional if you are unable to keep an appointment. What may interact with this medicine? Do not take this medicine with any of the following medications: -deferoxamine -dimercaprol -other iron products This medicine may also interact with the following medications: -chloramphenicol -deferasirox This list may not describe all possible interactions. Give your health care provider a list of all the medicines, herbs, non-prescription drugs, or dietary  supplements you use. Also tell them if you smoke, drink alcohol, or use illegal drugs. Some items may interact with your medicine. What should I watch for while using this medicine? Visit your doctor or healthcare professional regularly. Tell your doctor or healthcare professional if your symptoms do not start to get better or if they get worse. You may need blood work done while you are taking this medicine. You may need to follow a special diet. Talk to your doctor. Foods that contain iron include: whole grains/cereals, dried fruits, beans, or peas, leafy green vegetables, and organ meats (liver, kidney). What side effects may I notice from receiving this medicine? Side effects that you should report to your doctor or health care professional as soon as possible: -allergic reactions like skin rash, itching or hives, swelling of the face, lips, or tongue -breathing problems -changes in blood pressure -cough -fast, irregular heartbeat -feeling faint or lightheaded, falls -fever or chills -flushing, sweating, or hot feelings -joint or muscle aches/pains -seizures -swelling of the ankles or feet -unusually weak or tired Side effects that usually do not require medical attention (report to your doctor or health care professional if they continue or are bothersome): -diarrhea -feeling achy -headache -irritation at site where injected -nausea, vomiting -stomach upset -tiredness This list may not describe all possible side effects. Call your doctor for medical advice about side effects. You may report side effects to FDA at 1-800-FDA-1088. Where should I keep my medicine? This drug is given in a hospital or clinic and will not be stored at home. NOTE: This sheet is a summary. It may not cover all possible information. If   you have questions about this medicine, talk to your doctor, pharmacist, or health care provider.  2018 Elsevier/Gold Standard (2011-08-14 17:14:35)  

## 2018-02-16 ENCOUNTER — Ambulatory Visit (INDEPENDENT_AMBULATORY_CARE_PROVIDER_SITE_OTHER): Payer: Federal, State, Local not specified - PPO | Admitting: Obstetrics and Gynecology

## 2018-02-16 VITALS — BP 122/66 | HR 72 | Wt 153.9 lb

## 2018-02-16 DIAGNOSIS — O0990 Supervision of high risk pregnancy, unspecified, unspecified trimester: Secondary | ICD-10-CM

## 2018-02-16 DIAGNOSIS — O99012 Anemia complicating pregnancy, second trimester: Secondary | ICD-10-CM

## 2018-02-16 DIAGNOSIS — O34599 Maternal care for other abnormalities of gravid uterus, unspecified trimester: Secondary | ICD-10-CM

## 2018-02-16 DIAGNOSIS — D563 Thalassemia minor: Secondary | ICD-10-CM

## 2018-02-16 NOTE — Patient Instructions (Signed)
Second Trimester of Pregnancy The second trimester is from week 13 through week 28, month 4 through 6. This is often the time in pregnancy that you feel your best. Often times, morning sickness has lessened or quit. You may have more energy, and you may get hungry more often. Your unborn baby (fetus) is growing rapidly. At the end of the sixth month, he or she is about 9 inches long and weighs about 1 pounds. You will likely feel the baby move (quickening) between 18 and 20 weeks of pregnancy. Follow these instructions at home:  Avoid all smoking, herbs, and alcohol. Avoid drugs not approved by your doctor.  Do not use any tobacco products, including cigarettes, chewing tobacco, and electronic cigarettes. If you need help quitting, ask your doctor. You may get counseling or other support to help you quit.  Only take medicine as told by your doctor. Some medicines are safe and some are not during pregnancy.  Exercise only as told by your doctor. Stop exercising if you start having cramps.  Eat regular, healthy meals.  Wear a good support bra if your breasts are tender.  Do not use hot tubs, steam rooms, or saunas.  Wear your seat belt when driving.  Avoid raw meat, uncooked cheese, and liter boxes and soil used by cats.  Take your prenatal vitamins.  Take 1500-2000 milligrams of calcium daily starting at the 20th week of pregnancy until you deliver your baby.  Try taking medicine that helps you poop (stool softener) as needed, and if your doctor approves. Eat more fiber by eating fresh fruit, vegetables, and whole grains. Drink enough fluids to keep your pee (urine) clear or pale yellow.  Take warm water baths (sitz baths) to soothe pain or discomfort caused by hemorrhoids. Use hemorrhoid cream if your doctor approves.  If you have puffy, bulging veins (varicose veins), wear support hose. Raise (elevate) your feet for 15 minutes, 3-4 times a day. Limit salt in your diet.  Avoid heavy  lifting, wear low heals, and sit up straight.  Rest with your legs raised if you have leg cramps or low back pain.  Visit your dentist if you have not gone during your pregnancy. Use a soft toothbrush to brush your teeth. Be gentle when you floss.  You can have sex (intercourse) unless your doctor tells you not to.  Go to your doctor visits. Get help if:  You feel dizzy.  You have mild cramps or pressure in your lower belly (abdomen).  You have a nagging pain in your belly area.  You continue to feel sick to your stomach (nauseous), throw up (vomit), or have watery poop (diarrhea).  You have bad smelling fluid coming from your vagina.  You have pain with peeing (urination). Get help right away if:  You have a fever.  You are leaking fluid from your vagina.  You have spotting or bleeding from your vagina.  You have severe belly cramping or pain.  You lose or gain weight rapidly.  You have trouble catching your breath and have chest pain.  You notice sudden or extreme puffiness (swelling) of your face, hands, ankles, feet, or legs.  You have not felt the baby move in over an hour.  You have severe headaches that do not go away with medicine.  You have vision changes. This information is not intended to replace advice given to you by your health care provider. Make sure you discuss any questions you have with your health care   provider. Document Released: 01/28/2010 Document Revised: 04/10/2016 Document Reviewed: 01/04/2013 Elsevier Interactive Patient Education  2017 Elsevier Inc.  

## 2018-02-17 NOTE — Progress Notes (Signed)
Subjective:  Judy Rodgers is a 22 y.o. G1P0000 at 6939w6d being seen today for ongoing prenatal care.  She is currently monitored for the following issues for this high-risk pregnancy and has Migraine without aura and without status migrainosus, not intractable; Acne; Constipation; Irregular menses; Anemia; Insomnia; Supervision of high risk pregnancy, antepartum; Uterine abnormality during pregnancy, antepartum; Beta thalassemia minor; Iron deficiency anemia due to chronic blood loss; and Anemia of mother in pregnancy, antepartum, second trimester on their problem list.  Patient reports no complaints.  Contractions: Not present. Vag. Bleeding: None.  Movement: Absent. Denies leaking of fluid.   The following portions of the patient's history were reviewed and updated as appropriate: allergies, current medications, past family history, past medical history, past social history, past surgical history and problem list. Problem list updated.  Objective:   Vitals:   02/16/18 1626  BP: 122/66  Pulse: 72  Weight: 153 lb 14.4 oz (69.8 kg)    Fetal Status:     Movement: Absent     General:  Alert, oriented and cooperative. Patient is in no acute distress.  Skin: Skin is warm and dry. No rash noted.   Cardiovascular: Normal heart rate noted  Respiratory: Normal respiratory effort, no problems with respiration noted  Abdomen: Soft, gravid, appropriate for gestational age. Pain/Pressure: Present     Pelvic: Vag. Bleeding: None Vag D/C Character: White   Cervical exam deferred        Extremities: Normal range of motion.  Edema: None  Mental Status: Normal mood and affect. Normal behavior. Normal judgment and thought content.   Urinalysis:      Assessment and Plan:  Pregnancy: G1P0000 at 9139w6d  1. Supervision of high risk pregnancy, antepartum Doing well. Anatomy US scheduled.   2. Anemia of mother in pregnancy, antepartum, second trimester S/p hematology visit. Going to received ~3 iron  transfusions. Symptoms have improved per patient.  3. Beta thalassemia minor Follows with hematology. Contributing to anemia.   4. Uterine abnormality during pregnancy, antepartum F/u postpartum  General obstetric precautions including but not limited to vaginal bleeding, contractions, leaking of fluid and fetal movement were reviewed in detail with the patient. Please refer to After Visit Summary for other counseling recommendations.  Return in about 1 month (around 03/16/2018) for ob visit.   Pincus LargePhelps, Jazma Y, DO

## 2018-02-19 ENCOUNTER — Other Ambulatory Visit (HOSPITAL_COMMUNITY): Payer: Self-pay | Admitting: *Deleted

## 2018-02-19 ENCOUNTER — Encounter: Payer: Self-pay | Admitting: Obstetrics and Gynecology

## 2018-02-22 ENCOUNTER — Ambulatory Visit (HOSPITAL_COMMUNITY)
Admission: RE | Admit: 2018-02-22 | Discharge: 2018-02-22 | Disposition: A | Discharge status: Home / Self Care | Payer: Federal, State, Local not specified - PPO | Source: Ambulatory Visit | Attending: Hematology | Admitting: Hematology

## 2018-02-22 DIAGNOSIS — O99012 Anemia complicating pregnancy, second trimester: Secondary | ICD-10-CM | POA: Insufficient documentation

## 2018-02-22 DIAGNOSIS — D500 Iron deficiency anemia secondary to blood loss (chronic): Secondary | ICD-10-CM | POA: Insufficient documentation

## 2018-02-22 MED ORDER — SODIUM CHLORIDE 0.9 % IV SOLN
200.0000 mg | INTRAVENOUS | Status: DC
  Administered 2018-02-22: 200 mg via INTRAVENOUS
  Filled 2018-02-22: qty 10

## 2018-02-23 ENCOUNTER — Ambulatory Visit: Payer: Federal, State, Local not specified - PPO

## 2018-02-24 ENCOUNTER — Encounter: Payer: Self-pay | Admitting: Hematology

## 2018-03-01 ENCOUNTER — Encounter (HOSPITAL_COMMUNITY): Payer: Federal, State, Local not specified - PPO

## 2018-03-02 ENCOUNTER — Ambulatory Visit: Payer: Federal, State, Local not specified - PPO

## 2018-03-03 ENCOUNTER — Ambulatory Visit (HOSPITAL_COMMUNITY): Admission: RE | Admit: 2018-03-03 | Payer: Federal, State, Local not specified - PPO | Source: Ambulatory Visit

## 2018-03-03 ENCOUNTER — Ambulatory Visit (HOSPITAL_COMMUNITY)
Admission: RE | Admit: 2018-03-03 | Discharge: 2018-03-03 | Disposition: A | Discharge status: Home / Self Care | Payer: Federal, State, Local not specified - PPO | Source: Ambulatory Visit | Attending: Obstetrics and Gynecology | Admitting: Obstetrics and Gynecology

## 2018-03-03 ENCOUNTER — Other Ambulatory Visit: Payer: Self-pay | Admitting: Obstetrics and Gynecology

## 2018-03-03 ENCOUNTER — Ambulatory Visit (HOSPITAL_COMMUNITY): Payer: Federal, State, Local not specified - PPO

## 2018-03-03 ENCOUNTER — Encounter (HOSPITAL_COMMUNITY): Payer: Self-pay

## 2018-03-03 ENCOUNTER — Other Ambulatory Visit (HOSPITAL_COMMUNITY): Payer: Self-pay | Admitting: *Deleted

## 2018-03-03 DIAGNOSIS — Z3A18 18 weeks gestation of pregnancy: Secondary | ICD-10-CM | POA: Diagnosis not present

## 2018-03-03 DIAGNOSIS — O3402 Maternal care for unspecified congenital malformation of uterus, second trimester: Secondary | ICD-10-CM | POA: Insufficient documentation

## 2018-03-03 DIAGNOSIS — Q513 Bicornate uterus: Secondary | ICD-10-CM | POA: Insufficient documentation

## 2018-03-03 DIAGNOSIS — O34592 Maternal care for other abnormalities of gravid uterus, second trimester: Secondary | ICD-10-CM | POA: Insufficient documentation

## 2018-03-03 DIAGNOSIS — Z3689 Encounter for other specified antenatal screening: Secondary | ICD-10-CM | POA: Insufficient documentation

## 2018-03-03 DIAGNOSIS — O2692 Pregnancy related conditions, unspecified, second trimester: Secondary | ICD-10-CM | POA: Diagnosis not present

## 2018-03-03 DIAGNOSIS — D569 Thalassemia, unspecified: Secondary | ICD-10-CM | POA: Diagnosis not present

## 2018-03-03 DIAGNOSIS — O0990 Supervision of high risk pregnancy, unspecified, unspecified trimester: Secondary | ICD-10-CM

## 2018-03-03 DIAGNOSIS — Z363 Encounter for antenatal screening for malformations: Secondary | ICD-10-CM | POA: Insufficient documentation

## 2018-03-03 DIAGNOSIS — O34599 Maternal care for other abnormalities of gravid uterus, unspecified trimester: Secondary | ICD-10-CM

## 2018-03-03 DIAGNOSIS — Z362 Encounter for other antenatal screening follow-up: Secondary | ICD-10-CM

## 2018-03-03 DIAGNOSIS — O99012 Anemia complicating pregnancy, second trimester: Secondary | ICD-10-CM | POA: Insufficient documentation

## 2018-03-03 HISTORY — DX: Bicornate uterus: Q51.3

## 2018-03-05 ENCOUNTER — Encounter (HOSPITAL_COMMUNITY)
Admission: RE | Admit: 2018-03-05 | Discharge: 2018-03-05 | Disposition: A | Discharge status: Home / Self Care | Payer: Federal, State, Local not specified - PPO | Source: Ambulatory Visit | Attending: Hematology | Admitting: Hematology

## 2018-03-05 DIAGNOSIS — Z3A18 18 weeks gestation of pregnancy: Secondary | ICD-10-CM | POA: Insufficient documentation

## 2018-03-05 DIAGNOSIS — O99012 Anemia complicating pregnancy, second trimester: Secondary | ICD-10-CM | POA: Insufficient documentation

## 2018-03-05 DIAGNOSIS — D500 Iron deficiency anemia secondary to blood loss (chronic): Secondary | ICD-10-CM

## 2018-03-05 MED ORDER — IRON SUCROSE 20 MG/ML IV SOLN
200.0000 mg | INTRAVENOUS | Status: DC
  Administered 2018-03-05: 200 mg via INTRAVENOUS
  Filled 2018-03-05: qty 10

## 2018-03-08 ENCOUNTER — Other Ambulatory Visit: Payer: Self-pay | Admitting: Obstetrics and Gynecology

## 2018-03-08 DIAGNOSIS — D569 Thalassemia, unspecified: Secondary | ICD-10-CM

## 2018-03-08 DIAGNOSIS — O99012 Anemia complicating pregnancy, second trimester: Secondary | ICD-10-CM

## 2018-03-08 DIAGNOSIS — Z3689 Encounter for other specified antenatal screening: Secondary | ICD-10-CM

## 2018-03-08 DIAGNOSIS — Q513 Bicornate uterus: Secondary | ICD-10-CM

## 2018-03-08 DIAGNOSIS — O3402 Maternal care for unspecified congenital malformation of uterus, second trimester: Secondary | ICD-10-CM

## 2018-03-08 DIAGNOSIS — Z3A18 18 weeks gestation of pregnancy: Secondary | ICD-10-CM

## 2018-03-09 ENCOUNTER — Ambulatory Visit: Payer: Federal, State, Local not specified - PPO

## 2018-03-10 ENCOUNTER — Other Ambulatory Visit (HOSPITAL_COMMUNITY): Payer: Self-pay | Admitting: *Deleted

## 2018-03-11 ENCOUNTER — Ambulatory Visit (HOSPITAL_COMMUNITY)
Admission: RE | Admit: 2018-03-11 | Discharge: 2018-03-11 | Disposition: A | Discharge status: Home / Self Care | Payer: Federal, State, Local not specified - PPO | Source: Ambulatory Visit | Attending: Hematology | Admitting: Hematology

## 2018-03-11 DIAGNOSIS — D500 Iron deficiency anemia secondary to blood loss (chronic): Secondary | ICD-10-CM | POA: Insufficient documentation

## 2018-03-11 DIAGNOSIS — Z3A00 Weeks of gestation of pregnancy not specified: Secondary | ICD-10-CM | POA: Diagnosis not present

## 2018-03-11 DIAGNOSIS — O99012 Anemia complicating pregnancy, second trimester: Secondary | ICD-10-CM | POA: Insufficient documentation

## 2018-03-11 MED ORDER — SODIUM CHLORIDE 0.9 % IV SOLN
200.0000 mg | INTRAVENOUS | Status: DC
  Filled 2018-03-11: qty 10

## 2018-03-11 MED ORDER — SODIUM CHLORIDE 0.9 % IV SOLN
200.0000 mg | INTRAVENOUS | Status: DC
  Administered 2018-03-11: 200 mg via INTRAVENOUS
  Filled 2018-03-11: qty 10

## 2018-03-15 ENCOUNTER — Encounter (HOSPITAL_COMMUNITY): Payer: Federal, State, Local not specified - PPO

## 2018-03-15 ENCOUNTER — Encounter: Payer: Self-pay | Admitting: Obstetrics and Gynecology

## 2018-03-15 ENCOUNTER — Ambulatory Visit (INDEPENDENT_AMBULATORY_CARE_PROVIDER_SITE_OTHER): Payer: Federal, State, Local not specified - PPO | Admitting: Obstetrics and Gynecology

## 2018-03-15 VITALS — BP 125/59 | HR 53 | Wt 153.5 lb

## 2018-03-15 DIAGNOSIS — K068 Other specified disorders of gingiva and edentulous alveolar ridge: Secondary | ICD-10-CM

## 2018-03-15 DIAGNOSIS — D563 Thalassemia minor: Secondary | ICD-10-CM

## 2018-03-15 DIAGNOSIS — O0990 Supervision of high risk pregnancy, unspecified, unspecified trimester: Secondary | ICD-10-CM

## 2018-03-15 DIAGNOSIS — O34599 Maternal care for other abnormalities of gravid uterus, unspecified trimester: Secondary | ICD-10-CM

## 2018-03-15 NOTE — Progress Notes (Signed)
   PRENATAL VISIT NOTE  Subjective:  Judy Rodgers is a 22 y.o. G1P0000 at [redacted]w[redacted]d being seen today for ongoing prenatal care.  She is currently monitored for the following issues for this high-risk pregnancy and has Migraine without aura and without status migrainosus, not intractable; Acne; Constipation; Irregular menses; Anemia; Insomnia; Supervision of high risk pregnancy, antepartum; Uterine abnormality during pregnancy, antepartum; Beta thalassemia minor; Iron deficiency anemia due to chronic blood loss; and Anemia of mother in pregnancy, antepartum, second trimester on their problem list.  Patient reports minor cramping.  Contractions: Not present. Vag. Bleeding: None.  Movement: Present. Denies leaking of fluid. Reports significantly swollen gums that bleed with flossing.   The following portions of the patient's history were reviewed and updated as appropriate: allergies, current medications, past family history, past medical history, past social history, past surgical history and problem list. Problem list updated.  Objective:   Vitals:   03/15/18 1643  BP: (!) 125/59  Pulse: (!) 53  Weight: 153 lb 8 oz (69.6 kg)    Fetal Status: Fetal Heart Rate (bpm): 155   Movement: Present     General:  Alert, oriented and cooperative. Patient is in no acute distress.  Skin: Skin is warm and dry. No rash noted.   Cardiovascular: Normal heart rate noted  Respiratory: Normal respiratory effort, no problems with respiration noted  Abdomen: Soft, gravid, appropriate for gestational age.  Pain/Pressure: Absent     Pelvic: Cervical exam deferred        Extremities: Normal range of motion.  Edema: None  Mental Status: Normal mood and affect. Normal behavior. Normal judgment and thought content.   Assessment and Plan:  Pregnancy: G1P0000 at [redacted]w[redacted]d  1. Uterine abnormality during pregnancy, antepartum No issues  2. Supervision of high risk pregnancy, antepartum Anatomy normal  3. Beta  thalassemia minor S/p IV iron transfusion x4 F/u with Heme tomorrow  4. Bleeding gums Encouraged flossing/brushing She will call with name of mouthwash prescribed  Preterm labor symptoms and general obstetric precautions including but not limited to vaginal bleeding, contractions, leaking of fluid and fetal movement were reviewed in detail with the patient. Please refer to After Visit Summary for other counseling recommendations.  Return in about 3 weeks (around 04/05/2018) for OB visit (MD).  Future Appointments  Date Time Provider Department Center  03/16/2018  9:30 AM [facility] 3 [facility] None  03/16/2018 10:00 AM Johney Maine, MD [facility] None  03/31/2018  3:00 PM [facility] [place] 3 [facility]    Conan Bowens, MD

## 2018-03-15 NOTE — Progress Notes (Signed)
Having problem with swollen gums, has braces and states regular brushing and flossing Concerned about acne, wants to know what she can use for it.

## 2018-03-16 ENCOUNTER — Ambulatory Visit: Payer: Federal, State, Local not specified - PPO | Admitting: Hematology

## 2018-03-16 ENCOUNTER — Other Ambulatory Visit: Payer: Federal, State, Local not specified - PPO

## 2018-03-31 ENCOUNTER — Ambulatory Visit (HOSPITAL_COMMUNITY): Payer: Federal, State, Local not specified - PPO

## 2018-04-07 ENCOUNTER — Ambulatory Visit (HOSPITAL_COMMUNITY)
Admission: RE | Admit: 2018-04-07 | Discharge: 2018-04-07 | Disposition: A | Discharge status: Home / Self Care | Payer: Federal, State, Local not specified - PPO | Source: Ambulatory Visit | Attending: Obstetrics and Gynecology | Admitting: Obstetrics and Gynecology

## 2018-04-07 ENCOUNTER — Other Ambulatory Visit (HOSPITAL_COMMUNITY): Payer: Self-pay | Admitting: Obstetrics and Gynecology

## 2018-04-07 ENCOUNTER — Encounter (HOSPITAL_COMMUNITY): Payer: Self-pay

## 2018-04-07 DIAGNOSIS — O2692 Pregnancy related conditions, unspecified, second trimester: Secondary | ICD-10-CM

## 2018-04-07 DIAGNOSIS — O34592 Maternal care for other abnormalities of gravid uterus, second trimester: Secondary | ICD-10-CM | POA: Diagnosis not present

## 2018-04-07 DIAGNOSIS — O99012 Anemia complicating pregnancy, second trimester: Secondary | ICD-10-CM | POA: Insufficient documentation

## 2018-04-07 DIAGNOSIS — Z362 Encounter for other antenatal screening follow-up: Secondary | ICD-10-CM

## 2018-04-07 DIAGNOSIS — Q513 Bicornate uterus: Secondary | ICD-10-CM | POA: Insufficient documentation

## 2018-04-07 DIAGNOSIS — Z3A23 23 weeks gestation of pregnancy: Secondary | ICD-10-CM | POA: Insufficient documentation

## 2018-04-07 DIAGNOSIS — O0990 Supervision of high risk pregnancy, unspecified, unspecified trimester: Secondary | ICD-10-CM

## 2018-04-07 DIAGNOSIS — O3402 Maternal care for unspecified congenital malformation of uterus, second trimester: Secondary | ICD-10-CM | POA: Diagnosis not present

## 2018-04-07 DIAGNOSIS — IMO0002 Reserved for concepts with insufficient information to code with codable children: Secondary | ICD-10-CM

## 2018-04-07 DIAGNOSIS — O34599 Maternal care for other abnormalities of gravid uterus, unspecified trimester: Secondary | ICD-10-CM

## 2018-04-07 DIAGNOSIS — Z0489 Encounter for examination and observation for other specified reasons: Secondary | ICD-10-CM

## 2018-04-08 ENCOUNTER — Other Ambulatory Visit (HOSPITAL_COMMUNITY): Payer: Self-pay | Admitting: *Deleted

## 2018-04-08 DIAGNOSIS — Z362 Encounter for other antenatal screening follow-up: Secondary | ICD-10-CM

## 2018-04-09 ENCOUNTER — Ambulatory Visit (INDEPENDENT_AMBULATORY_CARE_PROVIDER_SITE_OTHER): Payer: Federal, State, Local not specified - PPO | Admitting: Obstetrics and Gynecology

## 2018-04-09 VITALS — BP 119/71 | HR 78 | Wt 170.5 lb

## 2018-04-09 DIAGNOSIS — Z72820 Sleep deprivation: Secondary | ICD-10-CM

## 2018-04-09 DIAGNOSIS — D563 Thalassemia minor: Secondary | ICD-10-CM

## 2018-04-09 DIAGNOSIS — O99012 Anemia complicating pregnancy, second trimester: Secondary | ICD-10-CM

## 2018-04-09 DIAGNOSIS — O0990 Supervision of high risk pregnancy, unspecified, unspecified trimester: Secondary | ICD-10-CM

## 2018-04-09 MED ORDER — ZOLPIDEM TARTRATE 5 MG PO TABS: 5.0000 mg | ORAL_TABLET | Freq: Every evening | ORAL | 0 refills | Status: DC | PRN

## 2018-04-09 NOTE — Progress Notes (Signed)
Subjective:  Judy Rodgers is a 22 y.o. G1P0000 at [redacted]w[redacted]d being seen today for ongoing prenatal care.  She is currently monitored for the following issues for this high-risk pregnancy and has Migraine without aura and without status migrainosus, not intractable; Acne; Constipation; Irregular menses; Anemia; Insomnia; Supervision of high risk pregnancy, antepartum; Uterine abnormality during pregnancy, antepartum; Beta thalassemia minor; Iron deficiency anemia due to chronic blood loss; and Anemia of mother in pregnancy, antepartum, second trimester on their problem list.  Patient reports no complaints.  Contractions: Not present. Vag. Bleeding: None.  Movement: Present. Denies leaking of fluid.   The following portions of the patient's history were reviewed and updated as appropriate: allergies, current medications, past family history, past medical history, past social history, past surgical history and problem list. Problem list updated.  Objective:   Vitals:   04/09/18 1638  BP: 119/71  Pulse: 78  Weight: 170 lb 8 oz (77.3 kg)    Fetal Status: Fetal Heart Rate (bpm): 150 Fundal Height: 24 cm Movement: Present     General:  Alert, oriented and cooperative. Patient is in no acute distress.  Skin: Skin is warm and dry. No rash noted.   Cardiovascular: Normal heart rate noted  Respiratory: Normal respiratory effort, no problems with respiration noted  Abdomen: Soft, gravid, appropriate for gestational age. Pain/Pressure: Absent     Pelvic: Vag. Bleeding: None     Cervical exam deferred        Extremities: Normal range of motion.  Edema: None  Mental Status: Normal mood and affect. Normal behavior. Normal judgment and thought content.   Urinalysis:      Assessment and Plan:  Pregnancy: G1P0000 at [redacted]w[redacted]d  1. Supervision of high risk pregnancy, antepartum Doing well. Continue routine care. 28wk labs and Tdap next visit.   2. Anemia of mother in pregnancy, antepartum, second  trimester S/p 4 IV iron transfusions. Feeling much improved. Following with hematology.   3. Beta thalassemia minor  4. Poor sleep Rx Ambien prn limited amount  Preterm labor symptoms and general obstetric precautions including but not limited to vaginal bleeding, contractions, leaking of fluid and fetal movement were reviewed in detail with the patient. Please refer to After Visit Summary for other counseling recommendations.  Return in about 1 month (around 05/07/2018) for ob visit.   Pincus Large, DO

## 2018-05-03 ENCOUNTER — Other Ambulatory Visit: Payer: Self-pay

## 2018-05-03 DIAGNOSIS — O0990 Supervision of high risk pregnancy, unspecified, unspecified trimester: Secondary | ICD-10-CM

## 2018-05-04 ENCOUNTER — Other Ambulatory Visit: Payer: Federal, State, Local not specified - PPO

## 2018-05-04 ENCOUNTER — Ambulatory Visit (INDEPENDENT_AMBULATORY_CARE_PROVIDER_SITE_OTHER): Payer: Federal, State, Local not specified - PPO | Admitting: Obstetrics and Gynecology

## 2018-05-04 ENCOUNTER — Other Ambulatory Visit (HOSPITAL_COMMUNITY): Payer: Self-pay | Admitting: *Deleted

## 2018-05-04 ENCOUNTER — Encounter (HOSPITAL_COMMUNITY): Payer: Self-pay

## 2018-05-04 ENCOUNTER — Ambulatory Visit (HOSPITAL_COMMUNITY)
Admission: RE | Admit: 2018-05-04 | Discharge: 2018-05-04 | Disposition: A | Discharge status: Home / Self Care | Payer: Federal, State, Local not specified - PPO | Source: Ambulatory Visit | Attending: Obstetrics and Gynecology | Admitting: Obstetrics and Gynecology

## 2018-05-04 VITALS — BP 122/76 | HR 89 | Wt 175.0 lb

## 2018-05-04 DIAGNOSIS — O99012 Anemia complicating pregnancy, second trimester: Secondary | ICD-10-CM | POA: Insufficient documentation

## 2018-05-04 DIAGNOSIS — O34592 Maternal care for other abnormalities of gravid uterus, second trimester: Secondary | ICD-10-CM | POA: Insufficient documentation

## 2018-05-04 DIAGNOSIS — Z3A26 26 weeks gestation of pregnancy: Secondary | ICD-10-CM | POA: Insufficient documentation

## 2018-05-04 DIAGNOSIS — O3400 Maternal care for unspecified congenital malformation of uterus, unspecified trimester: Secondary | ICD-10-CM

## 2018-05-04 DIAGNOSIS — O0990 Supervision of high risk pregnancy, unspecified, unspecified trimester: Secondary | ICD-10-CM | POA: Diagnosis not present

## 2018-05-04 DIAGNOSIS — D563 Thalassemia minor: Secondary | ICD-10-CM

## 2018-05-04 DIAGNOSIS — Z23 Encounter for immunization: Secondary | ICD-10-CM

## 2018-05-04 DIAGNOSIS — O34599 Maternal care for other abnormalities of gravid uterus, unspecified trimester: Secondary | ICD-10-CM

## 2018-05-04 DIAGNOSIS — Z362 Encounter for other antenatal screening follow-up: Secondary | ICD-10-CM | POA: Insufficient documentation

## 2018-05-04 DIAGNOSIS — Q513 Bicornate uterus: Secondary | ICD-10-CM | POA: Insufficient documentation

## 2018-05-04 DIAGNOSIS — D500 Iron deficiency anemia secondary to blood loss (chronic): Secondary | ICD-10-CM

## 2018-05-04 DIAGNOSIS — O0992 Supervision of high risk pregnancy, unspecified, second trimester: Secondary | ICD-10-CM

## 2018-05-04 NOTE — Addendum Note (Signed)
Addended by: Ernestina PatchesAPEL, Breeann Reposa S on: 05/04/2018 09:54 AM   Modules accepted: Orders

## 2018-05-04 NOTE — Progress Notes (Signed)
Prenatal Visit Note Date: 05/04/2018 Clinic: [facility]  Subjective:  Judy Rodgers is a 22 y.o. G1P0000 at 7243w5d being seen today for ongoing prenatal care.  She is currently monitored for the following issues for this low-risk pregnancy and has Migraine without aura and without status migrainosus, not intractable; Acne; Constipation; Irregular menses; Anemia; Insomnia; Supervision of high risk pregnancy, antepartum; Uterine abnormality during pregnancy, antepartum; Beta thalassemia minor; Iron deficiency anemia due to chronic blood loss; and Anemia of mother in pregnancy, antepartum, second trimester on their problem list.  Patient reports no complaints.   Contractions: Not present. Vag. Bleeding: None.  Movement: Present. Denies leaking of fluid.   The following portions of the patient's history were reviewed and updated as appropriate: allergies, current medications, past family history, past medical history, past social history, past surgical history and problem list. Problem list updated.  Objective:   Vitals:   05/04/18 0912  BP: 122/76  Pulse: 89  Weight: 175 lb (79.4 kg)    Fetal Status: Fetal Heart Rate (bpm): 150   Movement: Present     General:  Alert, oriented and cooperative. Patient is in no acute distress.  Skin: Skin is warm and dry. No rash noted.   Cardiovascular: Normal heart rate noted  Respiratory: Normal respiratory effort, no problems with respiration noted  Abdomen: Soft, gravid, appropriate for gestational age. Pain/Pressure: Absent     Pelvic:  Cervical exam deferred        Extremities: Normal range of motion.  Edema: None  Mental Status: Normal mood and affect. Normal behavior. Normal judgment and thought content.   Urinalysis:      Assessment and Plan:  Pregnancy: G1P0000 at 7543w5d  1. Supervision of high risk pregnancy, antepartum Routine care. D/w pt at nv re: BC. Has growth for later today.  - Glucose tolerance, 1 hour -  Iron and TIBC - Ferritin - CBC With Differential - Retic  2. Uterine abnormality during pregnancy, antepartum No current issues  3. Beta thalassemia minor Will repeat labs today.   4. Iron deficiency anemia due to chronic blood loss   5. Anemia of mother in pregnancy, antepartum, second trimester   Preterm labor symptoms and general obstetric precautions including but not limited to vaginal bleeding, contractions, leaking of fluid and fetal movement were reviewed in detail with the patient. Please refer to After Visit Summary for other counseling recommendations.  Return in about 2 weeks (around 05/18/2018) for low or hrob visit.   [place] BingPickens, Copelan Maultsby, MD

## 2018-05-05 ENCOUNTER — Inpatient Hospital Stay (HOSPITAL_COMMUNITY)
Admission: AD | Admit: 2018-05-05 | Discharge: 2018-05-05 | Disposition: A | Discharge status: Home / Self Care | Payer: Federal, State, Local not specified - PPO | Source: Ambulatory Visit | Attending: Obstetrics & Gynecology | Admitting: Obstetrics & Gynecology

## 2018-05-05 ENCOUNTER — Encounter: Payer: Self-pay | Admitting: Obstetrics and Gynecology

## 2018-05-05 ENCOUNTER — Ambulatory Visit (HOSPITAL_COMMUNITY): Payer: Federal, State, Local not specified - PPO

## 2018-05-05 ENCOUNTER — Other Ambulatory Visit: Payer: Self-pay

## 2018-05-05 ENCOUNTER — Encounter (HOSPITAL_COMMUNITY): Payer: Self-pay | Admitting: *Deleted

## 2018-05-05 DIAGNOSIS — Z8249 Family history of ischemic heart disease and other diseases of the circulatory system: Secondary | ICD-10-CM | POA: Diagnosis not present

## 2018-05-05 DIAGNOSIS — Z3A27 27 weeks gestation of pregnancy: Secondary | ICD-10-CM | POA: Diagnosis not present

## 2018-05-05 DIAGNOSIS — O34599 Maternal care for other abnormalities of gravid uterus, unspecified trimester: Secondary | ICD-10-CM

## 2018-05-05 DIAGNOSIS — R110 Nausea: Secondary | ICD-10-CM

## 2018-05-05 DIAGNOSIS — Z880 Allergy status to penicillin: Secondary | ICD-10-CM | POA: Insufficient documentation

## 2018-05-05 DIAGNOSIS — Z9889 Other specified postprocedural states: Secondary | ICD-10-CM | POA: Insufficient documentation

## 2018-05-05 DIAGNOSIS — O212 Late vomiting of pregnancy: Secondary | ICD-10-CM | POA: Diagnosis not present

## 2018-05-05 DIAGNOSIS — R42 Dizziness and giddiness: Secondary | ICD-10-CM | POA: Insufficient documentation

## 2018-05-05 DIAGNOSIS — O26892 Other specified pregnancy related conditions, second trimester: Secondary | ICD-10-CM | POA: Diagnosis not present

## 2018-05-05 DIAGNOSIS — Z881 Allergy status to other antibiotic agents status: Secondary | ICD-10-CM | POA: Insufficient documentation

## 2018-05-05 DIAGNOSIS — O0990 Supervision of high risk pregnancy, unspecified, unspecified trimester: Secondary | ICD-10-CM

## 2018-05-05 DIAGNOSIS — O34593 Maternal care for other abnormalities of gravid uterus, third trimester: Secondary | ICD-10-CM

## 2018-05-05 DIAGNOSIS — Z79899 Other long term (current) drug therapy: Secondary | ICD-10-CM | POA: Insufficient documentation

## 2018-05-05 DIAGNOSIS — O0993 Supervision of high risk pregnancy, unspecified, third trimester: Secondary | ICD-10-CM

## 2018-05-05 LAB — CBC WITH DIFFERENTIAL
BASOS: 0 %
Basophils Absolute: 0 10*3/uL (ref 0.0–0.2)
EOS (ABSOLUTE): 0.2 10*3/uL (ref 0.0–0.4)
EOS: 2 %
HEMATOCRIT: 33.6 % — AB (ref 34.0–46.6)
Hemoglobin: 10.7 g/dL — ABNORMAL LOW (ref 11.1–15.9)
Immature Grans (Abs): 0 10*3/uL (ref 0.0–0.1)
Immature Granulocytes: 0 %
LYMPHS ABS: 1.6 10*3/uL (ref 0.7–3.1)
Lymphs: 21 %
MCH: 25.1 pg — ABNORMAL LOW (ref 26.6–33.0)
MCHC: 31.8 g/dL (ref 31.5–35.7)
MCV: 79 fL (ref 79–97)
MONOS ABS: 0.6 10*3/uL (ref 0.1–0.9)
Monocytes: 9 %
Neutrophils Absolute: 4.9 10*3/uL (ref 1.4–7.0)
Neutrophils: 68 %
RBC: 4.27 x10E6/uL (ref 3.77–5.28)
RDW: 19.2 % — AB (ref 12.3–15.4)
WBC: 7.3 10*3/uL (ref 3.4–10.8)

## 2018-05-05 LAB — FERRITIN: Ferritin: 10 ng/mL — ABNORMAL LOW (ref 15–150)

## 2018-05-05 LAB — URINALYSIS, ROUTINE W REFLEX MICROSCOPIC
BILIRUBIN URINE: NEGATIVE
Glucose, UA: NEGATIVE mg/dL
HGB URINE DIPSTICK: NEGATIVE
Ketones, ur: NEGATIVE mg/dL
NITRITE: NEGATIVE
Protein, ur: NEGATIVE mg/dL
Specific Gravity, Urine: 1.013 (ref 1.005–1.030)
pH: 7 (ref 5.0–8.0)

## 2018-05-05 LAB — CBC
HEMOGLOBIN: 10.9 g/dL — AB (ref 11.1–15.9)
Hematocrit: 33.4 % — ABNORMAL LOW (ref 34.0–46.6)
MCH: 25.8 pg — ABNORMAL LOW (ref 26.6–33.0)
MCHC: 32.6 g/dL (ref 31.5–35.7)
MCV: 79 fL (ref 79–97)
Platelets: 194 10*3/uL (ref 150–450)
RBC: 4.23 x10E6/uL (ref 3.77–5.28)
RDW: 18.9 % — ABNORMAL HIGH (ref 12.3–15.4)
WBC: 7.1 10*3/uL (ref 3.4–10.8)

## 2018-05-05 LAB — IRON AND TIBC
Iron Saturation: 9 % — CL (ref 15–55)
Iron: 34 ug/dL (ref 27–159)
TIBC: 384 ug/dL (ref 250–450)
UIBC: 350 ug/dL (ref 131–425)

## 2018-05-05 LAB — HIV ANTIBODY (ROUTINE TESTING W REFLEX): HIV SCREEN 4TH GENERATION: NONREACTIVE

## 2018-05-05 LAB — RETICULOCYTES: Retic Ct Pct: 1 % (ref 0.6–2.6)

## 2018-05-05 LAB — GLUCOSE TOLERANCE, 1 HOUR: GLUCOSE, 1HR PP: 83 mg/dL (ref 65–199)

## 2018-05-05 LAB — RPR: RPR: NONREACTIVE

## 2018-05-05 MED ORDER — ONDANSETRON 8 MG PO TBDP
8.0000 mg | ORAL_TABLET | Freq: Once | ORAL | Status: AC
  Administered 2018-05-05: 8 mg via ORAL
  Filled 2018-05-05: qty 1

## 2018-05-05 NOTE — MAU Note (Signed)
Been feeling dizzy and nausea since 0500. Hasn't thrown up in a couple hrs. Feels really dizzy. Did a glucose test and got a tdap shot yesterday- felt fine after that.  No longer really feeling nausea, but if she moves too much, she starts to get nauseous.

## 2018-05-05 NOTE — MAU Provider Note (Signed)
History     CSN: [id number]  Arrival date and time: 05/05/18 1512   First Provider Initiated Contact with Patient 05/05/18 1733      Chief Complaint  Patient presents with  . Emesis   HPI  Ms.  Kymari Lollis is a 22 y.o. year old G56P0000 female at [redacted]w[redacted]d weeks gestation who presents to MAU reporting dizzy and nausea since 0500. She hasn't thrown up in about 2-3 hrs. She had her GTT and TdaP yesterday; she felt fine afterward. The test was about 1000 and she did not eat again until 1900. At that time she ate a small piece of steak, potato, broccoli and cheese. She woke up this morning at 0500 "not feeling right" and began vomiting. She has vomited several times today, but able to keep down Gatorade.  Past Medical History:  Diagnosis Date  . Acne   . Bicornuate uterus   . Environmental allergies   . Headache(784.0)    chronic ha takes motrin 800mg  PRN    Past Surgical History:  Procedure Laterality Date  . TONSILLECTOMY    . TONSILLECTOMY    . WISDOM TOOTH EXTRACTION  09/2015    Family History  Problem Relation Age of Onset  . Heart disease Maternal Grandmother     Social History   Tobacco Use  . Smoking status: Never Smoker  . Smokeless tobacco: Never Used  Substance Use Topics  . Alcohol use: No  . Drug use: No    Types: Other-see comments    Comment: marijuana - maybe in early pregnancy    Allergies:  Allergies  Allergen Reactions  . Penicillins Nausea And Vomiting    Has patient had a PCN reaction causing immediate rash, facial/tongue/throat swelling, SOB or lightheadedness with hypotension: No Has patient had a PCN reaction causing severe rash involving mucus membranes or skin necrosis: No Has patient had a PCN reaction that required hospitalization: No Has patient had a PCN reaction occurring within the last 10 years: No If all of the above answers are "NO", then may proceed with Cephalosporin use.   Marland Kitchen Amoxicillin-Pot Clavulanate Nausea And Vomiting  and Other (See Comments)    vertigo  . Metronidazole Nausea And Vomiting and Rash    Nausea - tablets, rash - gel    Medications Prior to Admission  Medication Sig Dispense Refill Last Dose  . acetaminophen (TYLENOL) 325 MG tablet Take 650 mg by mouth every 6 (six) hours as needed.   Taking  . Cyanocobalamin (VITAMIN B 12 PO) Take by mouth.   Taking  . diphenhydrAMINE (BENADRYL) 25 mg capsule Take 25 mg by mouth every 6 (six) hours as needed for allergies or sleep.   Not Taking  . Prenatal Multivit-Min-Fe-FA (PRENATAL VITAMINS) 0.8 MG tablet Take 1 tablet by mouth daily. 30 tablet 12 Taking  . zolpidem (AMBIEN) 5 MG tablet Take 1 tablet (5 mg total) by mouth at bedtime as needed for sleep. (Patient not taking: Reported on 05/04/2018) 15 tablet 0 Not Taking    Review of Systems  Constitutional: Positive for appetite change.  HENT: Negative.   Eyes: Negative.   Respiratory: Negative.   Cardiovascular: Negative.   Gastrointestinal: Positive for nausea and vomiting.  Endocrine: Negative.   Genitourinary: Negative.   Musculoskeletal: Negative.   Skin: Negative.   Allergic/Immunologic: Negative.   Neurological: Positive for weakness and light-headedness.  Hematological: Negative.   Psychiatric/Behavioral: Negative.    Physical Exam   Blood pressure 127/60, pulse 75, temperature 98.7 F (37.1  C), temperature source Oral, resp. rate 16, weight 172 lb 8 oz (78.2 kg), last menstrual period 10/22/2017, SpO2 100 %.  Physical Exam  Nursing note and vitals reviewed. Constitutional: She is oriented to person, place, and time. She appears well-developed and well-nourished.  HENT:  Head: Normocephalic and atraumatic.  Eyes: Pupils are equal, round, and reactive to light.  Neck: Normal range of motion.  Cardiovascular: Normal rate, regular rhythm and normal heart sounds.  Respiratory: Effort normal and breath sounds normal.  GI: Soft. Bowel sounds are decreased.  Genitourinary:   Genitourinary Comments: pelvic deferred  Musculoskeletal: Normal range of motion.  Neurological: She is alert and oriented to person, place, and time.  Skin: Skin is warm and dry.  Skin turgor WNL  Psychiatric: She has a normal mood and affect. Her behavior is normal. Judgment and thought content normal.    MAU Course  Procedures  MDM CCUA Tolerated peanut butter and graham crackers prior to Zofran Zofran 8 mg ODT -- no vomiting, tolerated solids and liquids NST - FHR: 155 bpm / moderate variability / accels present / decels absent / TOCO: none   Results for orders placed or performed during the hospital encounter of 05/05/18 (from the past 24 hour(s))  Urinalysis, Routine w reflex microscopic     Status: Abnormal   Collection Time: 05/05/18  4:13 PM  Result Value Ref Range   Color, Urine YELLOW YELLOW   APPearance CLEAR CLEAR   Specific Gravity, Urine 1.013 1.005 - 1.030   pH 7.0 5.0 - 8.0   Glucose, UA NEGATIVE NEGATIVE mg/dL   Hgb urine dipstick NEGATIVE NEGATIVE   Bilirubin Urine NEGATIVE NEGATIVE   Ketones, ur NEGATIVE NEGATIVE mg/dL   Protein, ur NEGATIVE NEGATIVE mg/dL   Nitrite NEGATIVE NEGATIVE   Leukocytes, UA MODERATE (A) NEGATIVE   RBC / HPF 0-5 0 - 5 RBC/hpf   WBC, UA 0-5 0 - 5 WBC/hpf   Bacteria, UA RARE (A) NONE SEEN   Squamous Epithelial / LPF 0-5 0 - 5   Mucus PRESENT     Assessment and Plan  Nausea  -Advised to eat protein rich foods and limit daily carb intake  - Advised to purchase protein shakes to have protein available while on-the-go - Information provided on protein rich diet & protein contents of foods  - Discharge patient - Keep scheduled appt with [facility] on 05/19/18 - Patient verbalized an understanding of the plan of care and agrees.      Raelyn Moraolitta Shelba Susi, MSN, CNM 05/05/2018, 5:36 PM

## 2018-05-05 NOTE — Discharge Instructions (Signed)
Try Premier Protein shakes for on-the-go protein snacks

## 2018-05-11 ENCOUNTER — Encounter: Payer: Self-pay | Admitting: Obstetrics and Gynecology

## 2018-05-13 ENCOUNTER — Other Ambulatory Visit: Payer: Self-pay | Admitting: Family Medicine

## 2018-05-13 DIAGNOSIS — Z8669 Personal history of other diseases of the nervous system and sense organs: Secondary | ICD-10-CM

## 2018-05-19 ENCOUNTER — Telehealth: Payer: Self-pay | Admitting: *Deleted

## 2018-05-19 ENCOUNTER — Encounter: Payer: Self-pay | Admitting: General Practice

## 2018-05-19 ENCOUNTER — Other Ambulatory Visit (HOSPITAL_COMMUNITY)
Admission: RE | Admit: 2018-05-19 | Discharge: 2018-05-19 | Disposition: A | Discharge status: Home / Self Care | Payer: Federal, State, Local not specified - PPO | Source: Ambulatory Visit | Attending: Obstetrics and Gynecology | Admitting: Obstetrics and Gynecology

## 2018-05-19 ENCOUNTER — Ambulatory Visit (INDEPENDENT_AMBULATORY_CARE_PROVIDER_SITE_OTHER): Payer: Federal, State, Local not specified - PPO | Admitting: Obstetrics & Gynecology

## 2018-05-19 ENCOUNTER — Encounter: Payer: Self-pay | Admitting: Obstetrics & Gynecology

## 2018-05-19 VITALS — BP 116/76 | HR 88 | Wt 177.1 lb

## 2018-05-19 DIAGNOSIS — O26893 Other specified pregnancy related conditions, third trimester: Secondary | ICD-10-CM

## 2018-05-19 DIAGNOSIS — O99013 Anemia complicating pregnancy, third trimester: Secondary | ICD-10-CM

## 2018-05-19 DIAGNOSIS — N898 Other specified noninflammatory disorders of vagina: Secondary | ICD-10-CM

## 2018-05-19 DIAGNOSIS — Z029 Encounter for administrative examinations, unspecified: Secondary | ICD-10-CM

## 2018-05-19 DIAGNOSIS — O0990 Supervision of high risk pregnancy, unspecified, unspecified trimester: Secondary | ICD-10-CM

## 2018-05-19 MED ORDER — TERCONAZOLE 0.8 % VA CREA: 1.0000 | TOPICAL_CREAM | Freq: Every day | VAGINAL | 3 refills | Status: DC

## 2018-05-19 NOTE — Patient Instructions (Signed)
AREA PEDIATRIC/FAMILY PRACTICE PHYSICIANS  [facility] [address], [address] [place], [place]  [address] Phone - [phone]   Fax - [phone]  [facility] [address] [place], [place] [address] Phone - [phone]   Fax - [phone]  [facility] [address] [place], [place]  [address] Phone - [phone]   Fax - [phone]  [facility] [address] [place], [place]  [address] Phone - [phone]   Fax - [phone]  [facility] [address] [place], [place]  [address] Phone - [phone]   Fax - [phone]  [facility] [address], [address] [place], [place]  [address] Phone - [phone]   Fax - [phone]  [facility] [address], [address] [place], [place]  [address] Phone - [phone]   Fax - [phone]  [facility] [address], [address] [place], [place]  [address] Phone - [phone]   Fax - [phone]  [facility] [address] [place], [place]  [address] Phone - [phone]   Fax - [phone] [facility] [address] [place], [place]  [address] Phone - [phone]   Fax - [phone]  [facility] [address] [place], [place]  [address] Phone - [phone]   Fax - [phone]  [facility] [address] [place], [place]  [address] Phone - [phone]   Fax - [phone]  [facility] [address], [address] [place], [place]  [address] Phone - [phone]   Fax - [phone]  [facility] GOS[facility] [address] [place], [place]  [address] Phone - [phone]  [facility] [address] [place], [place]  [address] Phone - [phone]   Fax - [phone]  [facility] [address] [place], [place]  [address] Phone - [phone]   Fax - [phone]  [facility] [address] [place], [place]  [address] Phone - [phone]   Fax - [phone]  [facility] [address] [place], [place]  [address] Phone - [phone]   Fax - [phone]   [facility] [address] [place], [place]  [address] Phone - [phone]   Fax - [phone]  [facility] [address] [place], [place]  [address] Phone - [phone]   Fax - [phone]  [facility] [address] [place], [place]  [address] Phone - [phone]   Fax - [phone]  [facility] [address] [place], [place]  [address] Phone - [phone]   Fax - [phone]  [facility] [address] , [place]  [address] Phone - [phone]   Fax - [phone] [facility] [address] [place], [place]  [address] Phone - [phone]   Fax - [phone]  [facility] [address] [place], [place]  [address] Phone - [phone]   Fax - [phone]  [facility] [address] [place], [place]  [address] Phone - [phone]   Fax - [phone]  [facility] Charlene Flemming MD [address] [place]  [address] Phone [phone]  Fax [phone]   

## 2018-05-19 NOTE — Telephone Encounter (Signed)
Needs feraheme infusion per Dr. Macon LargeAnyanwu- called [facility] [phone] and left message need to schedule feraheme infusion- please call our office to let us know you receive message and appt and if we need to call patient. Dr. Macon LargeAnyanwu has placed the orders.

## 2018-05-19 NOTE — Progress Notes (Signed)
PRENATAL VISIT NOTE  Subjective:  Judy Rodgers is a 22 y.o. G1P0000 at [redacted]w[redacted]d being seen today for ongoing prenatal care.  She is currently monitored for the following issues for this high-risk pregnancy and has Migraine without aura and without status migrainosus, not intractable; Acne; Constipation; Irregular menses; Anemia; Insomnia; Supervision of high risk pregnancy, antepartum; Uterine abnormality during pregnancy, antepartum; Beta thalassemia minor; Iron deficiency anemia due to chronic blood loss; and Anemia in pregnancy, third trimester on their problem list.  Patient reports vaginal irritation for a few days with white discharge.  Also reports increased PICA; feels she is anemic and her recent iron level was low. Has received Feraheme in the past.   Contractions: Not present. Vag. Bleeding: None.  Movement: Present. Denies leaking of fluid.   The following portions of the patient's history were reviewed and updated as appropriate: allergies, current medications, past family history, past medical history, past social history, past surgical history and problem list. Problem list updated.  Objective:   Vitals:   05/19/18 1646  BP: 116/76  Pulse: 88  Weight: 177 lb 1.6 oz (80.3 kg)    Fetal Status: Fetal Heart Rate (bpm): 151 Fundal Height: 30 cm Movement: Present     General:  Alert, oriented and cooperative. Patient is in no acute distress.  Skin: Skin is warm and dry. No rash noted.   Cardiovascular: Normal heart rate noted  Respiratory: Normal respiratory effort, no problems with respiration noted  Abdomen: Soft, gravid, appropriate for gestational age.  Pain/Pressure: Present     Pelvic: Cervical exam deferred       White discharge seen around introitus with some erythema.  Extremities: Normal range of motion.  Edema: None  Mental Status: Normal mood and affect. Normal behavior. Normal judgment and thought content.    Results for orders placed or performed during the  hospital encounter of 05/05/18 (from the past 672 hour(s))  Urinalysis, Routine w reflex microscopic   Collection Time: 05/05/18  4:13 PM  Result Value Ref Range   Color, Urine YELLOW YELLOW   APPearance CLEAR CLEAR   Specific Gravity, Urine 1.013 1.005 - 1.030   pH 7.0 5.0 - 8.0   Glucose, UA NEGATIVE NEGATIVE mg/dL   Hgb urine dipstick NEGATIVE NEGATIVE   Bilirubin Urine NEGATIVE NEGATIVE   Ketones, ur NEGATIVE NEGATIVE mg/dL   Protein, ur NEGATIVE NEGATIVE mg/dL   Nitrite NEGATIVE NEGATIVE   Leukocytes, UA MODERATE (A) NEGATIVE   RBC / HPF 0-5 0 - 5 RBC/hpf   WBC, UA 0-5 0 - 5 WBC/hpf   Bacteria, UA RARE (A) NONE SEEN   Squamous Epithelial / LPF 0-5 0 - 5   Mucus PRESENT   Results for orders placed or performed in visit on 05/04/18 (from the past 672 hour(s))  CBC   Collection Time: 05/04/18  9:39 AM  Result Value Ref Range   WBC 7.1 3.4 - 10.8 x10E3/uL   RBC 4.23 3.77 - 5.28 x10E6/uL   Hemoglobin 10.9 (L) 11.1 - 15.9 g/dL   Hematocrit 16.1 (L) 09.6 - 46.6 %   MCV 79 79 - 97 fL   MCH 25.8 (L) 26.6 - 33.0 pg   MCHC 32.6 31.5 - 35.7 g/dL   RDW 04.5 (H) 40.9 - 81.1 %   Platelets 194 150 - 450 x10E3/uL  HIV antibody   Collection Time: 05/04/18  9:39 AM  Result Value Ref Range   HIV Screen 4th Generation wRfx Non Reactive Non Reactive  RPR  Collection Time: 05/04/18  9:39 AM  Result Value Ref Range   RPR Ser Ql Non Reactive Non Reactive  Results for orders placed or performed in visit on 05/04/18 (from the past 672 hour(s))  Glucose tolerance, 1 hour   Collection Time: 05/04/18 10:44 AM  Result Value Ref Range   Glucose, 1Hr PP 83 65 - 199 mg/dL  Iron and TIBC   Collection Time: 05/04/18 10:44 AM  Result Value Ref Range   Total Iron Binding Capacity 384 250 - 450 ug/dL   UIBC 161350 096131 - 045425 ug/dL   Iron 34 27 - 409159 ug/dL   Iron Saturation 9 (LL) 15 - 55 %  Ferritin   Collection Time: 05/04/18 10:44 AM  Result Value Ref Range   Ferritin 10 (L) 15 - 150 ng/mL  CBC  With Differential   Collection Time: 05/04/18 10:44 AM  Result Value Ref Range   WBC 7.3 3.4 - 10.8 x10E3/uL   RBC 4.27 3.77 - 5.28 x10E6/uL   Hemoglobin 10.7 (L) 11.1 - 15.9 g/dL   Hematocrit 81.133.6 (L) 91.434.0 - 46.6 %   MCV 79 79 - 97 fL   MCH 25.1 (L) 26.6 - 33.0 pg   MCHC 31.8 31.5 - 35.7 g/dL   RDW 78.219.2 (H) 95.612.3 - 21.315.4 %   Neutrophils 68 Not Estab. %   Lymphs 21 Not Estab. %   Monocytes 9 Not Estab. %   Eos 2 Not Estab. %   Basos 0 Not Estab. %   Neutrophils Absolute 4.9 1.4 - 7.0 x10E3/uL   Lymphocytes Absolute 1.6 0.7 - 3.1 x10E3/uL   Monocytes Absolute 0.6 0.1 - 0.9 x10E3/uL   EOS (ABSOLUTE) 0.2 0.0 - 0.4 x10E3/uL   Basophils Absolute 0.0 0.0 - 0.2 x10E3/uL   Immature Granulocytes 0 Not Estab. %   Immature Grans (Abs) 0.0 0.0 - 0.1 x10E3/uL  Retic   Collection Time: 05/04/18 10:44 AM  Result Value Ref Range   Retic Ct Pct 1.0 0.6 - 2.6 %    Assessment and Plan:  Pregnancy: G1P0000 at 1571w6d  1. Iron deficiency anemia in pregnancy, third trimester Patient will get 2 doses of Feraheme given her low ferritin level.  Orders signed and held. [facility]  2. Vaginal discharge during pregnancy in third trimester Yeast presumptively treated, will follow up test results. - terconazole (TERAZOL 3) 0.8 % vaginal cream; Place 1 applicator vaginally at bedtime. Apply nightly for three nights.  Dispense: 20 g; Refill: 3 - Cervicovaginal ancillary only  3. Supervision of high risk pregnancy, antepartum Preterm labor symptoms and general obstetric precautions including but not limited to vaginal bleeding, contractions, leaking of fluid and fetal movement were reviewed in detail with the patient. Please refer to After Visit Summary for other counseling recommendations.  No follow-ups on file.  Future Appointments  Date Time Provider Department Center  06/03/2018  2:30 PM [facility] US 1 [facility]-M[facility]US [facility]-US  06/03/2018  3:55 PM Constant, Gigi GinPeggy, MD  [facility] WOC  06/16/2018  3:55 PM Constant[facility]    Jaynie CollinsUgonna Dameisha Tschida, MD

## 2018-05-19 NOTE — Progress Notes (Signed)
Patient states she thinks she has a yeast infection and was a little concerned about it.

## 2018-05-21 LAB — CERVICOVAGINAL ANCILLARY ONLY
Bacterial vaginitis: NEGATIVE
CANDIDA VAGINITIS: NEGATIVE
TRICH (WINDOWPATH): NEGATIVE

## 2018-05-21 NOTE — Telephone Encounter (Signed)
Appt for Feraheme infusion scheduled on 7/11 @ 1100.  Pt was called and informed of appt details.  She will be scheduled for 2nd dose following the initial infusion. She voiced understanding and agreed to plan of care.

## 2018-05-27 ENCOUNTER — Ambulatory Visit (HOSPITAL_COMMUNITY)
Admission: RE | Admit: 2018-05-27 | Discharge: 2018-05-27 | Disposition: A | Discharge status: Home / Self Care | Payer: Federal, State, Local not specified - PPO | Source: Ambulatory Visit | Attending: Obstetrics & Gynecology | Admitting: Obstetrics & Gynecology

## 2018-05-27 DIAGNOSIS — Z3A00 Weeks of gestation of pregnancy not specified: Secondary | ICD-10-CM | POA: Diagnosis not present

## 2018-05-27 DIAGNOSIS — D509 Iron deficiency anemia, unspecified: Secondary | ICD-10-CM | POA: Insufficient documentation

## 2018-05-27 DIAGNOSIS — O99019 Anemia complicating pregnancy, unspecified trimester: Secondary | ICD-10-CM | POA: Diagnosis not present

## 2018-05-27 MED ORDER — SODIUM CHLORIDE 0.9 % IV SOLN
510.0000 mg | INTRAVENOUS | Status: DC
  Administered 2018-05-27: 510 mg via INTRAVENOUS
  Filled 2018-05-27: qty 17

## 2018-05-27 NOTE — Discharge Instructions (Signed)

## 2018-06-03 ENCOUNTER — Other Ambulatory Visit (HOSPITAL_COMMUNITY): Payer: Self-pay | Admitting: Obstetrics and Gynecology

## 2018-06-03 ENCOUNTER — Ambulatory Visit (HOSPITAL_COMMUNITY)
Admission: RE | Admit: 2018-06-03 | Discharge: 2018-06-03 | Disposition: A | Discharge status: Home / Self Care | Payer: Federal, State, Local not specified - PPO | Source: Ambulatory Visit | Attending: Obstetrics & Gynecology | Admitting: Obstetrics & Gynecology

## 2018-06-03 ENCOUNTER — Other Ambulatory Visit (HOSPITAL_COMMUNITY): Payer: Self-pay | Admitting: *Deleted

## 2018-06-03 ENCOUNTER — Ambulatory Visit (INDEPENDENT_AMBULATORY_CARE_PROVIDER_SITE_OTHER): Payer: Federal, State, Local not specified - PPO | Admitting: Obstetrics and Gynecology

## 2018-06-03 ENCOUNTER — Ambulatory Visit (HOSPITAL_COMMUNITY)
Admission: RE | Admit: 2018-06-03 | Discharge: 2018-06-03 | Disposition: A | Discharge status: Home / Self Care | Payer: Federal, State, Local not specified - PPO | Source: Ambulatory Visit | Attending: Obstetrics and Gynecology | Admitting: Obstetrics and Gynecology

## 2018-06-03 ENCOUNTER — Encounter (HOSPITAL_COMMUNITY): Payer: Self-pay

## 2018-06-03 ENCOUNTER — Encounter: Payer: Self-pay | Admitting: Obstetrics and Gynecology

## 2018-06-03 VITALS — BP 123/75 | HR 84 | Wt 181.3 lb

## 2018-06-03 DIAGNOSIS — Z362 Encounter for other antenatal screening follow-up: Secondary | ICD-10-CM

## 2018-06-03 DIAGNOSIS — O34593 Maternal care for other abnormalities of gravid uterus, third trimester: Secondary | ICD-10-CM | POA: Insufficient documentation

## 2018-06-03 DIAGNOSIS — O34592 Maternal care for other abnormalities of gravid uterus, second trimester: Secondary | ICD-10-CM

## 2018-06-03 DIAGNOSIS — Q513 Bicornate uterus: Principal | ICD-10-CM

## 2018-06-03 DIAGNOSIS — O0993 Supervision of high risk pregnancy, unspecified, third trimester: Secondary | ICD-10-CM

## 2018-06-03 DIAGNOSIS — O3400 Maternal care for unspecified congenital malformation of uterus, unspecified trimester: Secondary | ICD-10-CM

## 2018-06-03 DIAGNOSIS — O34599 Maternal care for other abnormalities of gravid uterus, unspecified trimester: Secondary | ICD-10-CM

## 2018-06-03 DIAGNOSIS — Z3A32 32 weeks gestation of pregnancy: Secondary | ICD-10-CM | POA: Diagnosis not present

## 2018-06-03 DIAGNOSIS — Z3A31 31 weeks gestation of pregnancy: Secondary | ICD-10-CM

## 2018-06-03 DIAGNOSIS — O0990 Supervision of high risk pregnancy, unspecified, unspecified trimester: Secondary | ICD-10-CM

## 2018-06-03 DIAGNOSIS — O99013 Anemia complicating pregnancy, third trimester: Secondary | ICD-10-CM

## 2018-06-03 MED ORDER — FERUMOXYTOL INJECTION 510 MG/17 ML
510.0000 mg | INTRAVENOUS | Status: DC
  Administered 2018-06-03: 510 mg via INTRAVENOUS
  Filled 2018-06-03: qty 17

## 2018-06-03 NOTE — Progress Notes (Signed)
   PRENATAL VISIT NOTE  Subjective:  Judy Rodgers is a 22 y.o. G1P0000 at 1447w0d being seen today for ongoing prenatal care.  She is currently monitored for the following issues for this high-risk pregnancy and has Migraine without aura and without status migrainosus, not intractable; Acne; Constipation; Irregular menses; Anemia; Insomnia; Supervision of high risk pregnancy, antepartum; Uterine abnormality during pregnancy, antepartum; Beta thalassemia minor; Iron deficiency anemia due to chronic blood loss; and Anemia in pregnancy, third trimester on their problem list.  Patient reports no complaints.  Contractions: Irritability. Vag. Bleeding: None.  Movement: Present. Denies leaking of fluid.   The following portions of the patient's history were reviewed and updated as appropriate: allergies, current medications, past family history, past medical history, past social history, past surgical history and problem list. Problem list updated.  Objective:   Vitals:   06/03/18 1557  BP: 123/75  Pulse: 84  Weight: 181 lb 4.8 oz (82.2 kg)    Fetal Status: Fetal Heart Rate (bpm): 146 Fundal Height: 32 cm Movement: Present     General:  Alert, oriented and cooperative. Patient is in no acute distress.  Skin: Skin is warm and dry. No rash noted.   Cardiovascular: Normal heart rate noted  Respiratory: Normal respiratory effort, no problems with respiration noted  Abdomen: Soft, gravid, appropriate for gestational age.  Pain/Pressure: Present     Pelvic: Cervical exam deferred        Extremities: Normal range of motion.  Edema: Trace  Mental Status: Normal mood and affect. Normal behavior. Normal judgment and thought content.   Assessment and Plan:  Pregnancy: G1P0000 at 8147w0d  1. Supervision of high risk pregnancy, antepartum Patient is doing well without complaints Follow up ultrasound done today- report not yet available  Preterm labor symptoms and general obstetric precautions  including but not limited to vaginal bleeding, contractions, leaking of fluid and fetal movement were reviewed in detail with the patient. Please refer to After Visit Summary for other counseling recommendations.  Return in about 2 weeks (around 06/17/2018) for ROB.  Future Appointments  Date Time Provider Department Center  06/16/2018  3:55 PM Joseantonio Dittmar, Gigi GinPeggy, MD [facility]OC  06/24/2018  1:15 PM [facility]-M[facility] US 4 WH-MFCUS MFC-US    Catalina AntiguaPeggy Shantea Poulton, MD

## 2018-06-16 ENCOUNTER — Encounter: Payer: Self-pay | Admitting: Obstetrics and Gynecology

## 2018-06-16 ENCOUNTER — Ambulatory Visit (INDEPENDENT_AMBULATORY_CARE_PROVIDER_SITE_OTHER): Payer: Federal, State, Local not specified - PPO | Admitting: Obstetrics and Gynecology

## 2018-06-16 VITALS — BP 132/70 | HR 68 | Wt 181.0 lb

## 2018-06-16 DIAGNOSIS — O0990 Supervision of high risk pregnancy, unspecified, unspecified trimester: Secondary | ICD-10-CM

## 2018-06-16 NOTE — Progress Notes (Signed)
   PRENATAL VISIT NOTE  Subjective:  Judy Rodgers is a 22 y.o. G1P0000 at 5144w6d being seen today for ongoing prenatal care.  She is currently monitored for the following issues for this low-risk pregnancy and has Migraine without aura and without status migrainosus, not intractable; Acne; Constipation; Irregular menses; Anemia; Insomnia; Supervision of high risk pregnancy, antepartum; Uterine abnormality during pregnancy, antepartum; Beta thalassemia minor; Iron deficiency anemia due to chronic blood loss; and Anemia in pregnancy, third trimester on their problem list.  Patient reports no complaints.  Contractions: Irritability. Vag. Bleeding: None.  Movement: Present. Denies leaking of fluid.   The following portions of the patient's history were reviewed and updated as appropriate: allergies, current medications, past family history, past medical history, past social history, past surgical history and problem list. Problem list updated.  Objective:   Vitals:   06/16/18 1619  BP: 132/70  Pulse: 68  Weight: 181 lb (82.1 kg)    Fetal Status: Fetal Heart Rate (bpm): 151 Fundal Height: 33 cm Movement: Present     General:  Alert, oriented and cooperative. Patient is in no acute distress.  Skin: Skin is warm and dry. No rash noted.   Cardiovascular: Normal heart rate noted  Respiratory: Normal respiratory effort, no problems with respiration noted  Abdomen: Soft, gravid, appropriate for gestational age.  Pain/Pressure: Absent     Pelvic: Cervical exam deferred        Extremities: Normal range of motion.  Edema: Trace  Mental Status: Normal mood and affect. Normal behavior. Normal judgment and thought content.   Assessment and Plan:  Pregnancy: G1P0000 at 4144w6d  1. Supervision of high risk pregnancy, antepartum Patient is doing well without complaints 7/18 ultrasound reviewed and explained to the patient. Follow up growth ultrasound on 8/8  Preterm labor symptoms and general  obstetric precautions including but not limited to vaginal bleeding, contractions, leaking of fluid and fetal movement were reviewed in detail with the patient. Please refer to After Visit Summary for other counseling recommendations.  Return in about 2 weeks (around 06/30/2018) for ROB.  Future Appointments  Date Time Provider Department Center  06/24/2018  1:15 PM [facility]-MFC US 4 WH-MFCUS MFC-US    Catalina AntiguaPeggy Lenwood Balsam, MD

## 2018-06-24 ENCOUNTER — Encounter (HOSPITAL_COMMUNITY): Payer: Self-pay

## 2018-06-24 ENCOUNTER — Other Ambulatory Visit (HOSPITAL_COMMUNITY): Payer: Self-pay | Admitting: *Deleted

## 2018-06-24 ENCOUNTER — Telehealth: Payer: Self-pay | Admitting: Family Medicine

## 2018-06-24 ENCOUNTER — Ambulatory Visit (HOSPITAL_COMMUNITY)
Admission: RE | Admit: 2018-06-24 | Discharge: 2018-06-24 | Disposition: A | Discharge status: Home / Self Care | Payer: Federal, State, Local not specified - PPO | Source: Ambulatory Visit | Attending: Obstetrics and Gynecology | Admitting: Obstetrics and Gynecology

## 2018-06-24 ENCOUNTER — Other Ambulatory Visit (HOSPITAL_COMMUNITY): Payer: Self-pay | Admitting: Obstetrics and Gynecology

## 2018-06-24 DIAGNOSIS — Z3A34 34 weeks gestation of pregnancy: Secondary | ICD-10-CM | POA: Insufficient documentation

## 2018-06-24 DIAGNOSIS — Q513 Bicornate uterus: Secondary | ICD-10-CM | POA: Insufficient documentation

## 2018-06-24 DIAGNOSIS — Z362 Encounter for other antenatal screening follow-up: Secondary | ICD-10-CM

## 2018-06-24 DIAGNOSIS — D569 Thalassemia, unspecified: Secondary | ICD-10-CM

## 2018-06-24 DIAGNOSIS — O365930 Maternal care for other known or suspected poor fetal growth, third trimester, not applicable or unspecified: Secondary | ICD-10-CM | POA: Insufficient documentation

## 2018-06-24 DIAGNOSIS — O99013 Anemia complicating pregnancy, third trimester: Secondary | ICD-10-CM

## 2018-06-24 DIAGNOSIS — O283 Abnormal ultrasonic finding on antenatal screening of mother: Secondary | ICD-10-CM

## 2018-06-24 DIAGNOSIS — O3403 Maternal care for unspecified congenital malformation of uterus, third trimester: Secondary | ICD-10-CM

## 2018-06-24 DIAGNOSIS — O34599 Maternal care for other abnormalities of gravid uterus, unspecified trimester: Secondary | ICD-10-CM

## 2018-06-24 DIAGNOSIS — O0990 Supervision of high risk pregnancy, unspecified, unspecified trimester: Secondary | ICD-10-CM

## 2018-06-24 NOTE — Telephone Encounter (Signed)
Patient walked in and wanted to speak to a nurse she had a appt in US and said she have a few questions to ask the nurse

## 2018-06-29 NOTE — Telephone Encounter (Signed)
Attempted to contact pt unable to LM due to message stating "person your trying to reach is unavailable please try call again".

## 2018-07-01 ENCOUNTER — Other Ambulatory Visit (HOSPITAL_COMMUNITY)
Admission: RE | Admit: 2018-07-01 | Discharge: 2018-07-01 | Disposition: A | Discharge status: Home / Self Care | Payer: Federal, State, Local not specified - PPO | Source: Ambulatory Visit | Attending: Obstetrics & Gynecology | Admitting: Obstetrics & Gynecology

## 2018-07-01 ENCOUNTER — Ambulatory Visit (INDEPENDENT_AMBULATORY_CARE_PROVIDER_SITE_OTHER): Payer: Federal, State, Local not specified - PPO | Admitting: Obstetrics & Gynecology

## 2018-07-01 ENCOUNTER — Other Ambulatory Visit (HOSPITAL_COMMUNITY): Payer: Self-pay | Admitting: Obstetrics & Gynecology

## 2018-07-01 VITALS — BP 133/78 | HR 90 | Wt 187.0 lb

## 2018-07-01 DIAGNOSIS — O99013 Anemia complicating pregnancy, third trimester: Secondary | ICD-10-CM

## 2018-07-01 DIAGNOSIS — O26893 Other specified pregnancy related conditions, third trimester: Secondary | ICD-10-CM | POA: Diagnosis not present

## 2018-07-01 DIAGNOSIS — O0990 Supervision of high risk pregnancy, unspecified, unspecified trimester: Secondary | ICD-10-CM

## 2018-07-01 DIAGNOSIS — N898 Other specified noninflammatory disorders of vagina: Secondary | ICD-10-CM | POA: Diagnosis not present

## 2018-07-01 NOTE — Progress Notes (Signed)
   PRENATAL VISIT NOTE  Subjective:  Judy Rodgers is a 22 y.o. G1P0000 at 2456w0d being seen today for ongoing prenatal care.  She is currently monitored for the following issues for this low-risk pregnancy and has Migraine without aura and without status migrainosus, not intractable; Acne; Constipation; Irregular menses; Anemia; Insomnia; Supervision of high risk pregnancy, antepartum; Uterine abnormality during pregnancy, antepartum; Beta thalassemia minor; Iron deficiency anemia due to chronic blood loss; and Anemia in pregnancy, third trimester on their problem list.  Patient reports no complaints.  Contractions: Irritability. Vag. Bleeding: None.  Movement: Present. Denies leaking of fluid.   The following portions of the patient's history were reviewed and updated as appropriate: allergies, current medications, past family history, past medical history, past social history, past surgical history and problem list. Problem list updated.  Objective:   Vitals:   07/01/18 1432  BP: 133/78  Pulse: 90  Weight: 187 lb (84.8 kg)    Fetal Status: Fetal Heart Rate (bpm): 156   Movement: Present     General:  Alert, oriented and cooperative. Patient is in no acute distress.  Skin: Skin is warm and dry. No rash noted.   Cardiovascular: Normal heart rate noted  Respiratory: Normal respiratory effort, no problems with respiration noted  Abdomen: Soft, gravid, appropriate for gestational age.  Pain/Pressure: Present     Pelvic: Cervical exam performed        Extremities: Normal range of motion.  Edema: Trace  Mental Status: Normal mood and affect. Normal behavior. Normal judgment and thought content.   Assessment and Plan:  Pregnancy: G1P0000 at 7656w0d  1. Anemia in pregnancy, third trimester  - GC[facility]Chlamydia probe amp [facility] - Culture, Grp B Strep w/Rflx Suscept  Preterm labor symptoms and general obstetric precautions including but not limited to vaginal bleeding,  contractions, leaking of fluid and fetal movement were reviewed in detail with the patient. Please refer to After Visit Summary for other counseling recommendations.  No follow-ups on file.  Future Appointments  Date Time Provider Department Center  07/07/2018  2:35 PM Hermina StaggersErvin, Michael L, [facility]  07/14/2018  1:45 PM [facility]-MFC US 2 WH-MFCUS MFC-US  07/14/2018  3:15 PM Conan Bowensavis, Kelly M, MD [facility]A WOC    Allie BossierMyra C Kida Digiulio, MD

## 2018-07-02 LAB — GC/CHLAMYDIA PROBE AMP (~~LOC~~) NOT AT ARMC
Chlamydia: NEGATIVE
NEISSERIA GONORRHEA: NEGATIVE

## 2018-07-04 ENCOUNTER — Encounter (HOSPITAL_COMMUNITY): Payer: Self-pay | Admitting: *Deleted

## 2018-07-04 ENCOUNTER — Inpatient Hospital Stay (HOSPITAL_BASED_OUTPATIENT_CLINIC_OR_DEPARTMENT_OTHER): Payer: Federal, State, Local not specified - PPO

## 2018-07-04 ENCOUNTER — Inpatient Hospital Stay (HOSPITAL_COMMUNITY)
Admission: AD | Admit: 2018-07-04 | Discharge: 2018-07-04 | Disposition: A | Discharge status: Home / Self Care | Payer: Federal, State, Local not specified - PPO | Source: Ambulatory Visit | Attending: Family Medicine | Admitting: Family Medicine

## 2018-07-04 DIAGNOSIS — O0993 Supervision of high risk pregnancy, unspecified, third trimester: Secondary | ICD-10-CM

## 2018-07-04 DIAGNOSIS — O34593 Maternal care for other abnormalities of gravid uterus, third trimester: Secondary | ICD-10-CM | POA: Diagnosis not present

## 2018-07-04 DIAGNOSIS — Z3A35 35 weeks gestation of pregnancy: Secondary | ICD-10-CM | POA: Diagnosis not present

## 2018-07-04 DIAGNOSIS — O0990 Supervision of high risk pregnancy, unspecified, unspecified trimester: Secondary | ICD-10-CM

## 2018-07-04 DIAGNOSIS — O26893 Other specified pregnancy related conditions, third trimester: Secondary | ICD-10-CM | POA: Insufficient documentation

## 2018-07-04 DIAGNOSIS — Z3A36 36 weeks gestation of pregnancy: Secondary | ICD-10-CM | POA: Insufficient documentation

## 2018-07-04 DIAGNOSIS — Z0371 Encounter for suspected problem with amniotic cavity and membrane ruled out: Secondary | ICD-10-CM

## 2018-07-04 DIAGNOSIS — O34599 Maternal care for other abnormalities of gravid uterus, unspecified trimester: Secondary | ICD-10-CM

## 2018-07-04 DIAGNOSIS — O289 Unspecified abnormal findings on antenatal screening of mother: Secondary | ICD-10-CM

## 2018-07-04 LAB — WET PREP, GENITAL
Clue Cells Wet Prep HPF POC: NONE SEEN
SPERM: NONE SEEN
TRICH WET PREP: NONE SEEN
YEAST WET PREP: NONE SEEN

## 2018-07-04 LAB — POCT FERN TEST: POCT Fern Test: NEGATIVE

## 2018-07-04 NOTE — MAU Provider Note (Signed)
S: [name] is a 22 y.o. G1P0000 at 6146w3d  who presents to MAU today complaining of leaking of fluid since 1900. She denies vaginal bleeding. She endorses contractions. She reports normal fetal movement.    O: BP 127/73   Pulse 65   Temp 98.1 F (36.7 C)   Resp 18   Ht 5\' 2"  (1.575 m)   Wt 87.1 kg   LMP 10/22/2017 (Exact Date)   BMI 35.12 kg/m  GENERAL: Well-developed, well-nourished female in no acute distress.  HEAD: Normocephalic, atraumatic.  CHEST: Normal effort of breathing, regular heart rate ABDOMEN: Soft, nontender, gravid PELVIC: Normal external female genitalia. Vagina is pink and rugated. Cervix with normal contour, no lesions. Normal discharge.  Negative pooling.   Cervical exam: 1/thick/ballotable    Fern: NEGATIVE   Fetal Monitoring: Baseline: 150 Variability: moderate Accelerations: 10x10 Decelerations: variable Contractions: none   BPP 8/8 NST since returning from BPP 150, moderate with 2 15x15 accels, no decels Toco: no UCs   A: SIUP at 7446w3d  Membranes intact  P: DC home   Comfort measures reviewed  3rd Trimester precautions  labor precautions  Fetal kick counts RX: none  Return to MAU as needed FU with OB as planned  Follow-up Information    [facility].   Specialty:  Obstetrics and Gynecology Contact information: [address] [place] [id number] [phone]           Armando ReichertHogan, Kamden Reber D, CNM 07/04/2018 2:05 AM

## 2018-07-04 NOTE — Discharge Instructions (Signed)
Vaginal delivery means that you will give birth by pushing your baby out of your birth canal (vagina). A team of health care providers will help you before, during, and after vaginal delivery. Birth experiences are unique for every woman and every pregnancy, and birth experiences vary depending on where you choose to give birth. What should I do to prepare for my baby's birth? Before your baby is born, it is important to talk with your health care provider about:  Your labor and delivery preferences. These may include: ? Medicines that you may be given. ? How you will manage your pain. This might include non-medical pain relief techniques or injectable pain relief such as epidural analgesia. ? How you and your baby will be monitored during labor and delivery. ? Who may be in the labor and delivery room with you. ? Your feelings about surgical delivery of your baby (cesarean delivery, or C-section) if this becomes necessary. ? Your feelings about receiving donated blood through an IV tube (blood transfusion) if this becomes necessary.  Whether you are able: ? To take pictures or videos of the birth. ? To eat during labor and delivery. ? To move around, walk, or change positions during labor and delivery.  What to expect after your baby is born, such as: ? Whether delayed umbilical cord clamping and cutting is offered. ? Who will care for your baby right after birth. ? Medicines or tests that may be recommended for your baby. ? Whether breastfeeding is supported in your hospital or birth center. ? How long you will be in the hospital or birth center.  How any medical conditions you have may affect your baby or your labor and delivery experience.  To prepare for your baby's birth, you should also:  Attend all of your health care visits before delivery (prenatal visits) as recommended by your health care provider. This is important.  Prepare your home for your baby's arrival. Make sure  that you have: ? Diapers. ? Baby clothing. ? Feeding equipment. ? Safe sleeping arrangements for you and your baby.  Install a car seat in your vehicle. Have your car seat checked by a certified car seat installer to make sure that it is installed safely.  Think about who will help you with your new baby at home for at least the first several weeks after delivery.  What can I expect when I arrive at the birth center or hospital? Once you are in labor and have been admitted into the hospital or birth center, your health care provider may:  Review your pregnancy history and any concerns you have.  Insert an IV tube into one of your veins. This is used to give you fluids and medicines.  Check your blood pressure, pulse, temperature, and heart rate (vital signs).  Check whether your bag of water (amniotic sac) has broken (ruptured).  Talk with you about your birth plan and discuss pain control options.  Monitoring Your health care provider may monitor your contractions (uterine monitoring) and your baby's heart rate (fetal monitoring). You may need to be monitored:  Often, but not continuously (intermittently).  All the time or for long periods at a time (continuously). Continuous monitoring may be needed if: ? You are taking certain medicines, such as medicine to relieve pain or make your contractions stronger. ? You have pregnancy or labor complications.  Monitoring may be done by:  Placing a special stethoscope or a handheld monitoring device on your abdomen to check your   baby's heartbeat, and feeling your abdomen for contractions. This method of monitoring does not continuously record your baby's heartbeat or your contractions.  Placing monitors on your abdomen (external monitors) to record your baby's heartbeat and the frequency and length of contractions. You may not have to wear external monitors all the time.  Placing monitors inside of your uterus (internal monitors) to  record your baby's heartbeat and the frequency, length, and strength of your contractions. ? Your health care provider may use internal monitors if he or she needs more information about the strength of your contractions or your baby's heart rate. ? Internal monitors are put in place by passing a thin, flexible wire through your vagina and into your uterus. Depending on the type of monitor, it may remain in your uterus or on your baby's head until birth. ? Your health care provider will discuss the benefits and risks of internal monitoring with you and will ask for your permission before inserting the monitors.  Telemetry. This is a type of continuous monitoring that can be done with external or internal monitors. Instead of having to stay in bed, you are able to move around during telemetry. Ask your health care provider if telemetry is an option for you.  Physical exam Your health care provider may perform a physical exam. This may include:  Checking whether your baby is positioned: ? With the head toward your vagina (head-down). This is most common. ? With the head toward the top of your uterus (head-up or breech). If your baby is in a breech position, your health care provider may try to turn your baby to a head-down position so you can deliver vaginally. If it does not seem that your baby can be born vaginally, your provider may recommend surgery to deliver your baby. In rare cases, you may be able to deliver vaginally if your baby is head-up (breech delivery). ? Lying sideways (transverse). Babies that are lying sideways cannot be delivered vaginally.  Checking your cervix to determine: ? Whether it is thinning out (effacing). ? Whether it is opening up (dilating). ? How low your baby has moved into your birth canal.  What are the three stages of labor and delivery?  Normal labor and delivery is divided into the following three stages: Stage 1  Stage 1 is the longest stage of labor,  and it can last for hours or days. Stage 1 includes: ? Early labor. This is when contractions may be irregular, or regular and mild. Generally, early labor contractions are more than 10 minutes apart. ? Active labor. This is when contractions get longer, more regular, more frequent, and more intense. ? The transition phase. This is when contractions happen very close together, are very intense, and may last longer than during any other part of labor.  Contractions generally feel mild, infrequent, and irregular at first. They get stronger, more frequent (about every 2-3 minutes), and more regular as you progress from early labor through active labor and transition.  Many women progress through stage 1 naturally, but you may need help to continue making progress. If this happens, your health care provider may talk with you about: ? Rupturing your amniotic sac if it has not ruptured yet. ? Giving you medicine to help make your contractions stronger and more frequent.  Stage 1 ends when your cervix is completely dilated to 4 inches (10 cm) and completely effaced. This happens at the end of the transition phase. Stage 2  Once your cervix   is completely effaced and dilated to 4 inches (10 cm), you may start to feel an urge to push. It is common for the body to naturally take a rest before feeling the urge to push, especially if you received an epidural or certain other pain medicines. This rest period may last for up to 1-2 hours, depending on your unique labor experience.  During stage 2, contractions are generally less painful, because pushing helps relieve contraction pain. Instead of contraction pain, you may feel stretching and burning pain, especially when the widest part of your baby's head passes through the vaginal opening (crowning).  Your health care provider will closely monitor your pushing progress and your baby's progress through the vagina during stage 2.  Your health care provider may  massage the area of skin between your vaginal opening and anus (perineum) or apply warm compresses to your perineum. This helps it stretch as the baby's head starts to crown, which can help prevent perineal tearing. ? In some cases, an incision may be made in your perineum (episiotomy) to allow the baby to pass through the vaginal opening. An episiotomy helps to make the opening of the vagina larger to allow more room for the baby to fit through.  It is very important to breathe and focus so your health care provider can control the delivery of your baby's head. Your health care provider may have you decrease the intensity of your pushing, to help prevent perineal tearing.  After delivery of your baby's head, the shoulders and the rest of the body generally deliver very quickly and without difficulty.  Once your baby is delivered, the umbilical cord may be cut right away, or this may be delayed for 1-2 minutes, depending on your baby's health. This may vary among health care providers, hospitals, and birth centers.  If you and your baby are healthy enough, your baby may be placed on your chest or abdomen to help maintain the baby's temperature and to help you bond with each other. Some mothers and babies start breastfeeding at this time. Your health care team will dry your baby and help keep your baby warm during this time.  Your baby may need immediate care if he or she: ? Showed signs of distress during labor. ? Has a medical condition. ? Was born too early (prematurely). ? Had a bowel movement before birth (meconium). ? Shows signs of difficulty transitioning from being inside the uterus to being outside of the uterus. If you are planning to breastfeed, your health care team will help you begin a feeding. Stage 3  The third stage of labor starts immediately after the birth of your baby and ends after you deliver the placenta. The placenta is an organ that develops during pregnancy to provide  oxygen and nutrients to your baby in the womb.  Delivering the placenta may require some pushing, and you may have mild contractions. Breastfeeding can stimulate contractions to help you deliver the placenta.  After the placenta is delivered, your uterus should tighten (contract) and become firm. This helps to stop bleeding in your uterus. To help your uterus contract and to control bleeding, your health care provider may: ? Give you medicine by injection, through an IV tube, by mouth, or through your rectum (rectally). ? Massage your abdomen or perform a vaginal exam to remove any blood clots that are left in your uterus. ? Empty your bladder by placing a thin, flexible tube (catheter) into your bladder. ? Encourage you to   breastfeed your baby. After labor is over, you and your baby will be monitored closely to ensure that you are both healthy until you are ready to go home. Your health care team will teach you how to care for yourself and your baby. This information is not intended to replace advice given to you by your health care provider. Make sure you discuss any questions you have with your health care provider. Document Released: 08/12/2008 Document Revised: 05/23/2016 Document Reviewed: 11/18/2015 Elsevier Interactive Patient Education  2018 Elsevier Inc.  

## 2018-07-04 NOTE — MAU Note (Signed)
About 1930 leaked some fld. Started having ctxs about later. Have not leaked anymore fld since then. Ctxs about apart. Diarrhea for a week that has been more watery last few days. No vag bleeding

## 2018-07-07 ENCOUNTER — Ambulatory Visit (INDEPENDENT_AMBULATORY_CARE_PROVIDER_SITE_OTHER): Payer: Federal, State, Local not specified - PPO | Admitting: Obstetrics and Gynecology

## 2018-07-07 ENCOUNTER — Encounter: Payer: Self-pay | Admitting: Obstetrics and Gynecology

## 2018-07-07 VITALS — BP 124/85 | HR 100 | Wt 185.4 lb

## 2018-07-07 DIAGNOSIS — D563 Thalassemia minor: Secondary | ICD-10-CM

## 2018-07-07 DIAGNOSIS — O34599 Maternal care for other abnormalities of gravid uterus, unspecified trimester: Secondary | ICD-10-CM

## 2018-07-07 DIAGNOSIS — O0990 Supervision of high risk pregnancy, unspecified, unspecified trimester: Secondary | ICD-10-CM

## 2018-07-07 NOTE — Progress Notes (Signed)
Subjective:  Judy Rodgers is a 22 y.o. G1P0000 at 2255w6d being seen today for ongoing prenatal care.  She is currently monitored for the following issues for this high-risk pregnancy and has Migraine without aura and without status migrainosus, not intractable; Acne; Constipation; Irregular menses; Anemia; Insomnia; Supervision of high risk pregnancy, antepartum; Uterine abnormality during pregnancy, antepartum; Beta thalassemia minor; Iron deficiency anemia due to chronic blood loss; and Anemia in pregnancy, third trimester on their problem list.  Patient reports general discomforts of pregnancy.  Contractions: Irritability. Vag. Bleeding: None.  Movement: Present. Denies leaking of fluid.   The following portions of the patient's history were reviewed and updated as appropriate: allergies, current medications, past family history, past medical history, past social history, past surgical history and problem list. Problem list updated.  Objective:   Vitals:   07/07/18 1519  BP: 124/85  Pulse: 100  Weight: 185 lb 6.4 oz (84.1 kg)    Fetal Status: Fetal Heart Rate (bpm): 152   Movement: Present     General:  Alert, oriented and cooperative. Patient is in no acute distress.  Skin: Skin is warm and dry. No rash noted.   Cardiovascular: Normal heart rate noted  Respiratory: Normal respiratory effort, no problems with respiration noted  Abdomen: Soft, gravid, appropriate for gestational age. Pain/Pressure: Present     Pelvic:  Cervical exam deferred        Extremities: Normal range of motion.  Edema: Trace  Mental Status: Normal mood and affect. Normal behavior. Normal judgment and thought content.   Urinalysis:      Assessment and Plan:  Pregnancy: G1P0000 at 2255w6d  1. Supervision of high risk pregnancy, antepartum Stable Labor precautions  2. Uterine abnormality during pregnancy, antepartum Nl Growth on 06/24/18 follow up ordered  3. Beta thalassemia minor See above  Preterm  labor symptoms and general obstetric precautions including but not limited to vaginal bleeding, contractions, leaking of fluid and fetal movement were reviewed in detail with the patient. Please refer to After Visit Summary for other counseling recommendations.  Return in about 1 week (around 07/14/2018) for OB visit.   Hermina StaggersErvin, Michael L, MD

## 2018-07-08 LAB — STREP GP B NAA+RFLX: Strep Gp B NAA+Rflx: POSITIVE — AB

## 2018-07-08 LAB — STREP GP B SUSCEPTIBILITY

## 2018-07-09 ENCOUNTER — Encounter: Payer: Self-pay | Admitting: Obstetrics & Gynecology

## 2018-07-09 DIAGNOSIS — O99820 Streptococcus B carrier state complicating pregnancy: Secondary | ICD-10-CM | POA: Insufficient documentation

## 2018-07-14 ENCOUNTER — Ambulatory Visit (INDEPENDENT_AMBULATORY_CARE_PROVIDER_SITE_OTHER): Payer: Federal, State, Local not specified - PPO | Admitting: Obstetrics and Gynecology

## 2018-07-14 ENCOUNTER — Other Ambulatory Visit (HOSPITAL_COMMUNITY): Payer: Self-pay | Admitting: Obstetrics and Gynecology

## 2018-07-14 ENCOUNTER — Encounter: Payer: Self-pay | Admitting: Obstetrics and Gynecology

## 2018-07-14 ENCOUNTER — Ambulatory Visit (HOSPITAL_COMMUNITY)
Admission: RE | Admit: 2018-07-14 | Discharge: 2018-07-14 | Disposition: A | Discharge status: Home / Self Care | Payer: Federal, State, Local not specified - PPO | Source: Ambulatory Visit | Attending: Obstetrics and Gynecology | Admitting: Obstetrics and Gynecology

## 2018-07-14 ENCOUNTER — Encounter (HOSPITAL_COMMUNITY): Payer: Self-pay

## 2018-07-14 VITALS — BP 136/83 | HR 65 | Wt 184.0 lb

## 2018-07-14 DIAGNOSIS — Z362 Encounter for other antenatal screening follow-up: Secondary | ICD-10-CM | POA: Insufficient documentation

## 2018-07-14 DIAGNOSIS — O0990 Supervision of high risk pregnancy, unspecified, unspecified trimester: Secondary | ICD-10-CM

## 2018-07-14 DIAGNOSIS — O99013 Anemia complicating pregnancy, third trimester: Secondary | ICD-10-CM

## 2018-07-14 DIAGNOSIS — O283 Abnormal ultrasonic finding on antenatal screening of mother: Secondary | ICD-10-CM

## 2018-07-14 DIAGNOSIS — O365990 Maternal care for other known or suspected poor fetal growth, unspecified trimester, not applicable or unspecified: Secondary | ICD-10-CM

## 2018-07-14 DIAGNOSIS — O99820 Streptococcus B carrier state complicating pregnancy: Secondary | ICD-10-CM

## 2018-07-14 DIAGNOSIS — Z3A37 37 weeks gestation of pregnancy: Secondary | ICD-10-CM | POA: Insufficient documentation

## 2018-07-14 DIAGNOSIS — O34593 Maternal care for other abnormalities of gravid uterus, third trimester: Secondary | ICD-10-CM

## 2018-07-14 DIAGNOSIS — O3403 Maternal care for unspecified congenital malformation of uterus, third trimester: Secondary | ICD-10-CM | POA: Diagnosis not present

## 2018-07-14 DIAGNOSIS — Q513 Bicornate uterus: Secondary | ICD-10-CM | POA: Diagnosis not present

## 2018-07-14 DIAGNOSIS — D569 Thalassemia, unspecified: Secondary | ICD-10-CM | POA: Diagnosis not present

## 2018-07-14 DIAGNOSIS — O34599 Maternal care for other abnormalities of gravid uterus, unspecified trimester: Secondary | ICD-10-CM

## 2018-07-14 DIAGNOSIS — O365930 Maternal care for other known or suspected poor fetal growth, third trimester, not applicable or unspecified: Secondary | ICD-10-CM | POA: Diagnosis not present

## 2018-07-14 NOTE — Progress Notes (Signed)
   PRENATAL VISIT NOTE  Subjective:  Judy Rodgers is a 22 y.o. G1P0000 at 7028w6d being seen today for ongoing prenatal care.  She is currently monitored for the following issues for this high-risk pregnancy and has Migraine without aura and without status migrainosus, not intractable; Acne; Constipation; Irregular menses; Anemia; Insomnia; Supervision of high risk pregnancy, antepartum; Uterine abnormality during pregnancy, antepartum; Beta thalassemia minor; Iron deficiency anemia due to chronic blood loss; Anemia in pregnancy, third trimester; and GBS (group B Streptococcus carrier), +RV culture, currently pregnant on their problem list.  Patient reports no complaints.  Contractions: Irritability. Vag. Bleeding: None.  Movement: Present. Denies leaking of fluid.   The following portions of the patient's history were reviewed and updated as appropriate: allergies, current medications, past family history, past medical history, past social history, past surgical history and problem list. Problem list updated.  Objective:   Vitals:   07/14/18 1542  BP: 136/83  Pulse: 65  Weight: 184 lb (83.5 kg)    Fetal Status: Fetal Heart Rate (bpm): 142   Movement: Present     General:  Alert, oriented and cooperative. Patient is in no acute distress.  Skin: Skin is warm and dry. No rash noted.   Cardiovascular: Normal heart rate noted  Respiratory: Normal respiratory effort, no problems with respiration noted  Abdomen: Soft, gravid, appropriate for gestational age.  Pain/Pressure: Present     Pelvic: Cervical exam deferred        Extremities: Normal range of motion.  Edema: Trace  Mental Status: Normal mood and affect. Normal behavior. Normal judgment and thought content.   Assessment and Plan:  Pregnancy: G1P0000 at 7728w6d  1. Supervision of high risk pregnancy, antepartum Reviewed IOL, answered all questions  2. GBS (group B Streptococcus carrier), +RV culture, currently pregnant ppx in  labor  3. Uterine abnormality during pregnancy, antepartum Exam pp  4. Poor fetal growth affecting management of mother, antepartum, single or unspecified fetus Having dopplers, last one today, no evidence of fetal compromise today Has induction scheduled for 07/16/18    Term labor symptoms and general obstetric precautions including but not limited to vaginal bleeding, contractions, leaking of fluid and fetal movement were reviewed in detail with the patient. Please refer to After Visit Summary for other counseling recommendations.  Return in about 5 weeks (around 08/18/2018) for post partum check.  Future Appointments  Date Time Provider Department Center  07/16/2018  6:30 AM [facility] RO[facility] None    Conan BowensKelly M , MD

## 2018-07-15 ENCOUNTER — Encounter (HOSPITAL_COMMUNITY): Payer: Self-pay | Admitting: *Deleted

## 2018-07-15 ENCOUNTER — Telehealth (HOSPITAL_COMMUNITY): Payer: Self-pay | Admitting: *Deleted

## 2018-07-15 NOTE — Telephone Encounter (Signed)
Preadmission screen  

## 2018-07-16 ENCOUNTER — Encounter (HOSPITAL_COMMUNITY): Payer: Self-pay

## 2018-07-16 ENCOUNTER — Inpatient Hospital Stay (HOSPITAL_COMMUNITY)
Admission: RE | Admit: 2018-07-16 | Discharge: 2018-07-19 | DRG: 807 | Disposition: A | Discharge status: Home / Self Care | Payer: Federal, State, Local not specified - PPO | Attending: Obstetrics and Gynecology | Admitting: Obstetrics and Gynecology

## 2018-07-16 ENCOUNTER — Inpatient Hospital Stay (HOSPITAL_COMMUNITY): Payer: Federal, State, Local not specified - PPO | Admitting: Anesthesiology

## 2018-07-16 ENCOUNTER — Other Ambulatory Visit: Payer: Self-pay

## 2018-07-16 DIAGNOSIS — O365930 Maternal care for other known or suspected poor fetal growth, third trimester, not applicable or unspecified: Principal | ICD-10-CM | POA: Diagnosis present

## 2018-07-16 DIAGNOSIS — O9902 Anemia complicating childbirth: Secondary | ICD-10-CM | POA: Diagnosis not present

## 2018-07-16 DIAGNOSIS — D500 Iron deficiency anemia secondary to blood loss (chronic): Secondary | ICD-10-CM | POA: Diagnosis present

## 2018-07-16 DIAGNOSIS — O99824 Streptococcus B carrier state complicating childbirth: Secondary | ICD-10-CM | POA: Diagnosis not present

## 2018-07-16 DIAGNOSIS — O34599 Maternal care for other abnormalities of gravid uterus, unspecified trimester: Secondary | ICD-10-CM

## 2018-07-16 DIAGNOSIS — Z880 Allergy status to penicillin: Secondary | ICD-10-CM

## 2018-07-16 DIAGNOSIS — O99820 Streptococcus B carrier state complicating pregnancy: Secondary | ICD-10-CM

## 2018-07-16 DIAGNOSIS — Z3A38 38 weeks gestation of pregnancy: Secondary | ICD-10-CM | POA: Diagnosis not present

## 2018-07-16 DIAGNOSIS — O0990 Supervision of high risk pregnancy, unspecified, unspecified trimester: Secondary | ICD-10-CM

## 2018-07-16 DIAGNOSIS — Q513 Bicornate uterus: Secondary | ICD-10-CM

## 2018-07-16 DIAGNOSIS — O365990 Maternal care for other known or suspected poor fetal growth, unspecified trimester, not applicable or unspecified: Secondary | ICD-10-CM | POA: Diagnosis present

## 2018-07-16 DIAGNOSIS — O3403 Maternal care for unspecified congenital malformation of uterus, third trimester: Secondary | ICD-10-CM | POA: Diagnosis not present

## 2018-07-16 LAB — COMPREHENSIVE METABOLIC PANEL
ALT: 8 U/L (ref 0–44)
AST: 14 U/L — ABNORMAL LOW (ref 15–41)
Albumin: 3.3 g/dL — ABNORMAL LOW (ref 3.5–5.0)
Alkaline Phosphatase: 166 U/L — ABNORMAL HIGH (ref 38–126)
Anion gap: 9 (ref 5–15)
BILIRUBIN TOTAL: 0.6 mg/dL (ref 0.3–1.2)
BUN: 9 mg/dL (ref 6–20)
CHLORIDE: 106 mmol/L (ref 98–111)
CO2: 19 mmol/L — ABNORMAL LOW (ref 22–32)
CREATININE: 0.86 mg/dL (ref 0.44–1.00)
Calcium: 8.9 mg/dL (ref 8.9–10.3)
Glucose, Bld: 76 mg/dL (ref 70–99)
Potassium: 4 mmol/L (ref 3.5–5.1)
Sodium: 134 mmol/L — ABNORMAL LOW (ref 135–145)
Total Protein: 6.9 g/dL (ref 6.5–8.1)

## 2018-07-16 LAB — CBC
HCT: 36.2 % (ref 36.0–46.0)
HEMATOCRIT: 35 % — AB (ref 36.0–46.0)
Hemoglobin: 11.5 g/dL — ABNORMAL LOW (ref 12.0–15.0)
Hemoglobin: 11.8 g/dL — ABNORMAL LOW (ref 12.0–15.0)
MCH: 26.7 pg (ref 26.0–34.0)
MCH: 26.7 pg (ref 26.0–34.0)
MCHC: 32.9 g/dL (ref 30.0–36.0)
MCHC: 32.6 g/dL (ref 30.0–36.0)
MCV: 81.4 fL (ref 78.0–100.0)
MCV: 81.9 fL (ref 78.0–100.0)
PLATELETS: 143 10*3/uL — AB (ref 150–400)
Platelets: 140 10*3/uL — ABNORMAL LOW (ref 150–400)
RBC: 4.3 MIL/uL (ref 3.87–5.11)
RBC: 4.42 MIL/uL (ref 3.87–5.11)
RDW: 15.4 % (ref 11.5–15.5)
RDW: 15.6 % — ABNORMAL HIGH (ref 11.5–15.5)
WBC: 9.7 10*3/uL (ref 4.0–10.5)
WBC: 9 10*3/uL (ref 4.0–10.5)

## 2018-07-16 LAB — PROTEIN / CREATININE RATIO, URINE
Creatinine, Urine: 172 mg/dL
PROTEIN CREATININE RATIO: 0.24 mg/mg{creat} — AB (ref 0.00–0.15)
TOTAL PROTEIN, URINE: 42 mg/dL

## 2018-07-16 LAB — TYPE AND SCREEN
ABO/RH(D): B POS
ANTIBODY SCREEN: NEGATIVE

## 2018-07-16 LAB — RPR: RPR Ser Ql: NONREACTIVE

## 2018-07-16 MED ORDER — OXYTOCIN 40 UNITS IN LACTATED RINGERS INFUSION - SIMPLE MED: 2.5000 [IU]/h | INTRAVENOUS | Status: DC

## 2018-07-16 MED ORDER — MISOPROSTOL 50MCG HALF TABLET
50.0000 ug | ORAL_TABLET | ORAL | Status: DC | PRN
  Administered 2018-07-16: 50 ug via ORAL
  Filled 2018-07-16: qty 1

## 2018-07-16 MED ORDER — SOD CITRATE-CITRIC ACID 500-334 MG/5ML PO SOLN: 30.0000 mL | ORAL | Status: DC | PRN

## 2018-07-16 MED ORDER — ONDANSETRON HCL 4 MG/2ML IJ SOLN
4.0000 mg | Freq: Four times a day (QID) | INTRAMUSCULAR | Status: DC | PRN
  Administered 2018-07-16 – 2018-07-17 (×2): 4 mg via INTRAVENOUS
  Filled 2018-07-16 (×2): qty 2

## 2018-07-16 MED ORDER — FENTANYL CITRATE (PF) 100 MCG/2ML IJ SOLN
100.0000 ug | INTRAMUSCULAR | Status: DC | PRN
  Administered 2018-07-16 (×2): 100 ug via INTRAVENOUS
  Filled 2018-07-16: qty 2

## 2018-07-16 MED ORDER — PHENYLEPHRINE 40 MCG/ML (10ML) SYRINGE FOR IV PUSH (FOR BLOOD PRESSURE SUPPORT)
80.0000 ug | PREFILLED_SYRINGE | INTRAVENOUS | Status: DC | PRN
  Filled 2018-07-16: qty 5
  Filled 2018-07-16: qty 10

## 2018-07-16 MED ORDER — FENTANYL 2.5 MCG/ML BUPIVACAINE 1/10 % EPIDURAL INFUSION (WH - ANES)
14.0000 mL/h | INTRAMUSCULAR | Status: DC | PRN
  Administered 2018-07-16 – 2018-07-17 (×2): 14 mL/h via EPIDURAL
  Filled 2018-07-16 (×2): qty 100

## 2018-07-16 MED ORDER — LACTATED RINGERS IV SOLN
500.0000 mL | Freq: Once | INTRAVENOUS | Status: AC
  Administered 2018-07-16: 500 mL via INTRAVENOUS

## 2018-07-16 MED ORDER — LIDOCAINE HCL (PF) 1 % IJ SOLN
30.0000 mL | INTRAMUSCULAR | Status: DC | PRN
  Filled 2018-07-16: qty 30

## 2018-07-16 MED ORDER — TERBUTALINE SULFATE 1 MG/ML IJ SOLN
0.2500 mg | Freq: Once | INTRAMUSCULAR | Status: DC | PRN
  Filled 2018-07-16: qty 1

## 2018-07-16 MED ORDER — EPHEDRINE 5 MG/ML INJ: 10.0000 mg | INTRAVENOUS | Status: DC | PRN

## 2018-07-16 MED ORDER — LACTATED RINGERS IV SOLN
INTRAVENOUS | Status: DC
  Administered 2018-07-16 – 2018-07-17 (×4): via INTRAVENOUS

## 2018-07-16 MED ORDER — LACTATED RINGERS IV SOLN: 500.0000 mL | Freq: Once | INTRAVENOUS | Status: DC

## 2018-07-16 MED ORDER — OXYCODONE-ACETAMINOPHEN 5-325 MG PO TABS: 2.0000 | ORAL_TABLET | ORAL | Status: DC | PRN

## 2018-07-16 MED ORDER — MISOPROSTOL 25 MCG QUARTER TABLET: 25.0000 ug | ORAL_TABLET | ORAL | Status: DC | PRN

## 2018-07-16 MED ORDER — LACTATED RINGERS IV SOLN
500.0000 mL | INTRAVENOUS | Status: DC | PRN
  Administered 2018-07-16 – 2018-07-17 (×2): 500 mL via INTRAVENOUS

## 2018-07-16 MED ORDER — DIPHENHYDRAMINE HCL 50 MG/ML IJ SOLN: 12.5000 mg | INTRAMUSCULAR | Status: DC | PRN

## 2018-07-16 MED ORDER — VANCOMYCIN HCL IN DEXTROSE 1-5 GM/200ML-% IV SOLN
1000.0000 mg | Freq: Two times a day (BID) | INTRAVENOUS | Status: DC
  Administered 2018-07-16 (×2): 1000 mg via INTRAVENOUS
  Filled 2018-07-16 (×3): qty 200

## 2018-07-16 MED ORDER — EPHEDRINE 5 MG/ML INJ
10.0000 mg | INTRAVENOUS | Status: DC | PRN
  Filled 2018-07-16: qty 2

## 2018-07-16 MED ORDER — PHENYLEPHRINE 40 MCG/ML (10ML) SYRINGE FOR IV PUSH (FOR BLOOD PRESSURE SUPPORT): 80.0000 ug | PREFILLED_SYRINGE | INTRAVENOUS | Status: DC | PRN

## 2018-07-16 MED ORDER — OXYCODONE-ACETAMINOPHEN 5-325 MG PO TABS: 1.0000 | ORAL_TABLET | ORAL | Status: DC | PRN

## 2018-07-16 MED ORDER — FENTANYL CITRATE (PF) 100 MCG/2ML IJ SOLN
INTRAMUSCULAR | Status: AC
  Administered 2018-07-16: 100 ug via INTRAVENOUS
  Filled 2018-07-16: qty 2

## 2018-07-16 MED ORDER — OXYTOCIN 40 UNITS IN LACTATED RINGERS INFUSION - SIMPLE MED: 1.0000 m[IU]/min | INTRAVENOUS | Status: DC

## 2018-07-16 MED ORDER — OXYTOCIN 40 UNITS IN LACTATED RINGERS INFUSION - SIMPLE MED
1.0000 m[IU]/min | INTRAVENOUS | Status: DC
  Administered 2018-07-16: 1 m[IU]/min via INTRAVENOUS
  Filled 2018-07-16: qty 1000

## 2018-07-16 MED ORDER — OXYTOCIN BOLUS FROM INFUSION
500.0000 mL | Freq: Once | INTRAVENOUS | Status: AC
  Administered 2018-07-17: 500 mL via INTRAVENOUS

## 2018-07-16 MED ORDER — PHENYLEPHRINE 40 MCG/ML (10ML) SYRINGE FOR IV PUSH (FOR BLOOD PRESSURE SUPPORT)
80.0000 ug | PREFILLED_SYRINGE | INTRAVENOUS | Status: DC | PRN
  Filled 2018-07-16: qty 5

## 2018-07-16 MED ORDER — ACETAMINOPHEN 325 MG PO TABS
650.0000 mg | ORAL_TABLET | ORAL | Status: DC | PRN
  Administered 2018-07-17: 650 mg via ORAL
  Filled 2018-07-16: qty 2

## 2018-07-16 MED ORDER — LIDOCAINE HCL (PF) 1 % IJ SOLN
INTRAMUSCULAR | Status: DC | PRN
  Administered 2018-07-16: 5 mL via EPIDURAL

## 2018-07-16 NOTE — Anesthesia Pain Management Evaluation Note (Signed)
  [facility] Pain Management Visit Note  Patient: Judy Rodgers, 22 y.o., female  "Hello I am a member of the anesthesia team at [facility]. We have an anesthesia team available at all times to provide care throughout the hospital, including epidural management and anesthesia for C-section. I don't know your plan for the delivery whether it a natural birth, water birth, IV sedation, nitrous supplementation, doula or epidural, but we want to meet your pain goals."   1.Was your pain managed to your expectations on prior hospitalizations?   No prior hospitalizations  2.What is your expectation for pain management during this hospitalization?     Epidural and IV pain meds  3.How can we help you reach that goal? Support PRN  Record the patient's initial score and the patient's pain goal.   Pain: 0  Pain Goal: 6/8  before epidural   [facility] wants you to be able to say your pain was always managed very well.  Judy Rodgers 07/16/2018

## 2018-07-16 NOTE — Progress Notes (Signed)
OB/GYN Faculty Practice: Labor Progress Note  Subjective: Doing well. Partner and family/friends in room. Feeling contractions.  Objective: BP 111/64   Pulse 80   Temp 98.5 F (36.9 C) (Oral)   Resp 16   Ht 5' 2.5" (1.588 m)   Wt 85 kg   LMP 10/22/2017 (Exact Date)   BMI 33.73 kg/m  Gen: well-appearing, NAD Dilation: 1.5 Effacement (%): 70, 80 Cervical Position: Posterior Station: -2 Presentation: Vertex Exam by:: Lorn Junes. Goodman, RN  Assessment and Plan: [age] y.o. G1P0000 7639w1d here for IOL for IUGR.  Induction of Labor IUGR: s/p cytotec -- FB placed 1500 -- will start low-dose pitocin while FB still in place, 1 x1  -- pain control: planning for epidural   Fetal Well-Being: EFW 2335g, <10% at 37w5. Cephalic by sutures.  -- Category I - continuous fetal monitoring  -- GBS (+)   Shemika Robbs S. Earlene PlaterWallace, DO OB/GYN Fellow, Faculty Practice  2:58 PM

## 2018-07-16 NOTE — Anesthesia Preprocedure Evaluation (Signed)
Anesthesia Evaluation  Patient identified by MRN, date of birth, ID band Patient awake    Reviewed: Allergy & Precautions, NPO status , Patient's Chart, lab work & pertinent test results  Airway Mallampati: II  TM Distance: >3 FB Neck ROM: Full    Dental no notable dental hx. (+) Teeth Intact   Pulmonary neg pulmonary ROS,    Pulmonary exam normal breath sounds clear to auscultation       Cardiovascular Exercise Tolerance: Good hypertension, Normal cardiovascular exam Rhythm:Regular Rate:Normal     Neuro/Psych  Headaches, negative psych ROS   GI/Hepatic   Endo/Other    Renal/GU      Musculoskeletal   Abdominal   Peds  Hematology  (+) anemia ,   Anesthesia Other Findings Hx Of IUGR w this pregnancy and Bicornate uterus  Reproductive/Obstetrics (+) Pregnancy                             Lab Results  Component Value Date   WBC 9.0 07/16/2018   HGB 11.8 (L) 07/16/2018   HCT 36.2 07/16/2018   MCV 81.9 07/16/2018   PLT 143 (L) 07/16/2018    Anesthesia Physical Anesthesia Plan  ASA: III  Anesthesia Plan: Epidural   Post-op Pain Management:    Induction:   PONV Risk Score and Plan:   Airway Management Planned:   Additional Equipment:   Intra-op Plan:   Post-operative Plan:   Informed Consent:   Plan Discussed with:   Anesthesia Plan Comments: (Pt w HTN and Plt 143 will recheck plts prior to d/c epidural dw nursing)        Anesthesia Quick Evaluation

## 2018-07-16 NOTE — Progress Notes (Signed)
OB/GYN Faculty Practice: Labor Progress Note  Subjective: Patient doing well sitting comfortably. Reports that she feels strong contractions and got one dose of IV pain medications and would like another. Says that she eventually wants an epidural but not yet.   Objective: BP 133/77   Pulse 60   Temp 98.6 F (37 C) (Oral)   Resp 18   Ht 5' 2.5" (1.588 m)   Wt 85 kg   LMP 10/22/2017 (Exact Date)   BMI 33.73 kg/m  Gen: Alert, NAD  Dilation: 5 Effacement (%): 70 Cervical Position: Posterior Station: -2 Presentation: Vertex Exam by:: Lorn Junes. Goodman, RN  Assessment and Plan: 22 y.o. G1P0000 4257w1d here for IOL for IUGR.  Labor: Induction going well  -- FB out at 1630  -- s/p cytotec -- Pitocin running at this time  -- pain control: IV pain medications- eventual epidural   Fetal Well-Being: EFW 2335g, <10% at 37w5. Cephalic by sutures.  -- Category I - continuous fetal monitoring  -- GBS (+)    Charna ElizabethJohn Victor Nelly Scriven MS4 [facility]  7:20 PM

## 2018-07-16 NOTE — Anesthesia Procedure Notes (Deleted)
Anesthesia Regional Block: Narrative:       

## 2018-07-16 NOTE — Progress Notes (Signed)
Discussed plan of care with nurse, Dahlia ClientHannah. Patient has received epidural and much more comfortable. Last SVE 6/80/-2. Pit 8. Will change to 2x2 now that more active and progressing. UPC came back 0.24, BP has been moderate range. Will consider AROM once more active.

## 2018-07-16 NOTE — Anesthesia Procedure Notes (Signed)
Epidural Patient location during procedure: OB Start time: 07/16/2018 9:01 PM End time: 07/16/2018 9:25 PM  Staffing Anesthesiologist: Trevor IhaHouser, Stephen A, MD Performed: anesthesiologist   Preanesthetic Checklist Completed: patient identified, site marked, surgical consent, pre-op evaluation, timeout performed, IV checked, risks and benefits discussed and monitors and equipment checked  Epidural Patient position: sitting Prep: site prepped and draped and DuraPrep Patient monitoring: continuous pulse ox and blood pressure Approach: midline Location: L3-L4 Injection technique: LOR air  Needle:  Needle type: Tuohy  Needle gauge: 17 G Needle length: 9 cm and 9 Needle insertion depth: 7 cm Catheter type: closed end flexible Catheter size: 19 Gauge Catheter at skin depth: 13 cm Test dose: negative  Assessment Events: blood not aspirated, injection not painful, no injection resistance, negative IV test and no paresthesia  Additional Notes 2 attempts pt tolerated procedure well

## 2018-07-16 NOTE — H&P (Addendum)
OBSTETRIC ADMISSION HISTORY AND PHYSICAL  Judy Rodgers is a 22 y.o. female G1P0000 with IUP at [redacted]w[redacted]d by LMP presenting for IOL due to IUGR (10.9%) . Patient reports no other issues this pregnancy other than that she has a bicornate uterus. Has no complaints at this time.   Admits to HA relieved by medications, intermittent diarrhea/constipation for the past 2 weeks, nausea and vomiting throughout pregnancy which patient reports has gotten worse over the last week (vommited 3-4 times). Denies changes in vision, spots in vision, SOB, CP, hematuria, dysuria.   Reports fetal movement. Denies vaginal bleeding, LOF, contractions.   She received her prenatal care at [facility].  Support: Ramon Dredge FOB  Ultrasounds . Anatomy U/S: 07/14/18 Comments  U/S images reviewed. Findings reviewed with patient.   Fetal  growth is at the 10.9 %.  No fetal abnormalities are seen.  No  evidence of fetal compromise is found on BPP today.  Elevated UA dopplers are interrogated.  Case discussed with  the OB on call.  Questions answered.  10 minutes spent face to face.  Recommendations: 1) Delivery between 38-39 weeks 2)  Follow-up BPP and UA dopplers on 07/19/18 if undelivered  Prenatal History/Complications: Marland Kitchen Down trending fetal growth curve.  . GBS +   Past Medical History: Past Medical History:  Diagnosis Date  . Acne   . Bicornuate uterus   . Environmental allergies   . Headache(784.0)    chronic ha takes motrin 800mg  PRN    Past Surgical History: Past Surgical History:  Procedure Laterality Date  . TONSILLECTOMY    . TONSILLECTOMY    . WISDOM TOOTH EXTRACTION  09/2015    Obstetrical History: OB History    Gravida  1   Para  0   Term  0   Preterm  0   AB  0   Living  0     SAB  0   TAB  0   Ectopic  0   Multiple  0   Live Births  0           Social History: Social History   Socioeconomic History  . Marital status: Single    Spouse name: Not on file  . Number of  children: Not on file  . Years of education: Not on file  . Highest education level: Not on file  Occupational History  . Not on file  Social Needs  . Financial resource strain: Not hard at all  . Food insecurity:    Worry: Never true    Inability: Never true  . Transportation needs:    Medical: No    Non-medical: No  Tobacco Use  . Smoking status: Never Smoker  . Smokeless tobacco: Never Used  Substance and Sexual Activity  . Alcohol use: No  . Drug use: No    Types: Other-see comments    Comment: marijuana - maybe in early pregnancy  . Sexual activity: Yes    Partners: Male    Birth control/protection: None  Lifestyle  . Physical activity:    Days per week: 0 days    Minutes per session: 0 min  . Stress: Not on file  Relationships  . Social connections:    Talks on phone: Not on file    Gets together: Not on file    Attends religious service: Not on file    Active member of club or organization: Not on file    Attends meetings of clubs or organizations: Not on file  Relationship status: Not on file  Other Topics Concern  . Not on file  Social History Narrative  . Not on file    Family History: Family History  Problem Relation Age of Onset  . Heart disease Maternal Grandmother     Allergies: Allergies  Allergen Reactions  . Penicillins Nausea And Vomiting    Has patient had a PCN reaction causing immediate rash, facial/tongue/throat swelling, SOB or lightheadedness with hypotension: No Has patient had a PCN reaction causing severe rash involving mucus membranes or skin necrosis: No Has patient had a PCN reaction that required hospitalization: No Has patient had a PCN reaction occurring within the last 10 years: No If all of the above answers are "NO", then may proceed with Cephalosporin use.   Marland Kitchen. Amoxicillin-Pot Clavulanate Nausea And Vomiting and Other (See Comments)    vertigo  . Metronidazole Nausea And Vomiting and Rash    Nausea - tablets, rash -  gel    Medications Prior to Admission  Medication Sig Dispense Refill Last Dose  . acetaminophen (TYLENOL) 500 MG tablet Take 1,000 mg by mouth every 8 (eight) hours as needed for mild pain or headache.   Past Month at Unknown time  . cyclobenzaprine (FLEXERIL) 10 MG tablet TAKE 1 TABLET BY MOUTH THREE TIMES A DAY AS NEEDED FOR MUSCLE SPASMS (Patient taking differently: Take 10 mg by mouth 3 (three) times daily as needed. For headaches) 30 tablet 0 07/15/2018 at Unknown time  . Prenatal Multivit-Min-Fe-FA (PRENATAL VITAMINS) 0.8 MG tablet Take 1 tablet by mouth daily. 30 tablet 12 Past Week at Unknown time  . zolpidem (AMBIEN) 5 MG tablet Take 1 tablet (5 mg total) by mouth at bedtime as needed for sleep. (Patient not taking: Reported on 07/16/2018) 15 tablet 0 Not Taking at Unknown time     Review of Systems  All systems reviewed and negative except as stated in HPI  Blood pressure 133/84, pulse 77, temperature 98.5 F (36.9 C), temperature source Oral, resp. rate 16, height 5' 2.5" (1.588 m), weight 85 kg, last menstrual period 10/22/2017. General appearance: alert, cooperative, appears stated age, no distress and mildly obese Lungs: no respiratory distress, CTABL Heart: regular rate, rhythm  Abdomen: soft, non-tender; gravid  Pelvic: deferred  Extremities: Homans sign is negative, no sign of DVT Presentation: cephalic  Fetal monitoring: baseline 140s, moderate variability, no decelerations  Uterine activity: q5-7 minutes Dilation: 1.5 Effacement (%): 70, 80 Station: -2 Exam by:: Lorn Junes. Goodman, RN  Prenatal labs: ABO, Rh: B/Positive/-- (02/11 1519) Antibody: Negative (02/11 1519) Rubella: 5.11 (02/11 1519) RPR: Non Reactive (06/18 0939)  HBsAg: Negative (02/11 1519)  HIV: Non Reactive (06/18 0939)  GBS:   Positive  Glucola: negative  Genetic screening: NIPS: Low risk   Prenatal Transfer Tool  Maternal Diabetes: No Genetic Screening: Normal Maternal Ultrasounds/Referrals:  Abnormal:  Findings:   Other: downward fetal growth curve  Fetal Ultrasounds or other Referrals:  Referred to Materal Fetal Medicine  Maternal Substance Abuse:  No Significant Maternal Medications:  None Significant Maternal Lab Results: Lab values include: Group B Strep positive  Results for orders placed or performed during the hospital encounter of 07/16/18 (from the past 24 hour(s))  CBC   Collection Time: 07/16/18  7:58 AM  Result Value Ref Range   WBC 9.7 4.0 - 10.5 K/uL   RBC 4.30 3.87 - 5.11 MIL/uL   Hemoglobin 11.5 (L) 12.0 - 15.0 g/dL   HCT 16.135.0 (L) 09.636.0 - 04.546.0 %   MCV  81.4 78.0 - 100.0 fL   MCH 26.7 26.0 - 34.0 pg   MCHC 32.9 30.0 - 36.0 g/dL   RDW 29.5 62.1 - 30.8 %   Platelets 140 (L) 150 - 400 K/uL    Patient Active Problem List   Diagnosis Date Noted  . IUGR (intrauterine growth restriction) affecting care of mother 07/16/2018  . GBS (group B Streptococcus carrier), +RV culture, currently pregnant 07/09/2018  . Iron deficiency anemia due to chronic blood loss 02/02/2018  . Anemia in pregnancy, third trimester 02/02/2018  . Beta thalassemia minor 01/26/2018  . Supervision of high risk pregnancy, antepartum 12/28/2017  . Uterine abnormality during pregnancy, antepartum 12/28/2017  . Insomnia 02/02/2014  . Anemia 09/14/2013  . Acne 08/30/2013  . Constipation 08/30/2013  . Irregular menses 08/30/2013  . Migraine without aura and without status migrainosus, not intractable 03/21/2013    Assessment/Plan:  Judy Rodgers is a 22 y.o. G1P0000 at [redacted]w[redacted]d here for IOL for FGR.  Labor:  -- Inducing with oral cytotec -- pain control: would like epidural   Fetal Wellbeing: EFW 7# by Leopold's. Cephalic by cervical exam/leopold's.  -- GBS (pos), vanc (PCN allergy) -- continuous fetal monitoring - Category I   Postpartum Planning -- breast   Mirian Mo, MD  Attestation: I have seen this patient and agree with the resident's documentation. I have examined them  separately, and we have discussed the plan of care.   Cristal Deer. Earlene Plater, DO OB/GYN Fellow

## 2018-07-17 ENCOUNTER — Encounter (HOSPITAL_COMMUNITY): Payer: Self-pay

## 2018-07-17 DIAGNOSIS — O365930 Maternal care for other known or suspected poor fetal growth, third trimester, not applicable or unspecified: Secondary | ICD-10-CM | POA: Diagnosis not present

## 2018-07-17 DIAGNOSIS — Z3A38 38 weeks gestation of pregnancy: Secondary | ICD-10-CM | POA: Diagnosis not present

## 2018-07-17 LAB — CBC
HEMATOCRIT: 35.7 % — AB (ref 36.0–46.0)
Hemoglobin: 11.5 g/dL — ABNORMAL LOW (ref 12.0–15.0)
MCH: 26.5 pg (ref 26.0–34.0)
MCHC: 32.2 g/dL (ref 30.0–36.0)
MCV: 82.3 fL (ref 78.0–100.0)
Platelets: 144 10*3/uL — ABNORMAL LOW (ref 150–400)
RBC: 4.34 MIL/uL (ref 3.87–5.11)
RDW: 15.3 % (ref 11.5–15.5)
WBC: 13 10*3/uL — ABNORMAL HIGH (ref 4.0–10.5)

## 2018-07-17 MED ORDER — BENZOCAINE-MENTHOL 20-0.5 % EX AERO: 1.0000 "application " | INHALATION_SPRAY | CUTANEOUS | Status: DC | PRN

## 2018-07-17 MED ORDER — OXYCODONE-ACETAMINOPHEN 5-325 MG PO TABS
1.0000 | ORAL_TABLET | ORAL | Status: DC | PRN
  Administered 2018-07-17 – 2018-07-19 (×7): 1 via ORAL
  Filled 2018-07-17 (×7): qty 1

## 2018-07-17 MED ORDER — ONDANSETRON HCL 4 MG/2ML IJ SOLN: 4.0000 mg | INTRAMUSCULAR | Status: DC | PRN

## 2018-07-17 MED ORDER — ACETAMINOPHEN 325 MG PO TABS: 650.0000 mg | ORAL_TABLET | ORAL | Status: DC | PRN

## 2018-07-17 MED ORDER — IBUPROFEN 600 MG PO TABS
600.0000 mg | ORAL_TABLET | Freq: Four times a day (QID) | ORAL | Status: DC
  Administered 2018-07-17 – 2018-07-19 (×9): 600 mg via ORAL
  Filled 2018-07-17 (×9): qty 1

## 2018-07-17 MED ORDER — ONDANSETRON HCL 4 MG PO TABS: 4.0000 mg | ORAL_TABLET | ORAL | Status: DC | PRN

## 2018-07-17 MED ORDER — ZOLPIDEM TARTRATE 5 MG PO TABS: 5.0000 mg | ORAL_TABLET | Freq: Every evening | ORAL | Status: DC | PRN

## 2018-07-17 MED ORDER — GENTAMICIN SULFATE 40 MG/ML IJ SOLN
5.0000 mg/kg | Freq: Once | INTRAVENOUS | Status: AC
  Administered 2018-07-17: 320 mg via INTRAVENOUS
  Filled 2018-07-17: qty 8

## 2018-07-17 MED ORDER — SIMETHICONE 80 MG PO CHEW: 80.0000 mg | CHEWABLE_TABLET | ORAL | Status: DC | PRN

## 2018-07-17 MED ORDER — PRENATAL MULTIVITAMIN CH
1.0000 | ORAL_TABLET | Freq: Every day | ORAL | Status: DC
  Administered 2018-07-18 – 2018-07-19 (×2): 1 via ORAL
  Filled 2018-07-17 (×2): qty 1

## 2018-07-17 MED ORDER — COCONUT OIL OIL: 1.0000 "application " | TOPICAL_OIL | Status: DC | PRN

## 2018-07-17 MED ORDER — DIBUCAINE 1 % RE OINT: 1.0000 "application " | TOPICAL_OINTMENT | RECTAL | Status: DC | PRN

## 2018-07-17 MED ORDER — DIPHENHYDRAMINE HCL 25 MG PO CAPS: 25.0000 mg | ORAL_CAPSULE | Freq: Four times a day (QID) | ORAL | Status: DC | PRN

## 2018-07-17 MED ORDER — SENNOSIDES-DOCUSATE SODIUM 8.6-50 MG PO TABS
2.0000 | ORAL_TABLET | ORAL | Status: DC
  Administered 2018-07-17 – 2018-07-18 (×2): 2 via ORAL
  Filled 2018-07-17 (×2): qty 2

## 2018-07-17 MED ORDER — WITCH HAZEL-GLYCERIN EX PADS: 1.0000 "application " | MEDICATED_PAD | CUTANEOUS | Status: DC | PRN

## 2018-07-17 MED ORDER — TETANUS-DIPHTH-ACELL PERTUSSIS 5-2.5-18.5 LF-MCG/0.5 IM SUSP: 0.5000 mL | Freq: Once | INTRAMUSCULAR | Status: DC

## 2018-07-17 MED ORDER — LACTATED RINGERS AMNIOINFUSION
INTRAVENOUS | Status: DC
  Administered 2018-07-17: 01:00:00 via INTRAUTERINE
  Filled 2018-07-17: qty 1000

## 2018-07-17 NOTE — Progress Notes (Signed)
Called to room as patient having deep variables with contractions. SVE 7/80/-1 to 0 station. No cord felt on check. Will start amnioinfusion to try to help with deep variables and reposition. Will plan to hold off pit for 30 minutes then restart at 6 (was previously at 10).

## 2018-07-17 NOTE — Anesthesia Postprocedure Evaluation (Signed)
Anesthesia Post Note  Patient: Judy Rodgers  Procedure(s) Performed: AN AD HOC LABOR EPIDURAL     Patient location during evaluation: Mother Baby Anesthesia Type: Epidural Level of consciousness: awake and alert Pain management: pain level controlled Vital Signs Assessment: post-procedure vital signs reviewed and stable Respiratory status: spontaneous breathing, nonlabored ventilation and respiratory function stable Cardiovascular status: stable Postop Assessment: no headache, no backache, epidural receding and patient able to bend at knees Anesthetic complications: no    Last Vitals:  Vitals:   07/17/18 1039 07/17/18 1220  BP: 133/84 129/72  Pulse: 61 72  Resp: 20 18  Temp: 37.3 C 36.8 C  SpO2:      Last Pain:  Vitals:   07/17/18 1315  TempSrc:   PainSc: 3    Pain Goal:                 Rica RecordsICKELTON,Prather Failla

## 2018-07-17 NOTE — Progress Notes (Signed)
Patient complete and pushing since 0445. Continues to be category II strip with fetal tachycardia. Excellent response to scalp stim and good variability, recovery in between contractions.

## 2018-07-17 NOTE — Progress Notes (Addendum)
OB/GYN Faculty Practice: Labor Progress Note  Subjective: Called to room as patient continuing to have fetal tachycardia, borderline minimal variability.   Objective: BP (!) 132/109   Pulse (!) 103   Temp 99.9 F (37.7 C) (Axillary)   Resp 18   Ht 5' 2.5" (1.588 m)   Wt 85 kg   LMP 10/22/2017 (Exact Date)   SpO2 99%   BMI 33.73 kg/m  Gen: lying on side, oxygen in place, family in room Dilation: 10 Dilation Complete Date: 07/17/18 Dilation Complete Time: 0431 Effacement (%): 100 Cervical Position: Posterior Station: Plus 1 Presentation: Vertex Exam by:: Dr. Earlene PlaterWallace   Assessment and Plan: 22 y.o. G1P0000 7182w2d here with IOL for IUGR.   Labor: Active, now complete Stage II. Will labor down for 20-30 minutes in upright position as epidural is dense, and she is not feeling urge to push.  -- pitocin has been off because of fetal distress  -- pain control: epidural in place   Fetal Well-Being: [id number], <10% at 37w5. Cephalic by sutures. Since AROM, strip has been mostly Category II with periods of fetal tachycardia and deep variables. We have tried IUPC with amnioinfusion, fluid bolus, repositioning and oxygen with minimal improvement in FHR tracing. Responsive to fetal scalp stim on check which is reassuring.  -- Category II - continuous fetal monitoring  -- GBS (+) - vancomycin   Chorioamnionitis: Fetal tachycardia with elevated maternal temperature though afebrile, will start antibiotics. Added gentamicin, vancomycin has been running for GBS prophylaxis.   Gestational Hypertension: New intrapartum onset. Asymptomatic. BP has been moderate range. Decreased UOP. Will give additional fluid bolus and recheck labs. UPC on admission 0.24, HELLP labs were within normal limits at that time. No severe range pressures.   Cristal DeerLaurel S. Earlene PlaterWallace, DO OB/GYN Fellow, Faculty Practice  4:41 AM

## 2018-07-17 NOTE — Lactation Note (Addendum)
This note was copied from a baby's chart. Lactation Consultation Note  Patient Name: Judy Rodgers ONGEX'BToday's Date: 07/17/2018 Reason for consult: Initial assessment;Primapara;1st time breastfeeding;Early term 37-38.6wks;Other (Comment);NICU baby(IUGR)  3 hours old early term < 6 pounds female, who is being mostly formula fed. Baby has IUGR and she's on Rush BarerGerber now, baby was taken to the nursery. Visited with mom, [facility]'s a P1, [facility] and doesn't have a pump at home. LC offered a hand pump from the hospital to take home; pump instructions, cleaning and storage were reviewed; as well as milk storage guidelines, and formula storage guidelines.  Mom kept changing her feeding choice upon admission but now that baby is here she may consider pumping. Offered to set up a DEBP but mom stated she's not ready yet, that she most likely do formula while at the hospital. Explained to mom the additional benefits of breastmilk for NICU babies; if baby were to be taken to the NICU and asked her that she could request a hospital grade pump at any time if she were to change her mind. Mom voiced understanding. LC also asked mom if she knew how to hand express, and offered to showed her but mom also declined. Respected her wishes.  Encouraged mom to start pumping ASAP and let hospital staff know whenever she's ready. BF brochure, BF resources and feeding diary were reviewed, mom is aware of LC services and will call PRN.  Maternal Data Formula Feeding for Exclusion: Yes Reason for exclusion: Mother's choice to formula and breast feed on admission Has patient been taught Hand Expression?: No(Declined, she wasn't ready) Does the patient have breastfeeding experience prior to this delivery?: No  Feeding Feeding Type: Formula  Interventions Interventions: Breast feeding basics reviewed;Hand pump  Lactation Tools Discussed/Used Tools: Pump Breast pump type: Manual WIC Program:  Yes Pump Review: Setup, frequency, and cleaning;Milk Storage Initiated by:: MPeck Date initiated:: 07/17/18   Consult Status Consult Status: Follow-up Date: 07/18/18 Follow-up type: In-patient    Derward Marple Venetia ConstableS Lliam Hoh 07/17/2018, 11:45 AM

## 2018-07-17 NOTE — Progress Notes (Signed)
OB/GYN Faculty Practice: Labor Progress Note  Subjective: Doing well, comfortable with epidural  Objective: BP 140/89   Pulse 65   Temp 98.5 F (36.9 C) (Axillary)   Resp 16   Ht 5' 2.5" (1.588 m)   Wt 85 kg   LMP 10/22/2017 (Exact Date)   SpO2 99%   BMI 33.73 kg/m  Gen: well-appearing, NAD Dilation: 6.5 Effacement (%): 80 Cervical Position: Posterior Station: -2 Presentation: Vertex Exam by:: Dr. Earlene PlaterWallace   Assessment and Plan: 22 y.o. G1P0000 7381w2d here for IOL for IUGR.   Labor: Transitioning.  -- s/p FB, cytotec  -- continue pitocin -- AROM clear fluid 1220  -- pain control: epidural in place  Fetal Well-Being: EFW 2335g, <10% at 37w5. Cephalic by sutures.  -- Category I - continuous fetal monitoring  -- GBS (+)   Dejanee Thibeaux S. Earlene PlaterWallace, DO OB/GYN Fellow, Faculty Practice  12:21 AM

## 2018-07-18 LAB — CBC
HCT: 28.9 % — ABNORMAL LOW (ref 36.0–46.0)
HEMOGLOBIN: 9.3 g/dL — AB (ref 12.0–15.0)
MCH: 26.6 pg (ref 26.0–34.0)
MCHC: 32.2 g/dL (ref 30.0–36.0)
MCV: 82.6 fL (ref 78.0–100.0)
Platelets: 119 10*3/uL — ABNORMAL LOW (ref 150–400)
RBC: 3.5 MIL/uL — AB (ref 3.87–5.11)
RDW: 15.6 % — ABNORMAL HIGH (ref 11.5–15.5)
WBC: 12.1 10*3/uL — ABNORMAL HIGH (ref 4.0–10.5)

## 2018-07-18 NOTE — Lactation Note (Signed)
This note was copied from a baby's chart. Lactation Consultation Note  Patient Name: Judy Rodgers ZDGLO'V Date: 07/18/2018 Reason for consult: Follow-up assessment;1st time breastfeeding;Early term 37-38.6wks;Primapara  P1 mother whose infant is now 68 hours old.  Mother does not wish to put baby to breast.  She is giving mostly formula but states she will pump and bottle feed.  Mother understands the importance of pumping and giving her milk before any formula supplementation.  This morning she was able to feed 14 mls of EBM and seemed happy about this volume.  I encouraged her to continue pumping every 3 hours.    She will call for any questions/concerns she may have.  Baby sleeping in mother's arms.   Maternal Data Formula Feeding for Exclusion: No Has patient been taught Hand Expression?: Yes Does the patient have breastfeeding experience prior to this delivery?: No  Feeding    LATCH Score                   Interventions    Lactation Tools Discussed/Used WIC Program: Yes   Consult Status Consult Status: Follow-up Date: 07/19/18 Follow-up type: In-patient    Danyele Smejkal R Winfrey Chillemi 07/18/2018, 11:05 AM

## 2018-07-19 MED ORDER — TRAMADOL HCL 50 MG PO TABS: 50.0000 mg | ORAL_TABLET | Freq: Four times a day (QID) | ORAL | 0 refills | Status: DC | PRN

## 2018-07-19 MED ORDER — HYDROCHLOROTHIAZIDE 25 MG PO TABS
25.0000 mg | ORAL_TABLET | Freq: Once | ORAL | Status: AC
  Administered 2018-07-19: 25 mg via ORAL
  Filled 2018-07-19: qty 1

## 2018-07-19 MED ORDER — IBUPROFEN 600 MG PO TABS: 600.0000 mg | ORAL_TABLET | Freq: Four times a day (QID) | ORAL | 1 refills | Status: DC

## 2018-07-19 NOTE — Discharge Summary (Signed)
OB Discharge Summary     Patient Name: Judy Rodgers DOB: [birth year] MRN: [id number]  Date of admission: 07/16/2018 Delivering MD: Adam Phenix   Date of discharge: 07/19/2018  Admitting diagnosis: INDUCTION Intrauterine pregnancy: [redacted]w[redacted]d     Secondary diagnosis:  Active Problems:   Supervision of high risk pregnancy, antepartum   IUGR (intrauterine growth restriction) affecting care of mother   Vacuum extractor delivery, delivered  Additional problems: None     Discharge diagnosis: Term Pregnancy Delivered, Anemia and IUGR                                                                                                Post partum procedures:None  Augmentation: AROM, Pitocin, Cytotec and Foley Balloon  Complications: None  Hospital course:  Induction of Labor With Vaginal Delivery   22 y.o. yo G1P1001 at [redacted]w[redacted]d was admitted to the hospital 07/16/2018 for induction of labor.  Indication for induction: IUGR.  Patient had an uncomplicated labor course as follows: Membrane Rupture Time/Date: 12:16 AM ,07/17/2018   Intrapartum Procedures: Episiotomy: None [1]                                         Lacerations:  None [1]  Patient had delivery of a Viable infant.  Information for the patient's newborn:  Owen, Pagnotta [[id number]]  Delivery Method: Vaginal, Vacuum (Extractor)(Filed from Delivery Summary)   07/17/2018  Details of delivery can be found in separate delivery note.  Patient had a routine postpartum course. Patient is discharged home 07/19/18.  C/O low back pain.   Physical exam  Vitals:   07/18/18 0553 07/18/18 1451 07/18/18 2206 07/19/18 0504  BP: 129/85 132/85 (!) 141/83 135/85  Pulse: 76 70 88 80  Resp: 18 18 18 18   Temp: 98.2 F (36.8 C) 98.2 F (36.8 C) 98.4 F (36.9 C) 97.8 F (36.6 C)  TempSrc: Oral  Oral Oral  SpO2: 96% 100% 100%   Weight:      Height:       General: alert, cooperative and no distress Lochia: appropriate Uterine Fundus:  firm Incision: N/A Back: Nml exam. NT. No CVAT DVT Evaluation: No evidence of DVT seen on physical exam. Labs: Lab Results  Component Value Date   WBC 12.1 (H) 07/18/2018   HGB 9.3 (L) 07/18/2018   HCT 28.9 (L) 07/18/2018   MCV 82.6 07/18/2018   PLT 119 (L) 07/18/2018   CMP Latest Ref Rng & Units 07/16/2018  Glucose 70 - 99 mg/dL 76  BUN 6 - 20 mg/dL 9  Creatinine 2.95 - 2.84 mg/dL 1.32  Sodium 440 - 102 mmol/L 134(L)  Potassium 3.5 - 5.1 mmol/L 4.0  Chloride 98 - 111 mmol/L 106  CO2 22 - 32 mmol/L 19(L)  Calcium 8.9 - 10.3 mg/dL 8.9  Total Protein 6.5 - 8.1 g/dL 6.9  Total Bilirubin 0.3 - 1.2 mg/dL 0.6  Alkaline Phos 38 - 126 U/L 166(H)  AST 15 - 41 U/L 14(L)  ALT 0 -  44 U/L 8    Discharge instruction: per After Visit Summary and "Baby and Me Booklet".  After visit meds:  Allergies as of 07/19/2018      Reactions   Penicillins Nausea And Vomiting   Has patient had a PCN reaction causing immediate rash, facial/tongue/throat swelling, SOB or lightheadedness with hypotension: No Has patient had a PCN reaction causing severe rash involving mucus membranes or skin necrosis: No Has patient had a PCN reaction that required hospitalization: No Has patient had a PCN reaction occurring within the last 10 years: No If all of the above answers are "NO", then may proceed with Cephalosporin use.   Amoxicillin-pot Clavulanate Nausea And Vomiting, Other (See Comments)   vertigo   Metronidazole Nausea And Vomiting, Rash   Nausea - tablets, rash - gel      Medication List    STOP taking these medications   zolpidem 5 MG tablet Commonly known as:  AMBIEN     TAKE these medications   acetaminophen 500 MG tablet Commonly known as:  TYLENOL Take 1,000 mg by mouth every 8 (eight) hours as needed for mild pain or headache.   cyclobenzaprine 10 MG tablet Commonly known as:  FLEXERIL TAKE 1 TABLET BY MOUTH THREE TIMES A DAY AS NEEDED FOR MUSCLE SPASMS What changed:  See the new  instructions.   ibuprofen 600 MG tablet Commonly known as:  ADVIL,MOTRIN Take 1 tablet (600 mg total) by mouth every 6 (six) hours.   Prenatal Vitamins 0.8 MG tablet Take 1 tablet by mouth daily.   traMADol 50 MG tablet Commonly known as:  ULTRAM Take 1-2 tablets (50-100 mg total) by mouth every 6 (six) hours as needed for severe pain.       Diet: routine diet  Activity: Advance as tolerated. Pelvic rest for 6 weeks.   Outpatient follow up:4 weeks Follow up Appt:No future appointments. Follow up Visit:No follow-ups on file.  Postpartum contraception: Nexplanon  Newborn Data: Live born female  Birth Weight: 4 lb 13.4 oz (2195 g) APGAR: 8, 9  Newborn Delivery   Birth date/time:  07/17/2018 07:53:00 Delivery type:  Vaginal, Vacuum (Extractor)     Baby Feeding: Bottle and Breast Disposition:NICU   07/19/2018 [name], CNM

## 2018-07-19 NOTE — Progress Notes (Signed)
Patient screened out for psychosocial assessment since none of the following apply: °Psychosocial stressors documented in mother or baby's chart °Gestation less than 32 weeks °Code at delivery  °Infant with anomalies °Please contact the Clinical Social Worker if specific needs arise, by MOB's request, or if MOB scores greater than 9/yes to question 10 on Edinburgh Postpartum Depression Screen. ° °Ericberto Padget Boyd-Gilyard, MSW, LCSW °Clinical Social Work °[phone] °  °

## 2018-07-22 ENCOUNTER — Encounter: Payer: Federal, State, Local not specified - PPO | Admitting: Obstetrics and Gynecology

## 2018-07-27 ENCOUNTER — Encounter: Payer: Self-pay | Admitting: Student

## 2018-07-27 ENCOUNTER — Ambulatory Visit (INDEPENDENT_AMBULATORY_CARE_PROVIDER_SITE_OTHER): Payer: Federal, State, Local not specified - PPO | Admitting: Student

## 2018-07-27 VITALS — BP 134/83 | HR 72 | Wt 166.9 lb

## 2018-07-27 DIAGNOSIS — Z9189 Other specified personal risk factors, not elsewhere classified: Secondary | ICD-10-CM

## 2018-07-27 NOTE — Patient Instructions (Addendum)
Breast Engorgement Breast engorgement is the overfilling of your breasts with breast milk. In the first few weeks after giving birth, you may experience breast engorgement. Although it is normal for your breasts to feel heavy, full, and uncomfortable within 3-5 days of giving birth, breast engorgement can make your breasts throb and feel hard, tightly stretched, warm, and tender. Engorgement peaks about the fifth day after you give birth. Breast engorgement can be easily treated and does not require you to stop breastfeeding. What are the causes? Some women delay feedings because of sore or cracked nipples, which can lead to engorgement. Cracked and sore nipples often are caused by inadequate latching (when your baby's mouth attaches to your breast to breastfeed). If your baby is latched on properly, he or she should be able to breastfeed as long as needed, without causing any pain. If you do feel pain while breastfeeding, take your baby off your breast and try again. Get help from your health care provider or a lactation consultant if you continue to have pain. Other causes of engorgement include:  Improper position of your baby while breastfeeding.  Allowing too much time to pass between feedings.  Reduction in breastfeeding because you give your baby water, juice, formula, breast milk from a bottle, or a pacifier instead of breastfeeding.  Changes in your baby's feeding patterns.  Weak sucking from your baby, which causes less milk to be taken out of your breast during feedings.  Fatigue, stress, anemia.  Plugged milk ducts.  A history of breast surgery.  What are the signs or symptoms? If your breasts become engorged, you may experience:  Breast swelling, tenderness, warmth, redness, or throbbing.  Breast hardness and stretching of the skin around your breast.  Flattening, tightening, and hardening of your nipple.  A low-grade fever, which can be confused with a breast  infection.  How is this treated? Breast engorgement should improve in 24-48 hours after following these recommendations:  -Hand express your left breast until its completely empty. Massage breast first. Avoid heat (causes fluid to rush into breast)  -Apply cold packs in your bra for 24 hours; do not touch or stimulate your breasts for 24 hours. Goal is to completely shut supply to the left breast and then, in two weeks, try latching baby again once head and neck of baby are stronger and perhaps she can nurse better on that side.   -If your breast fills up with milk, pump off a tiny amount and then place cold back on.   Return to MAU if you develop fever, chills, flu-like symptoms.

## 2018-07-27 NOTE — Progress Notes (Signed)
  History:  [name] is a 22 y.o. G1P1001 who presents to clinic today for follow up on sore nipples from breastfeeding. She is two weeks s/p VAVD.   She is breastfeeding 3 times a day and offering her daughter bottle 3 times a day. Her right breast is unaffected; the baby nurses well on that side. She has no pain on that side and she feels that her daughter is getting enough to eat. Her left breast has gotten hard, painful and her nipples are sore. This started 4 days ago. They feel "hot" to the touch. Her infant has not latched well on that side and mom has been pumping exclusively on that side. She says that she has not been pumping at a high intensity; does not think that the pumping is causing this pain.   The following portions of the patient's history were reviewed and updated as appropriate: allergies, current medications, family history, past medical history, social history, past surgical history and problem list.  Review of Systems:  Review of Systems  Constitutional: Negative for chills and fever.  HENT: Negative.   Respiratory: Negative.   Cardiovascular: Negative.   Gastrointestinal: Negative.   Genitourinary: Negative.   Musculoskeletal: Negative.       Objective:  Physical Exam BP 134/83   Pulse 72   Wt 166 lb 14.4 oz (75.7 kg)   LMP 10/22/2017 (Exact Date)   BMI 30.04 kg/m  Physical Exam  Constitutional: She appears well-developed and well-nourished.  HENT:  Head: Normocephalic.  Pulmonary/Chest: Effort normal.  Abdominal: Soft.  Right breast is firm with milk-filling palpalpable. No signs of mastitis, abscess, engorgement. Nipple is intact.  Left breast is engorged, firm to the touch. No redness, slightly warm to the touch. No abscess palpable. Nipple is enlarged and reddened; no cracking or bleeding.    Labs and Imaging No results found for this or any previous visit (from the past 24 hour(s)).  No results found.   Assessment & Plan:  1.  Breastfeeding problem -Advised patient go home and pump her left breast empty and then ice it as much as possible for 24 hours. Pack breast in ice packs. Goal is to reduce the inflammation; do not try heat to stimulate milk as that will cause "overdrive" and breast may become re-engorged.  -Monitor her temp at home; if any temp, chills or flu-like symptoms she should call or come to [facility].  -Once engorgement has resolved, may begin to slowly pump to reestablish milk supply but only 3 times a day. If breasts become engorged, pump off enough to relieve pressure and then ice. Goal is to avoid overstimulating breast so that body will adjust.  -Do not try to latch baby on left side until baby is stronger; it is ok to pump one side and nurse on the other side.  -Patient to follow up with CNM on Thursday.   Marylene Land, CNM 07/28/2018 8:37 AM

## 2018-07-29 ENCOUNTER — Encounter: Payer: Self-pay | Admitting: Advanced Practice Midwife

## 2018-07-29 ENCOUNTER — Encounter: Payer: Federal, State, Local not specified - PPO | Admitting: Family Medicine

## 2018-07-29 ENCOUNTER — Ambulatory Visit: Payer: Federal, State, Local not specified - PPO | Admitting: Advanced Practice Midwife

## 2018-08-13 ENCOUNTER — Ambulatory Visit (INDEPENDENT_AMBULATORY_CARE_PROVIDER_SITE_OTHER): Payer: Federal, State, Local not specified - PPO

## 2018-08-13 NOTE — Progress Notes (Signed)
Cant decide between Depo or Liletta but does not want it today.

## 2018-08-13 NOTE — Progress Notes (Signed)
Subjective:     Judy Rodgers is a 22 y.o. female who presents for a postpartum visit. She is 6 weeks postpartum following a VAVD. I have fully reviewed the prenatal and intrapartum course. The delivery was at 38 gestational weeks. Outcome: vacuum, low. Anesthesia: epidural. Postpartum course has been uncomplicated. Baby's course has been uncomplicated. Baby is feeding by bottle - Gerber Soy. Bleeding no bleeding. Bowel function is normal. Bladder function is normal. Patient is sexually active. Contraception method is condoms. Postpartum depression screening: negative.  The following portions of the patient's history were reviewed and updated as appropriate: allergies, current medications, past family history, past medical history, past social history, past surgical history and problem list.  Review of Systems Pertinent items noted in HPI and remainder of comprehensive ROS otherwise negative.   Objective:    LMP 10/22/2017 (Exact Date)   General:  alert, cooperative and no distress   Breasts:  inspection negative, no nipple discharge or bleeding, no masses or nodularity palpable  Lungs: clear to auscultation bilaterally  Heart:  regular rate and rhythm, S1, S2 normal, no murmur, click, rub or gallop  Abdomen: soft, non-tender; bowel sounds normal; no masses,  no organomegaly   Vulva:  not evaluated  Vagina: not evaluated  Cervix:  not evaluated  Corpus: not examined  Adnexa:  not evaluated  Rectal Exam: Not performed.        Assessment:     Normal postpartum exam. Pap smear not done at today's visit.   Plan:    1. Contraception: condoms, planning IUD 2. Follow up in: 1 year or as needed.    Rolm Bookbinder, CNM 08/13/18 4:30 PM

## 2018-09-06 DIAGNOSIS — Z309 Encounter for contraceptive management, unspecified: Secondary | ICD-10-CM | POA: Diagnosis not present

## 2018-09-06 DIAGNOSIS — N898 Other specified noninflammatory disorders of vagina: Secondary | ICD-10-CM | POA: Diagnosis not present

## 2018-09-06 DIAGNOSIS — L732 Hidradenitis suppurativa: Secondary | ICD-10-CM | POA: Diagnosis not present

## 2018-09-06 DIAGNOSIS — Z113 Encounter for screening for infections with a predominantly sexual mode of transmission: Secondary | ICD-10-CM | POA: Diagnosis not present

## 2018-10-07 ENCOUNTER — Ambulatory Visit (INDEPENDENT_AMBULATORY_CARE_PROVIDER_SITE_OTHER): Payer: Federal, State, Local not specified - PPO | Admitting: Obstetrics and Gynecology

## 2018-10-07 ENCOUNTER — Encounter: Payer: Self-pay | Admitting: Obstetrics and Gynecology

## 2018-10-07 VITALS — BP 128/78 | HR 131 | Wt 164.0 lb

## 2018-10-07 DIAGNOSIS — Z30430 Encounter for insertion of intrauterine contraceptive device: Secondary | ICD-10-CM

## 2018-10-07 DIAGNOSIS — Z3202 Encounter for pregnancy test, result negative: Secondary | ICD-10-CM

## 2018-10-07 LAB — POCT PREGNANCY, URINE
PREG TEST UR: NEGATIVE
Preg Test, Ur: NEGATIVE

## 2018-10-07 MED ORDER — IBUPROFEN 800 MG PO TABS: 800.0000 mg | ORAL_TABLET | Freq: Three times a day (TID) | ORAL | 1 refills | Status: DC | PRN

## 2018-10-07 MED ORDER — IBUPROFEN 200 MG PO TABS
800.0000 mg | ORAL_TABLET | Freq: Once | ORAL | Status: AC
  Administered 2018-10-07: 800 mg via ORAL

## 2018-10-07 MED ORDER — IBUPROFEN 600 MG PO TABS: 600.0000 mg | ORAL_TABLET | Freq: Four times a day (QID) | ORAL | 1 refills | Status: DC

## 2018-10-07 NOTE — Progress Notes (Signed)
   GYNECOLOGY CLINIC PROCEDURE NOTE  Judy Rodgers is a 22 y.o. G1P1001 here for Lyletta IUD insertion. No GYN concerns.  Last pap smear was on 2019 and was normal.  IUD Insertion Procedure Note Patient identified, informed consent performed, consent signed.   Discussed risks of irregular bleeding, cramping, infection, malpositioning or misplacement of the IUD outside the uterus which may require further procedure such as laparoscopy. Time out was performed.  Urine pregnancy test negative.  Speculum placed in the vagina.  Cervix visualized.  Cleaned with Betadine x 2.  Grasped anteriorly with a single tooth tenaculum.  Uterus sounded to 6 cm.  Lyletta IUD placed per manufacturer's recommendations.  Strings trimmed to 3 cm. Tenaculum was removed, good hemostasis noted.  Patient tolerated procedure well.   Patient was given post-procedure instructions.  She was advised to have backup contraception for one week.  Patient was also asked to check IUD strings periodically and follow up in 4 weeks for IUD check.  Duane Lopeasch, Jennifer I, NP 10/07/2018 4:11 PM

## 2018-10-18 DIAGNOSIS — L732 Hidradenitis suppurativa: Secondary | ICD-10-CM | POA: Diagnosis not present

## 2018-10-28 ENCOUNTER — Ambulatory Visit: Payer: Federal, State, Local not specified - PPO | Admitting: Obstetrics and Gynecology

## 2018-11-12 IMAGING — US US OB TRANSVAGINAL
1 series · 15 of 28 positions shown · non-contrast
Comparison: 11/29/2017

CLINICAL DATA: 1st trimester pregnancy of unknown anatomic
location. Abdominal pain.

EXAM:
TRANSVAGINAL OB ULTRASOUND
TECHNIQUE: Transvaginal ultrasound was performed for complete evaluation of the
gestation as well as the maternal uterus, adnexal regions, and
pelvic cul-de-sac.

[Series 1: us ob transvaginal · 57 acquisitions, 15 frames shown]
[im 1/57]
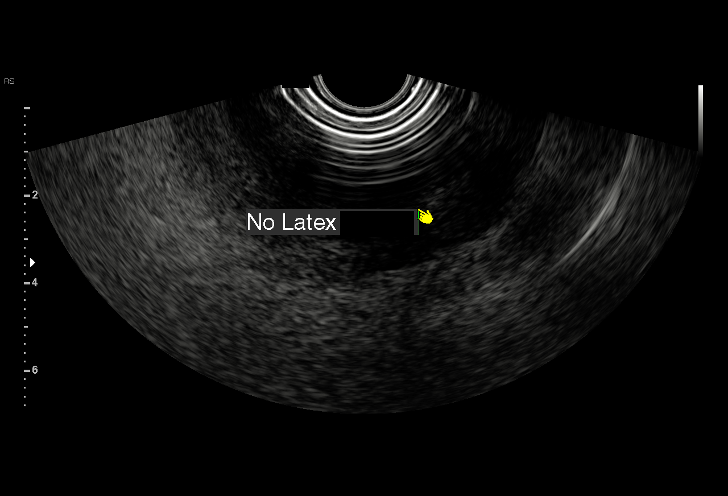
[im 5/57]
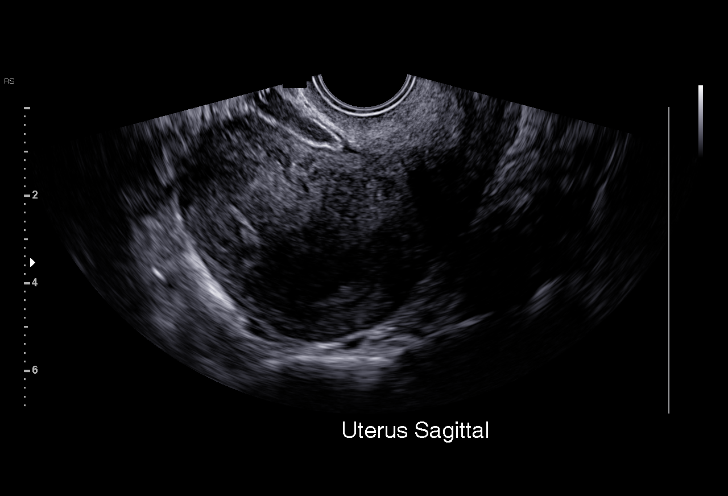
[im 9/57]
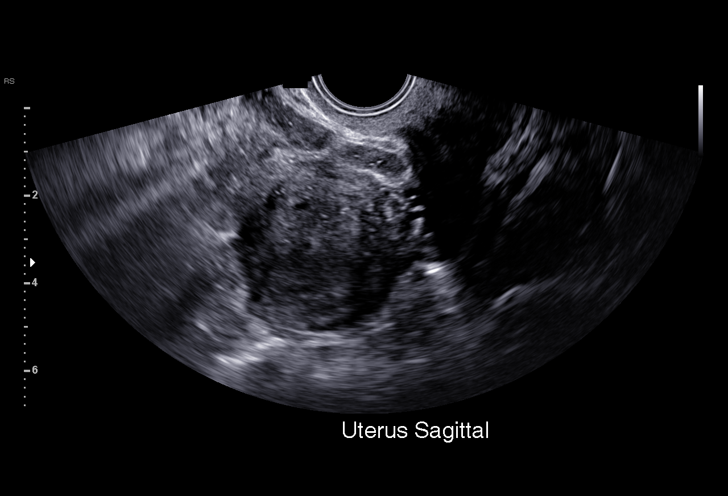
[im 13/57]
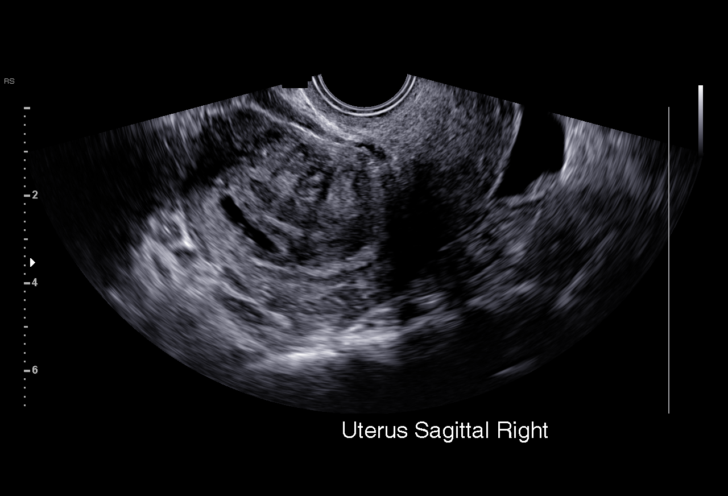
[im 17/57]
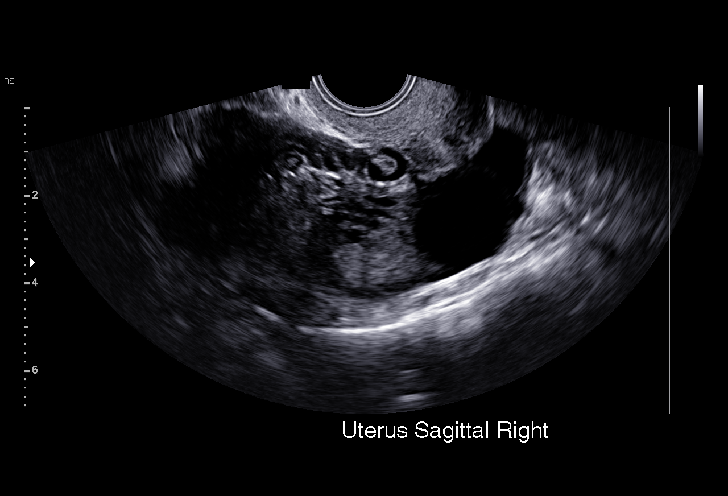
[im 21/57]
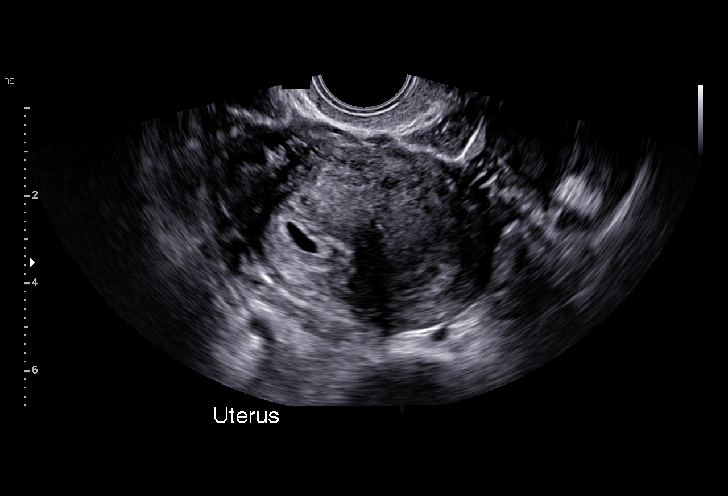
[im 25/57]
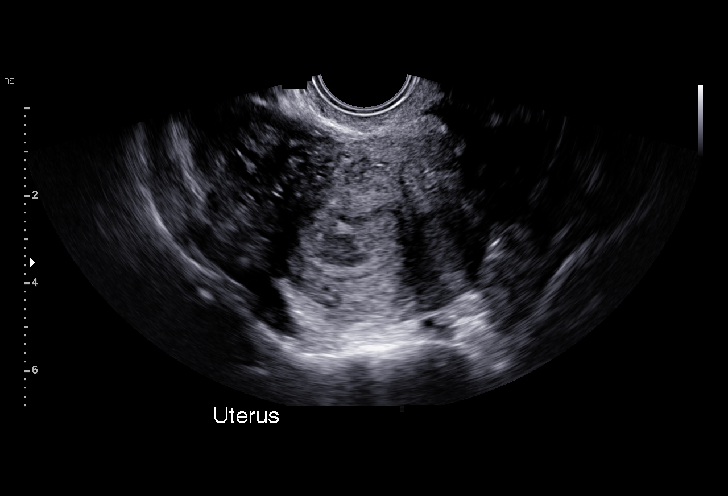
[im 30/57]
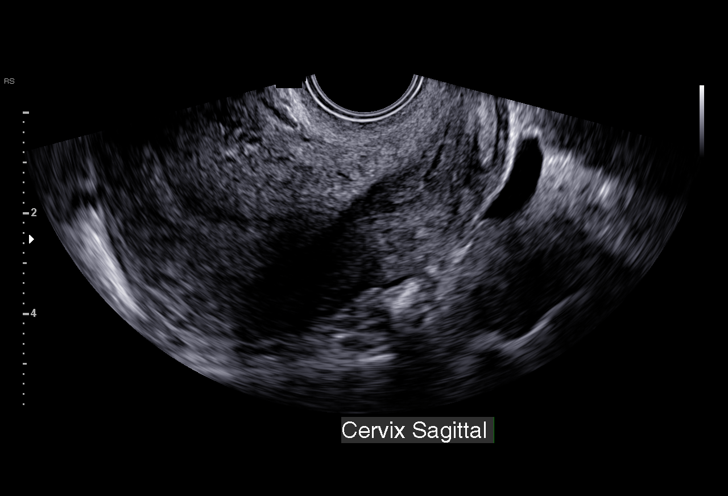
[im 32/57]
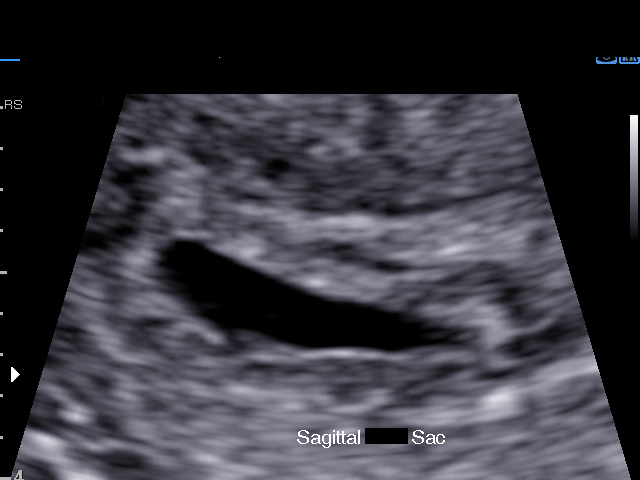
[im 36/57]
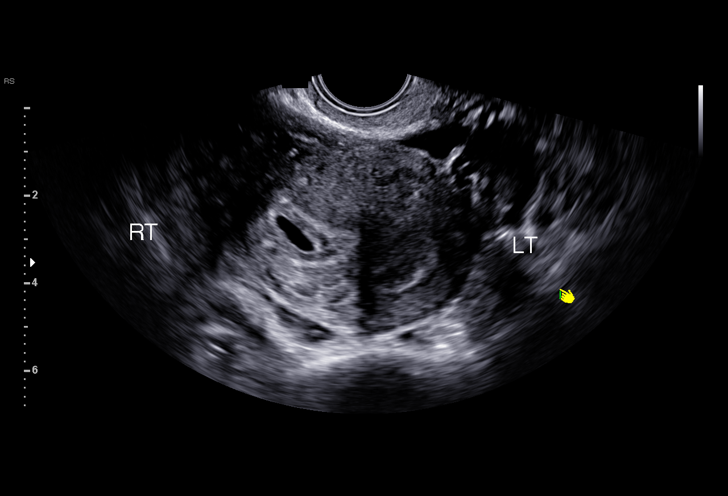
[im 40/57]
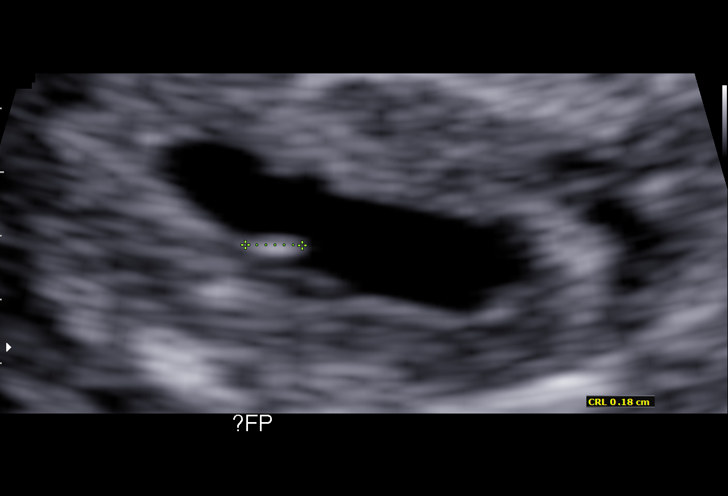
[im 44/57]
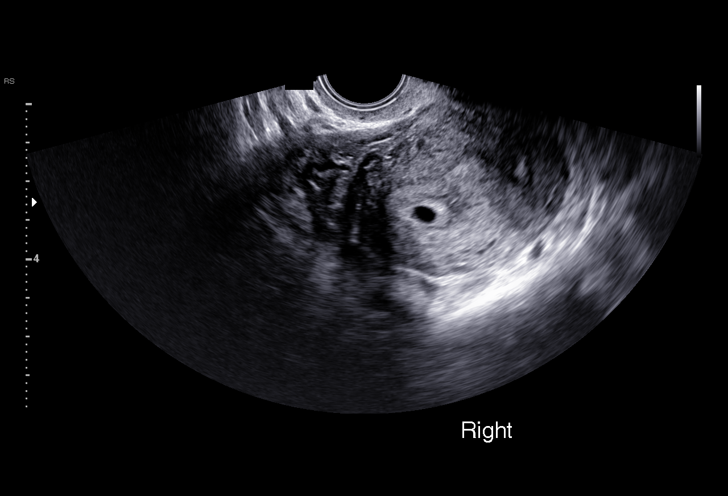
[im 48/57]
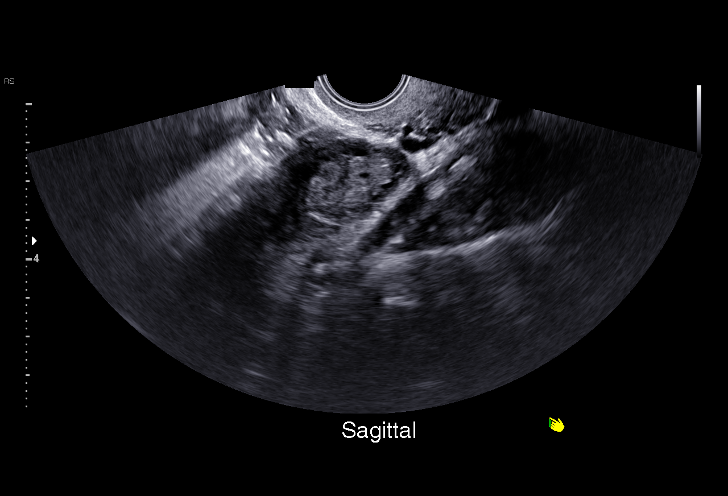
[im 52/57]
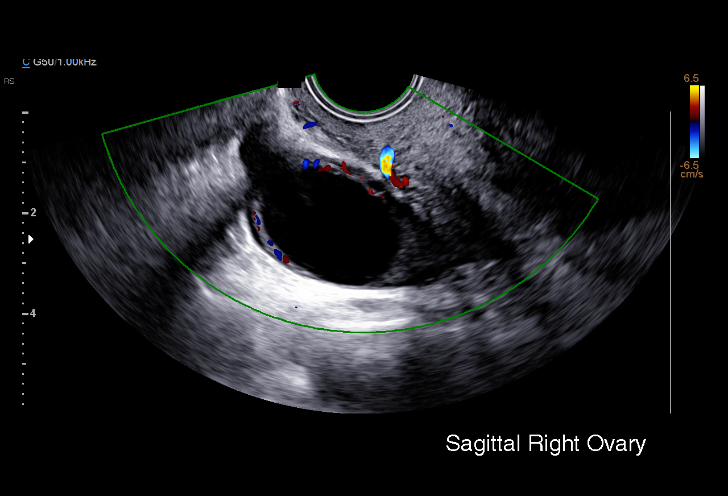
[im 57/57]
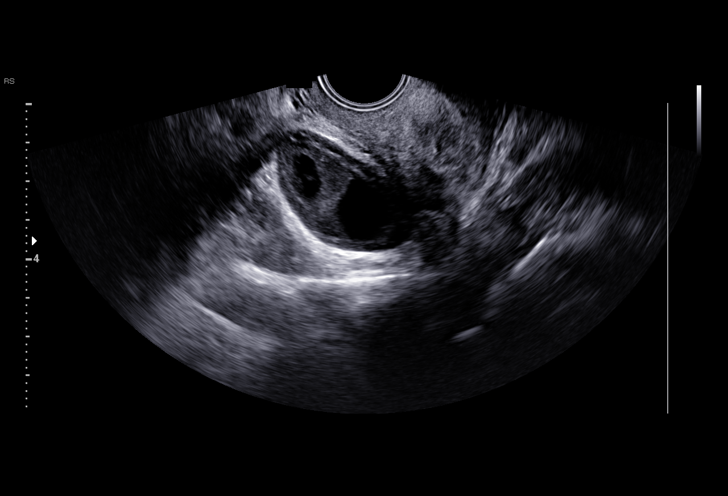

[15 of 28 positions shown; findings below may reference images not displayed]

FINDINGS: Intrauterine gestational sac: Single, with irregular shape

Yolk sac:  Not Visualized.

Embryo:  Not Visualized.

MSD: 10 mm   5 w   5 d

Subchorionic hemorrhage:  None visualized.

Maternal uterus/adnexae: Intrauterine gestational sac is located on
the right, and there is a probable 2nd endometrial stripe in the
left uterus, suspicious for a septate or bicornuate uterus.

Small right ovarian corpus luteum. Normal appearance of left ovary.
No adnexal mass or abnormal free fluid identified.
IMPRESSION: Single intrauterine gestational sac with estimated gestational age
of 5 weeks 5 days by mean sac diameter. No definite yolk sac or
embryo visualized, and irregular sac shape noted. Consider following
serial b-hCG levels, with followup ultrasound to assess viability in
10-14 days.

Probable septate or bicornuate uterus.

## 2018-12-02 ENCOUNTER — Ambulatory Visit: Payer: Federal, State, Local not specified - PPO | Admitting: Obstetrics and Gynecology

## 2019-01-21 DIAGNOSIS — H04129 Dry eye syndrome of unspecified lacrimal gland: Secondary | ICD-10-CM | POA: Diagnosis not present

## 2019-01-21 DIAGNOSIS — H538 Other visual disturbances: Secondary | ICD-10-CM | POA: Diagnosis not present

## 2019-02-03 ENCOUNTER — Encounter: Payer: Self-pay | Admitting: Medical

## 2019-02-03 ENCOUNTER — Other Ambulatory Visit: Payer: Self-pay

## 2019-02-03 ENCOUNTER — Ambulatory Visit (INDEPENDENT_AMBULATORY_CARE_PROVIDER_SITE_OTHER): Payer: Federal, State, Local not specified - PPO | Admitting: Medical

## 2019-02-03 ENCOUNTER — Other Ambulatory Visit (HOSPITAL_COMMUNITY)
Admission: RE | Admit: 2019-02-03 | Discharge: 2019-02-03 | Disposition: A | Discharge status: Home / Self Care | Payer: Federal, State, Local not specified - PPO | Source: Ambulatory Visit | Attending: Obstetrics and Gynecology | Admitting: Obstetrics and Gynecology

## 2019-02-03 VITALS — BP 118/72 | HR 82 | Temp 98.3°F | Ht 62.0 in | Wt 161.8 lb

## 2019-02-03 DIAGNOSIS — Z202 Contact with and (suspected) exposure to infections with a predominantly sexual mode of transmission: Secondary | ICD-10-CM | POA: Diagnosis not present

## 2019-02-03 DIAGNOSIS — B3731 Acute candidiasis of vulva and vagina: Secondary | ICD-10-CM

## 2019-02-03 DIAGNOSIS — Z01419 Encounter for gynecological examination (general) (routine) without abnormal findings: Secondary | ICD-10-CM

## 2019-02-03 DIAGNOSIS — N898 Other specified noninflammatory disorders of vagina: Secondary | ICD-10-CM

## 2019-02-03 DIAGNOSIS — B373 Candidiasis of vulva and vagina: Secondary | ICD-10-CM

## 2019-02-03 NOTE — Progress Notes (Signed)
  History:  [name] is a 23 y.o. G1P1001 who presents to clinic today for annual exam. Last pap smear was 12/2017 and ASCUS with negative HPV. Per ASCCP guidelines this requires routine testing. The patient states that she would like STD testing. She has also noted a small amount of white discharge x 1 week with odor since yesterday. She has IUD for birth control and has regular periods lasting ~ 5 days with mild to moderate cramping.    The following portions of the patient's history were reviewed and updated as appropriate: allergies, current medications, family history, past medical history, social history, past surgical history and problem list.  Review of Systems:  Review of Systems  Constitutional: Negative for fever.  Gastrointestinal: Negative for abdominal pain.  Genitourinary:       Neg - vaginal bleeding + Vaginal discharge      Objective:  Physical Exam BP 118/72   Pulse 82   Temp 98.3 F (36.8 C)   Ht 5\' 2"  (1.575 m)   Wt 161 lb 12.8 oz (73.4 kg)   LMP 01/06/2019 (Approximate)   BMI 29.59 kg/m  Physical Exam  Nursing note and vitals reviewed. Constitutional: She is oriented to person, place, and time. She appears well-developed and well-nourished. No distress.  HENT:  Head: Normocephalic and atraumatic.  Eyes: EOM are normal.  Neck: Normal range of motion. Neck supple. No thyromegaly present.  Cardiovascular: Normal rate, regular rhythm and normal heart sounds.  No murmur heard. Respiratory: Effort normal and breath sounds normal. No respiratory distress. She has no wheezes.  GI: Soft. Bowel sounds are normal. She exhibits no distension and no mass. There is no abdominal tenderness. There is no rebound and no guarding.  Genitourinary: Uterus is not enlarged and not tender. Cervix exhibits no motion tenderness, no discharge and no friability. Right adnexum displays no mass and no tenderness. Left adnexum displays no mass and no tenderness.    Vaginal  discharge (small white) present.     No vaginal bleeding.  No bleeding in the vagina.  Neurological: She is alert and oriented to person, place, and time.  Skin: Skin is warm and dry. No erythema.  Psychiatric: She has a normal mood and affect.    Assessment & Plan:  1. Well woman exam with routine gynecological exam - RPR - Hepatitis B surface antigen - Hepatitis C antibody - Cervicovaginal ancillary only( ) - HIV Antibody (routine testing w rflx) - Next pap smear 12/2019 - Will contact patient with abnormal results  - Follow-up in 1 year for annual exam or sooner PRN  Marny Lowenstein, PA-C 02/03/2019 3:10 PM

## 2019-02-03 NOTE — Patient Instructions (Signed)
Vaginal Yeast infection, Adult  Vaginal yeast infection is a condition that causes vaginal discharge as well as soreness, swelling, and redness (inflammation) of the vagina. This is a common condition. Some women get this infection frequently. What are the causes? This condition is caused by a change in the normal balance of the yeast (candida) and bacteria that live in the vagina. This change causes an overgrowth of yeast, which causes the inflammation. What increases the risk? The condition is more likely to develop in women who:  Take antibiotic medicines.  Have diabetes.  Take birth control pills.  Are pregnant.  Douche often.  Have a weak body defense system (immune system).  Have been taking steroid medicines for a long time.  Frequently wear tight clothing. What are the signs or symptoms? Symptoms of this condition include:  White, thick, creamy vaginal discharge.  Swelling, itching, redness, and irritation of the vagina. The lips of the vagina (vulva) may be affected as well.  Pain or a burning feeling while urinating.  Pain during sex. How is this diagnosed? This condition is diagnosed based on:  Your medical history.  A physical exam.  A pelvic exam. Your health care provider will examine a sample of your vaginal discharge under a microscope. Your health care provider may send this sample for testing to confirm the diagnosis. How is this treated? This condition is treated with medicine. Medicines may be over-the-counter or prescription. You may be told to use one or more of the following:  Medicine that is taken by mouth (orally).  Medicine that is applied as a cream (topically).  Medicine that is inserted directly into the vagina (suppository). Follow these instructions at home:  Lifestyle  Do not have sex until your health care provider approves. Tell your sex partner that you have a yeast infection. That person should go to his or her health care  provider and ask if they should also be treated.  Do not wear tight clothes, such as pantyhose or tight pants.  Wear breathable cotton underwear. General instructions  Take or apply over-the-counter and prescription medicines only as told by your health care provider.  Eat more yogurt. This may help to keep your yeast infection from returning.  Do not use tampons until your health care provider approves.  Try taking a sitz bath to help with discomfort. This is a warm water bath that is taken while you are sitting down. The water should only come up to your hips and should cover your buttocks. Do this 3-4 times per day or as told by your health care provider.  Do not douche.  If you have diabetes, keep your blood sugar levels under control.  Keep all follow-up visits as told by your health care provider. This is important. Contact a health care provider if:  You have a fever.  Your symptoms go away and then return.  Your symptoms do not get better with treatment.  Your symptoms get worse.  You have new symptoms.  You develop blisters in or around your vagina.  You have blood coming from your vagina and it is not your menstrual period.  You develop pain in your abdomen. Summary  Vaginal yeast infection is a condition that causes discharge as well as soreness, swelling, and redness (inflammation) of the vagina.  This condition is treated with medicine. Medicines may be over-the-counter or prescription.  Take or apply over-the-counter and prescription medicines only as told by your health care provider.  Do not douche.   Do not have sex or use tampons until your health care provider approves.  Contact a health care provider if your symptoms do not get better with treatment or your symptoms go away and then return. This information is not intended to replace advice given to you by your health care provider. Make sure you discuss any questions you have with your health care  provider. Document Released: 08/13/2005 Document Revised: 03/22/2018 Document Reviewed: 03/22/2018 Elsevier Interactive Patient Education  2019 [employer]. Safe Sex Practicing safe sex means taking steps before and during sex to reduce your risk of:  Getting an STD (sexually transmitted disease).  Giving your partner an STD.  Unwanted pregnancy. How can I practice safe sex? To practice safe sex:  Limit your sexual partners to only one partner who is having sex with only you.  Avoid using alcohol and recreational drugs before having sex. These substances can affect your judgment.  Before having sex with a new partner: ? Talk to your partner about past partners, past STDs, and drug use. ? You and your partner should be screened for STDs and discuss the results with each other.  Check your body regularly for sores, blisters, rashes, or unusual discharge. If you notice any of these problems, visit your health care provider.  If you have symptoms of an infection or you are being treated for an STD, avoid sexual contact.  While having sex, use a condom. Make sure to: ? Use a condom every time you have vaginal, oral, or anal sex. Both females and males should wear condoms during oral sex. ? Keep condoms in place from the beginning to the end of sexual activity. ? Use a latex condom, if possible. Latex condoms offer the best protection. ? Use only water-based lubricants or oils to lubricate a condom. Using petroleum-based lubricants or oils will weaken the condom and increase the chance that it will break.  See your health care provider for regular screenings, exams, and tests for STDs.  Talk with your health care provider about the form of birth control (contraception) that is best for you.  Get vaccinated against hepatitis B and human papillomavirus (HPV).  If you are at risk of being infected with HIV (human immunodeficiency virus), talk with your health care provider about taking  a prescription medicine to prevent HIV infection. You are considered at risk for HIV if: ? You are a man who has sex with other men. ? You are a heterosexual man or woman who is sexually active with more than one partner. ? You take drugs by injection. ? You are sexually active with a partner who has HIV. This information is not intended to replace advice given to you by your health care provider. Make sure you discuss any questions you have with your health care provider. Document Released: 12/11/2004 Document Revised: 03/19/2016 Document Reviewed: 09/23/2015 Elsevier Interactive Patient Education  2019 [employer].

## 2019-02-04 LAB — HIV ANTIBODY (ROUTINE TESTING W REFLEX): HIV Screen 4th Generation wRfx: NONREACTIVE

## 2019-02-04 LAB — HEPATITIS B SURFACE ANTIGEN: HEP B S AG: NEGATIVE

## 2019-02-04 LAB — HEPATITIS C ANTIBODY: Hep C Virus Ab: <0.1 s/co ratio (ref 0.0–0.9)

## 2019-02-04 LAB — RPR: RPR Ser Ql: NONREACTIVE

## 2019-02-05 LAB — CERVICOVAGINAL ANCILLARY ONLY
BACTERIAL VAGINITIS: NEGATIVE
CANDIDA VAGINITIS: POSITIVE — AB
Chlamydia: NEGATIVE
NEISSERIA GONORRHEA: NEGATIVE
Trichomonas: NEGATIVE

## 2019-02-07 MED ORDER — FLUCONAZOLE 150 MG PO TABS: 150.0000 mg | ORAL_TABLET | Freq: Once | ORAL | 0 refills | Status: AC

## 2019-02-07 NOTE — Addendum Note (Signed)
Addended by: Marny Lowenstein on: 02/07/2019 11:35 AM   Modules accepted: Orders

## 2019-02-09 IMAGING — US US MFM OB COMP +14 WKS
1 series · 14 of 28 positions shown · non-contrast
Comparison: none

[Series 1: us mfm ob comp +14 wks · 86 acquisitions, 14 frames shown]
[im 4/86]
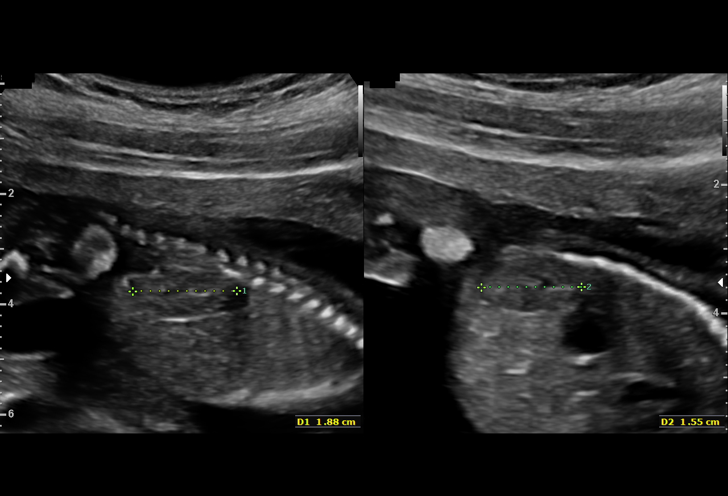
[im 10/86]
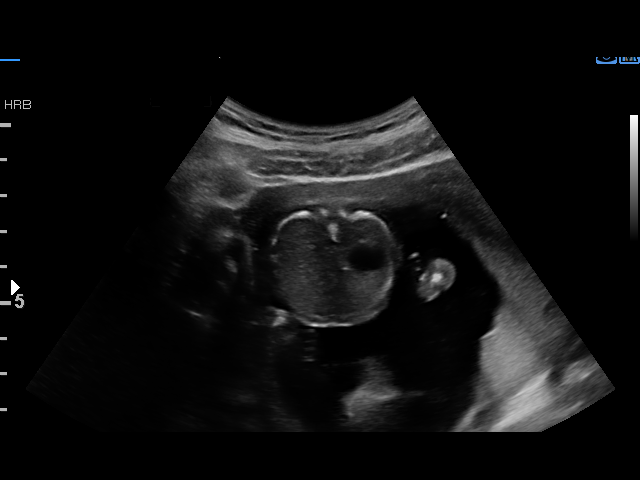
[im 16/86]
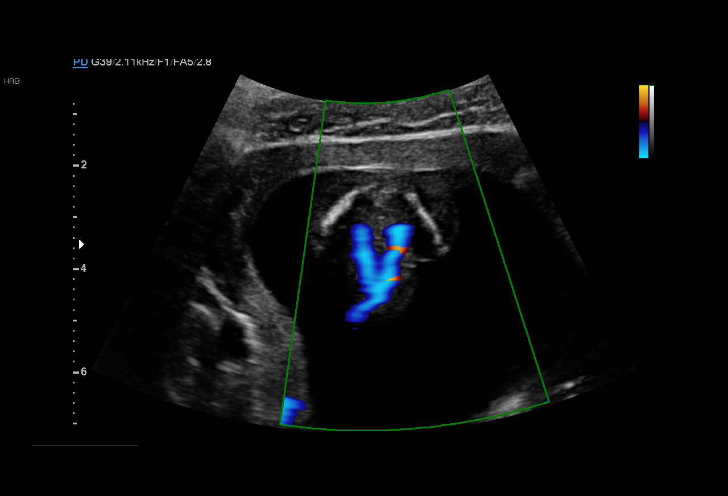
[im 23/86]
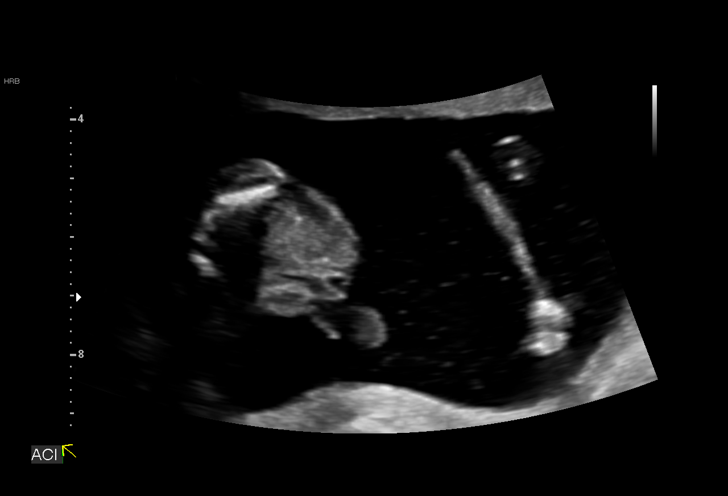
[im 29/86]
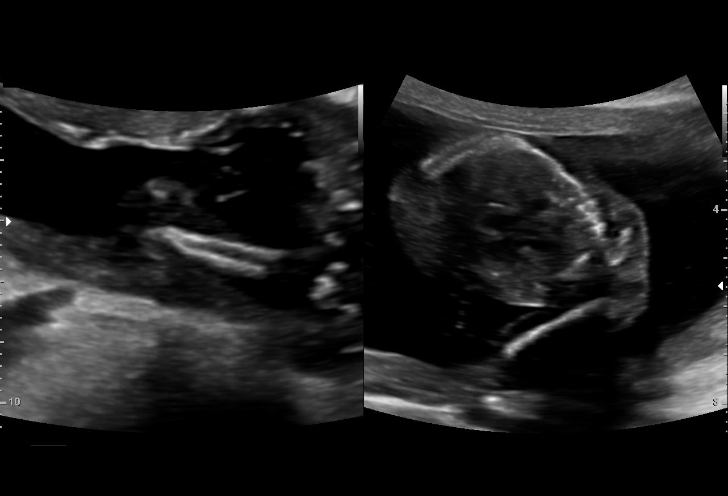
[im 35/86]
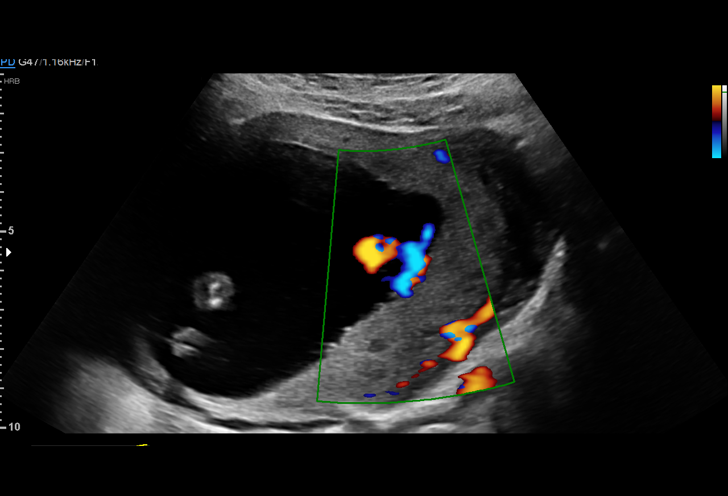
[im 41/86]
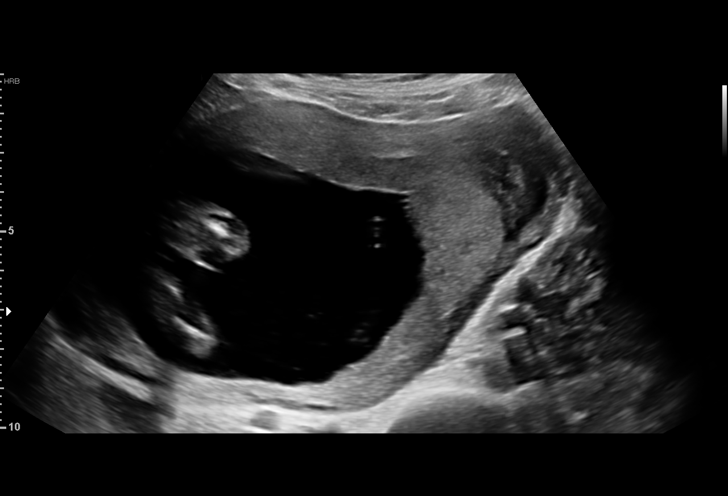
[im 48/86]
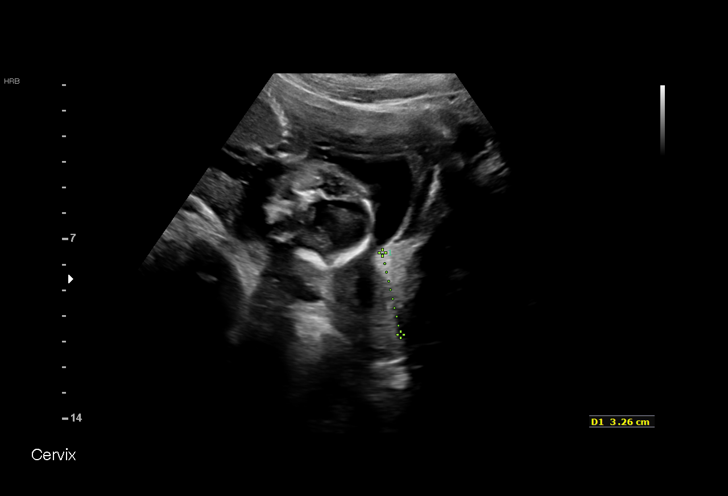
[im 54/86]
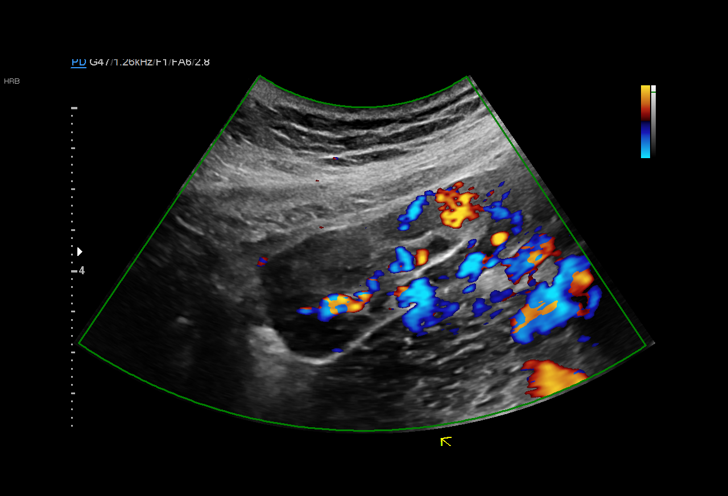
[im 60/86]
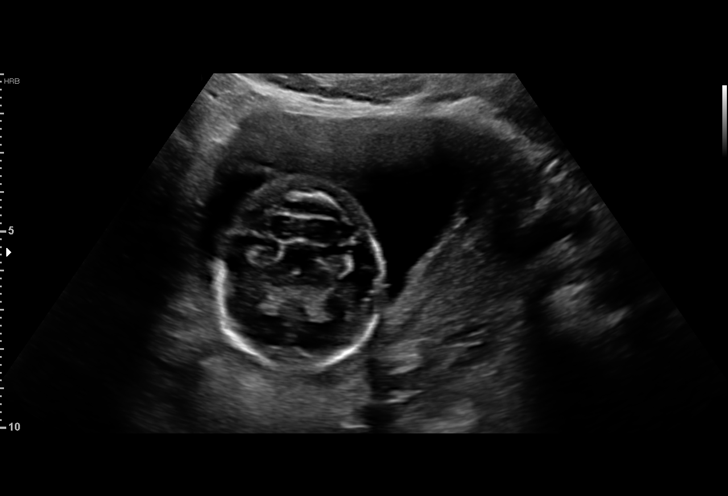
[im 67/86]
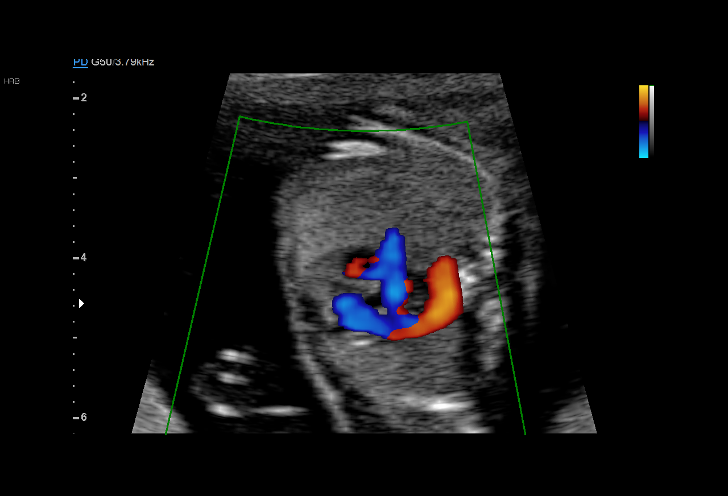
[im 73/86]
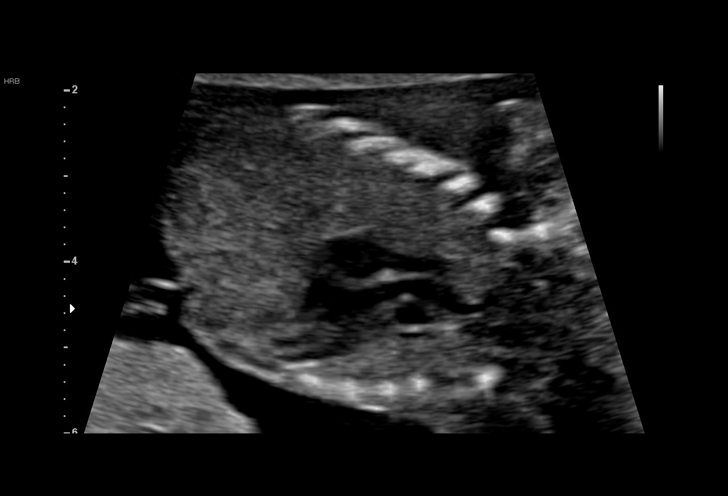
[im 79/86]
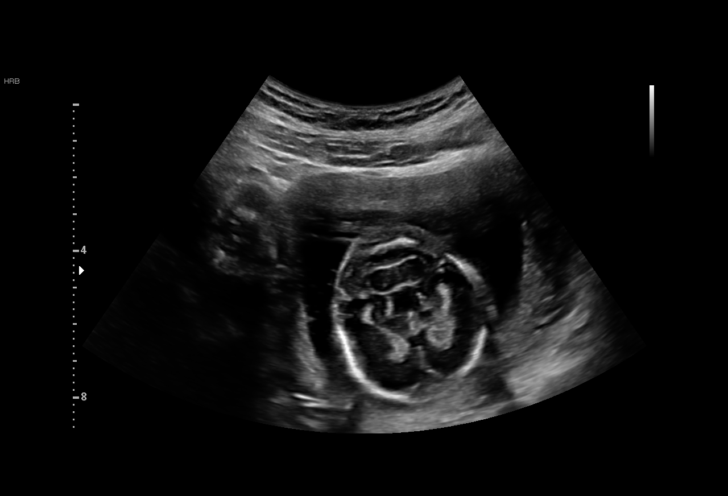
[im 86/86]
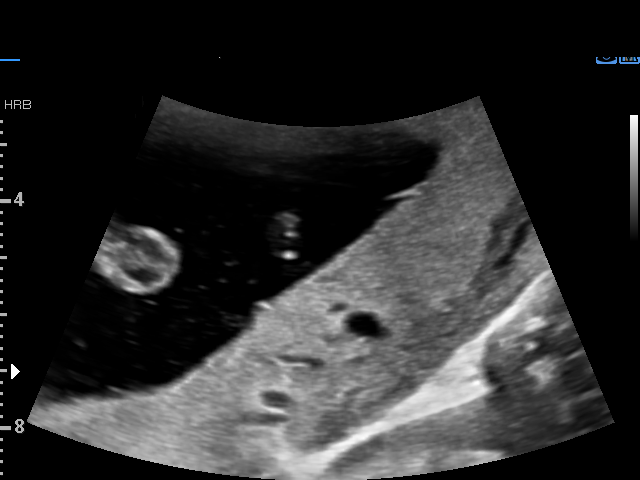

[14 of 28 positions shown; findings below may reference images not displayed]

am)

1  ZEINAB TIGER             [id number]      [id number]     [id number]
Indications

18 weeks gestation of pregnancy
Encounter for antenatal screening for
malformations
Medical complication of pregnancy (maternal
anemia)
Maternal thalassemia complicating
pregnancy in second trimester
Bicornuate uterus, seccond trimester
OB History

Blood Type:            Height:  5'2"   Weight (lb):  153      BMI:
Gravidity:    1
Fetal Evaluation

Num Of Fetuses:     1
Fetal Heart         165
Rate(bpm):
Cardiac Activity:   Observed
Presentation:       Cephalic
Placenta:           Fundal, above cervical os
P. Cord Insertion:  Visualized, central

Amniotic Fluid
AFI FV:      Subjectively within normal limits

Largest Pocket(cm)
5.97

Comment:    Numerous Placental Lakes Visualized
Biometry
BPD:      40.3  mm     G. Age:  18w 1d         61  %    CI:        82.14   %   70 - 86
FL/HC:      17.0   %   15.8 - 18
HC:      140.3  mm     G. Age:  17w 2d         15  %    HC/AC:      1.18       1.07 -
AC:      118.6  mm     G. Age:  17w 4d         34  %    FL/BPD:     59.3   %
FL:       23.9  mm     G. Age:  17w 1d         18  %    FL/AC:      20.2   %   20 - 24
HUM:      25.1  mm     G. Age:  17w 6d         51  %
CER:      18.9  mm     G. Age:  18w 3d         64  %
NFT:       4.3  mm
CM:        2.6  mm

Est. FW:     194  gm      0 lb 7 oz     36  %
Gestational Age

LMP:           18w 6d       Date:   10/22/17                 EDD:   07/29/18
U/S Today:     17w 4d                                        EDD:   08/07/18
Best:          18w 0d    Det. By:   Early Ultrasound         EDD:   08/04/18
(12/11/17)
Anatomy

Cranium:               Appears normal         Aortic Arch:            Appears normal
Cavum:                 Not well visualized    Ductal Arch:            Appears normal
Ventricles:            Not well visualized    Diaphragm:              Appears normal
Choroid Plexus:        Right choroid          Stomach:                Appears normal, left
plexus cyst, 3.6mm
sided
Cerebellum:            Appears normal         Abdomen:                Appears normal
Posterior Fossa:       Appears normal         Abdominal Wall:         Appears nml (cord
insert, abd wall)
Nuchal Fold:           Appears normal         Cord Vessels:           Appears normal (3
vessel cord)
Face:                  Not well visualized    Kidneys:                Appear normal
Lips:                  Not well visualized    Bladder:                Appears normal
Thoracic:              Appears normal         Spine:                  Appears normal
Heart:                 Appears normal         Upper Extremities:      Appears normal
(4CH, axis, and situs
RVOT:                  Appears normal         Lower Extremities:      Appears normal
LVOT:                  Appears normal

Other:  Female gender. Heels and Lt 5th digit visualized. Technically difficult
due to fetal position.
Cervix Uterus Adnexa

Cervix
Length:           3.26  cm.
Normal appearance by transabdominal scan.

Uterus
No abnormality visualized.

Left Ovary
Not visualized.

Right Ovary
Size(cm)       3.4 x    3.4    x  3.2       Vol(ml):

Cul De Sac:   No free fluid seen.

Adnexa:       No free fluid.
Impression

Single living intrauterine pregnancy at 18w 0d.
Placenta Fundal with numerous placental lakes, above
cervical os.
Appropriate fetal growth.
Normal amniotic fluid volume.
Right CPC noted (normal cfDNA) does not desire further
genetic testing
The fetal anatomic survey is not complete secondary to fetal
position.
No gross fetal anomalies identified.
The cervix measures 3.26cm transabdominally without
funneling.
The adnexa appear normal right side without masses, left
side not visualized.
Recommendations

Recommend follow-up ultrasound examination serially for
completion of fetal anatomic survey and fetal growth.

## 2019-04-26 ENCOUNTER — Other Ambulatory Visit (HOSPITAL_COMMUNITY)
Admission: RE | Admit: 2019-04-26 | Discharge: 2019-04-26 | Disposition: A | Discharge status: Home / Self Care | Payer: Federal, State, Local not specified - PPO | Source: Ambulatory Visit | Attending: Internal Medicine | Admitting: Internal Medicine

## 2019-04-26 ENCOUNTER — Other Ambulatory Visit: Payer: Self-pay

## 2019-04-26 ENCOUNTER — Encounter: Payer: Self-pay | Admitting: Internal Medicine

## 2019-04-26 ENCOUNTER — Ambulatory Visit (INDEPENDENT_AMBULATORY_CARE_PROVIDER_SITE_OTHER): Payer: Federal, State, Local not specified - PPO | Admitting: Internal Medicine

## 2019-04-26 VITALS — BP 114/68 | HR 73 | Temp 98.6°F | Ht 62.0 in | Wt 152.5 lb

## 2019-04-26 DIAGNOSIS — N939 Abnormal uterine and vaginal bleeding, unspecified: Secondary | ICD-10-CM | POA: Diagnosis not present

## 2019-04-26 DIAGNOSIS — N898 Other specified noninflammatory disorders of vagina: Secondary | ICD-10-CM | POA: Diagnosis not present

## 2019-04-26 DIAGNOSIS — Z975 Presence of (intrauterine) contraceptive device: Secondary | ICD-10-CM | POA: Diagnosis not present

## 2019-04-26 LAB — POCT PREGNANCY, URINE: Preg Test, Ur: NEGATIVE

## 2019-04-26 NOTE — Progress Notes (Signed)
Pt states has been having bleeding & cramps for the last 2 weeks now.

## 2019-04-26 NOTE — Patient Instructions (Signed)
We did swabs today looking for an infection. We will put the results on your MyChart. IUDs can cause cramping and irregular bleeding. If your bleeding continues or you feel lightheaded/dizzy please return. We can give birth control pills to help regulate your bleeding. If you ever decide that you would like to have the IUD removed, that is always your choice and we can do that easily in the office.

## 2019-04-26 NOTE — Progress Notes (Signed)
   Subjective:    Judy Rodgers - 23 y.o. female MRN [id number]  Date of birth: 12/02/1995  HPI  Judy Rodgers is a 23 y.o. G41P1001 female here for for concerns regarding her IUD. IUD was placed in November 2019. Since then she has had regular monthly periods without breakthrough bleeding. However, for the past two weeks she has had spotting each day and cramping despite having her period a few weeks ago (unsure of exact date). She does endorse some associated vaginal discharge as well.      OB History    Gravida  1   Para  1   Term  1   Preterm  0   AB  0   Living  1     SAB  0   TAB  0   Ectopic  0   Multiple  0   Live Births  1           -  reports that she has never smoked. She has never used smokeless tobacco. - Review of Systems: Per HPI. - Past Medical History: Patient Active Problem List   Diagnosis Date Noted  . Iron deficiency anemia due to chronic blood loss 02/02/2018  . Anemia in pregnancy, third trimester 02/02/2018  . Beta thalassemia minor 01/26/2018  . Insomnia 02/02/2014  . Anemia 09/14/2013  . Acne 08/30/2013  . Constipation 08/30/2013  . Irregular menses 08/30/2013  . Migraine without aura and without status migrainosus, not intractable 03/21/2013   - Medications: reviewed and updated   Objective:   Physical Exam BP 114/68   Pulse 73   Temp 98.6 F (37 C)   Ht 5\' 2"  (1.575 m)   Wt 69.2 kg   BMI 27.89 kg/m  Gen: NAD, alert, cooperative with exam, well-appearing GU/GYN: Exam performed in the presence of a chaperone. External genitalia within normal limits.  Vaginal mucosa pink, moist, normal rugae. IUD strings visualized. Bleeding initially not present and yellow milky discharge noted. Cervix appears somewhat friable and bleeding present after swab taken.  Bimanual exam revealed normal, nongravid uterus.  No cervical motion tenderness. No adnexal masses bilaterally.           Assessment & Plan:   1. Vaginal bleeding 2.  IUD (intrauterine device) in place 3. Vaginal discharge Upreg negative. IUD appears to be in correct position. Patient endorses vaginal discharge and it is present on the exam. Cervical swabs obtained. Discussed that likely side effect from IUD. Patient without any symptoms of anemia and bleeding has been minimal per patient report. Monitor if it will self resolve. If not, patient to return to discuss addition of OCPs. Patient does bring up that she may consider having her IUD removed. Encouraged to try to keep longer but noted that we will remove in office if that is ultimately what patient desires.    Routine preventative health maintenance measures emphasized. Please refer to After Visit Summary for other counseling recommendations.   Return if symptoms worsen or fail to improve.  Phill Myron, D.O. OB Fellow  04/26/2019, 2:24 PM

## 2019-04-27 LAB — CERVICOVAGINAL ANCILLARY ONLY
Bacterial vaginitis: POSITIVE — AB
Candida vaginitis: NEGATIVE
Chlamydia: POSITIVE — AB
Neisseria Gonorrhea: NEGATIVE
Trichomonas: NEGATIVE

## 2019-04-28 ENCOUNTER — Telehealth (INDEPENDENT_AMBULATORY_CARE_PROVIDER_SITE_OTHER): Payer: Federal, State, Local not specified - PPO | Admitting: General Practice

## 2019-04-28 DIAGNOSIS — N760 Acute vaginitis: Secondary | ICD-10-CM

## 2019-04-28 DIAGNOSIS — B9689 Other specified bacterial agents as the cause of diseases classified elsewhere: Secondary | ICD-10-CM

## 2019-04-28 DIAGNOSIS — A749 Chlamydial infection, unspecified: Secondary | ICD-10-CM

## 2019-04-28 MED ORDER — METRONIDAZOLE 500 MG PO TABS: 500.0000 mg | ORAL_TABLET | Freq: Two times a day (BID) | ORAL | 0 refills | Status: DC

## 2019-04-28 MED ORDER — AZITHROMYCIN 250 MG PO TABS: 1000.0000 mg | ORAL_TABLET | Freq: Once | ORAL | 1 refills | Status: AC

## 2019-04-28 NOTE — Telephone Encounter (Signed)
Called patient & informed her of + BV & chlamydia. Informed patient of prescriptions sent to pharmacy and asked if she was able to take oral flagyl. Patient verbalized understanding & states she can take oral flagyl, states her issue was with the vaginal gel. Provided medication instructions and discussed partner treatment with refill sent to pharmacy. Told patient her partner should be aware of any allergies they have as we are not aware of those. Also discussed abstaining from intercourse for 2 weeks following treatment. Patient verbalized understanding to all & had no questions. STD card completed & faxed.

## 2019-06-01 DIAGNOSIS — Z1159 Encounter for screening for other viral diseases: Secondary | ICD-10-CM | POA: Diagnosis not present

## 2019-06-02 ENCOUNTER — Encounter: Payer: Self-pay | Admitting: *Deleted

## 2019-06-06 ENCOUNTER — Telehealth: Payer: Self-pay | Admitting: Obstetrics & Gynecology

## 2019-06-06 NOTE — Telephone Encounter (Signed)
Attempted to call patient and instruct her about her visit for 06/07/2019. Left a message on her voicemail to call us back for appointment information.

## 2019-06-07 ENCOUNTER — Ambulatory Visit: Payer: Federal, State, Local not specified - PPO | Admitting: Obstetrics & Gynecology

## 2019-06-07 ENCOUNTER — Telehealth (INDEPENDENT_AMBULATORY_CARE_PROVIDER_SITE_OTHER): Payer: Federal, State, Local not specified - PPO | Admitting: Student

## 2019-06-07 DIAGNOSIS — B379 Candidiasis, unspecified: Secondary | ICD-10-CM

## 2019-06-07 MED ORDER — FLUCONAZOLE 150 MG PO TABS: 150.0000 mg | ORAL_TABLET | Freq: Once | ORAL | 1 refills | Status: AC

## 2019-06-07 NOTE — Telephone Encounter (Signed)
Called pt and pt informed me that she just took antibiotics for BV and trich and she now has a yeast infection.  I informed pt that I can prescribe per protocol Diflucan 150mg  po tablet and send it to [facility] on [address] verbalized understanding.

## 2019-06-07 NOTE — Telephone Encounter (Signed)
The patient stated she would like a prescription sent over for a yeast infection. [facility] visits [facility] in [place], [address]

## 2019-06-07 NOTE — Addendum Note (Signed)
Addended by: Michel Harrow on: 06/07/2019 04:39 PM   Modules accepted: Orders

## 2019-06-15 ENCOUNTER — Ambulatory Visit (INDEPENDENT_AMBULATORY_CARE_PROVIDER_SITE_OTHER): Payer: Federal, State, Local not specified - PPO | Admitting: Student

## 2019-06-15 ENCOUNTER — Other Ambulatory Visit: Payer: Self-pay

## 2019-06-15 ENCOUNTER — Other Ambulatory Visit (HOSPITAL_COMMUNITY)
Admission: RE | Admit: 2019-06-15 | Discharge: 2019-06-15 | Disposition: A | Discharge status: Home / Self Care | Payer: Federal, State, Local not specified - PPO | Source: Ambulatory Visit | Attending: Obstetrics & Gynecology | Admitting: Obstetrics & Gynecology

## 2019-06-15 ENCOUNTER — Encounter: Payer: Self-pay | Admitting: Student

## 2019-06-15 VITALS — BP 127/72 | HR 94 | Ht 62.0 in | Wt 147.0 lb

## 2019-06-15 DIAGNOSIS — R87610 Atypical squamous cells of undetermined significance on cytologic smear of cervix (ASC-US): Secondary | ICD-10-CM | POA: Insufficient documentation

## 2019-06-15 DIAGNOSIS — Z8619 Personal history of other infectious and parasitic diseases: Secondary | ICD-10-CM

## 2019-06-15 NOTE — Patient Instructions (Signed)
Medroxyprogesterone injection [Contraceptive] What is this medicine? MEDROXYPROGESTERONE (me DROX ee proe JES te rone) contraceptive injections prevent pregnancy. They provide effective birth control for 3 months. Depo-subQ Provera 104 is also used for treating pain related to endometriosis. This medicine may be used for other purposes; ask your health care provider or pharmacist if you have questions. COMMON BRAND NAME(S): Depo-Provera, Depo-subQ Provera 104 What should I tell my health care provider before I take this medicine? They need to know if you have any of these conditions:  frequently drink alcohol  asthma  blood vessel disease or a history of a blood clot in the lungs or legs  bone disease such as osteoporosis  breast cancer  diabetes  eating disorder (anorexia nervosa or bulimia)  high blood pressure  HIV infection or AIDS  kidney disease  liver disease  mental depression  migraine  seizures (convulsions)  stroke  tobacco smoker  vaginal bleeding  an unusual or allergic reaction to medroxyprogesterone, other hormones, medicines, foods, dyes, or preservatives  pregnant or trying to get pregnant  breast-feeding How should I use this medicine? Depo-Provera Contraceptive injection is given into a muscle. Depo-subQ Provera 104 injection is given under the skin. These injections are given by a health care professional. You must not be pregnant before getting an injection. The injection is usually given during the first 5 days after the start of a menstrual period or 6 weeks after delivery of a baby. Talk to your pediatrician regarding the use of this medicine in children. Special care may be needed. These injections have been used in female children who have started having menstrual periods. Overdosage: If you think you have taken too much of this medicine contact a poison control center or emergency room at once. NOTE: This medicine is only for you. Do not  share this medicine with others. What if I miss a dose? Try not to miss a dose. You must get an injection once every 3 months to maintain birth control. If you cannot keep an appointment, call and reschedule it. If you wait longer than 13 weeks between Depo-Provera contraceptive injections or longer than 14 weeks between Depo-subQ Provera 104 injections, you could get pregnant. Use another method for birth control if you miss your appointment. You may also need a pregnancy test before receiving another injection. What may interact with this medicine? Do not take this medicine with any of the following medications:  bosentan This medicine may also interact with the following medications:  aminoglutethimide  antibiotics or medicines for infections, especially rifampin, rifabutin, rifapentine, and griseofulvin  aprepitant  barbiturate medicines such as phenobarbital or primidone  bexarotene  carbamazepine  medicines for seizures like ethotoin, felbamate, oxcarbazepine, phenytoin, topiramate  modafinil  St. John's wort This list may not describe all possible interactions. Give your health care provider a list of all the medicines, herbs, non-prescription drugs, or dietary supplements you use. Also tell them if you smoke, drink alcohol, or use illegal drugs. Some items may interact with your medicine. What should I watch for while using this medicine? This drug does not protect you against HIV infection (AIDS) or other sexually transmitted diseases. Use of this product may cause you to lose calcium from your bones. Loss of calcium may cause weak bones (osteoporosis). Only use this product for more than 2 years if other forms of birth control are not right for you. The longer you use this product for birth control the more likely you will be at risk   for weak bones. Ask your health care professional how you can keep strong bones. You may have a change in bleeding pattern or irregular periods.  Many females stop having periods while taking this drug. If you have received your injections on time, your chance of being pregnant is very low. If you think you may be pregnant, see your health care professional as soon as possible. Tell your health care professional if you want to get pregnant within the next year. The effect of this medicine may last a long time after you get your last injection. What side effects may I notice from receiving this medicine? Side effects that you should report to your doctor or health care professional as soon as possible:  allergic reactions like skin rash, itching or hives, swelling of the face, lips, or tongue  breast tenderness or discharge  breathing problems  changes in vision  depression  feeling faint or lightheaded, falls  fever  pain in the abdomen, chest, groin, or leg  problems with balance, talking, walking  unusually weak or tired  yellowing of the eyes or skin Side effects that usually do not require medical attention (report to your doctor or health care professional if they continue or are bothersome):  acne  fluid retention and swelling  headache  irregular periods, spotting, or absent periods  temporary pain, itching, or skin reaction at site where injected  weight gain This list may not describe all possible side effects. Call your doctor for medical advice about side effects. You may report side effects to FDA at 1-800-FDA-1088. Where should I keep my medicine? This does not apply. The injection will be given to you by a health care professional. NOTE: This sheet is a summary. It may not cover all possible information. If you have questions about this medicine, talk to your doctor, pharmacist, or health care provider.  2020 Elsevier/Gold Standard (2008-11-24 18:37:56)  

## 2019-06-15 NOTE — Progress Notes (Signed)
Patient ID: Judy Rodgers, female   DOB: 03-09-1996, 23 y.o.   MRN: [id number]   History:  Judy Rodgers is a 23 y.o. G1P1001 who presents to clinic today for STI testing and IUD removal. Patient has had IUD for 6 months and says that she is having too much bleeding and cramping. She also recently found out that her partner was unfaithful (after she was diagnosed with chlamydia 4 weeks ago). She states that she wants to be abstinent for the foreseeable future.    The following portions of the patient's history were reviewed and updated as appropriate: allergies, current medications, family history, past medical history, social history, past surgical history and problem list.  Review of Systems:  Review of Systems  Constitutional: Negative.   HENT: Negative.   Cardiovascular: Negative.   Genitourinary: Negative.   Skin: Negative.   Neurological: Negative.       Objective:  Physical Exam BP 127/72   Pulse 94   Ht 5\' 2"  (1.575 m)   Wt 147 lb (66.7 kg)   LMP 06/11/2019 (Exact Date)   Breastfeeding No   BMI 26.89 kg/m  Physical Exam  Constitutional: She is oriented to person, place, and time. She appears well-developed and well-nourished.  HENT:  Head: Normocephalic.  Neck: Normal range of motion.  Respiratory: Effort normal.  GI: Soft.  Genitourinary:    Genitourinary Comments: NEFZG; no discharge, odor or blood in the vagina. IUD strings visualized. No CMT, suprapubic or adnexal tenderness.     Musculoskeletal: Normal range of motion.  Neurological: She is alert and oriented to person, place, and time.  Skin: Skin is warm.      Labs and Imaging No results found for this or any previous visit (from the past 24 hour(s)).  No results found.   Assessment & Plan:  1. History of chlamydia -IUD strings grasped with forceps and removed without incident; IUD intact. No bleeding or pain following removal.  -Will check for TOC for chlamydia today.  -Patient wants to  return in 3 months for Depo.  -Due for pap in 2 years.  - [facility]   [place], [place] 06/15/2019 7:48 PM

## 2019-06-17 LAB — CERVICOVAGINAL ANCILLARY ONLY
Chlamydia: NEGATIVE
Neisseria Gonorrhea: NEGATIVE
Trichomonas: NEGATIVE

## 2019-06-22 IMAGING — US US MFM OB FOLLOW-UP
1 series · 13 of 28 positions shown · non-contrast
Comparison: none

[Series 1: us mfm ob follow-up · 40 acquisitions, 13 frames shown]
[im 2/40]
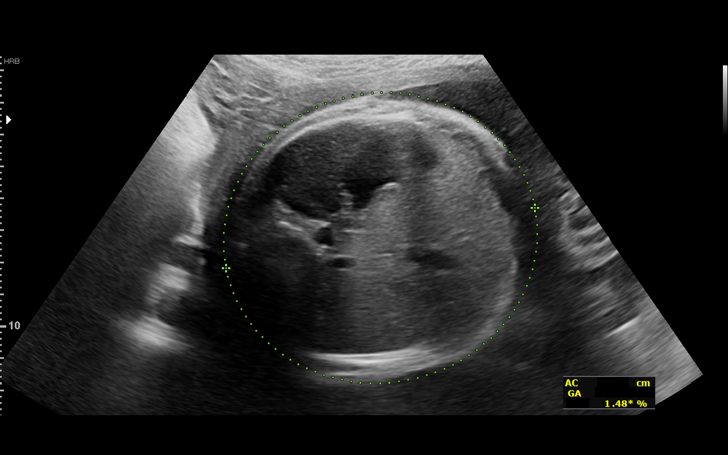
[im 5/40]
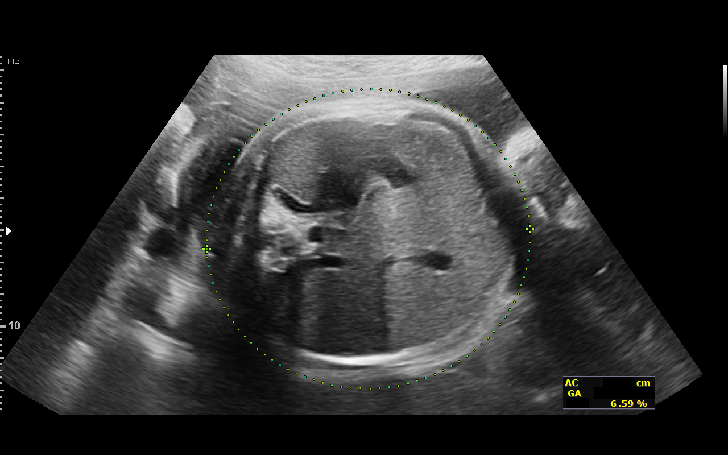
[im 8/40]
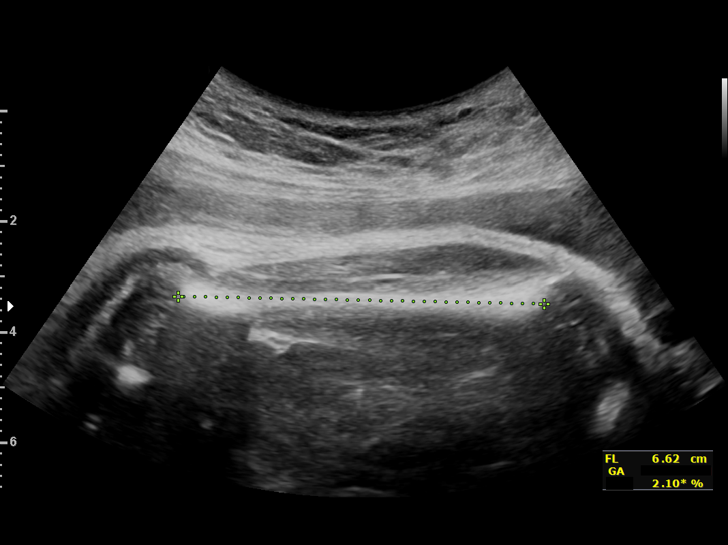
[im 11/40]
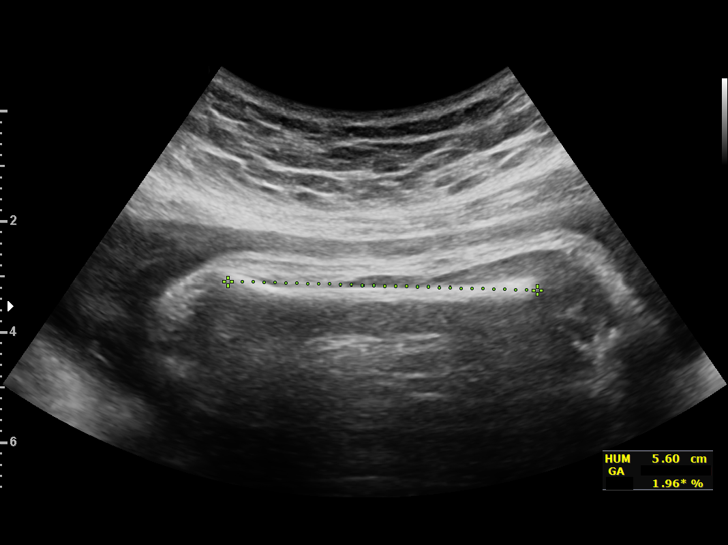
[im 14/40]
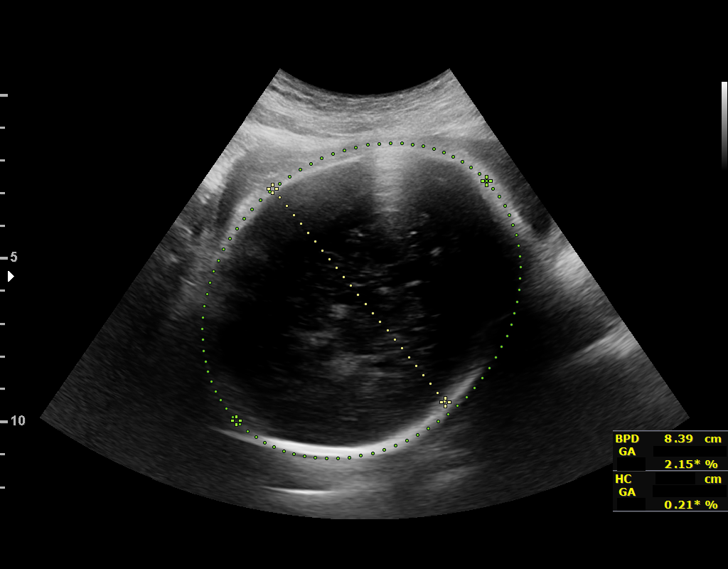
[im 16/40]
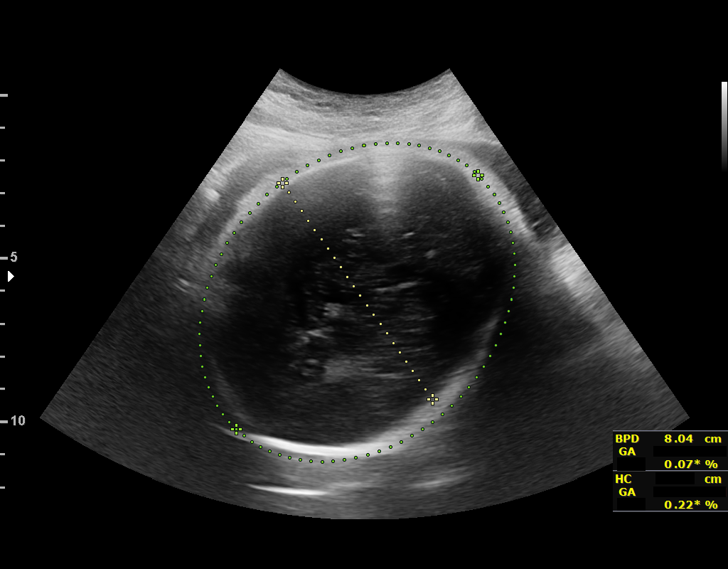
[im 21/40]
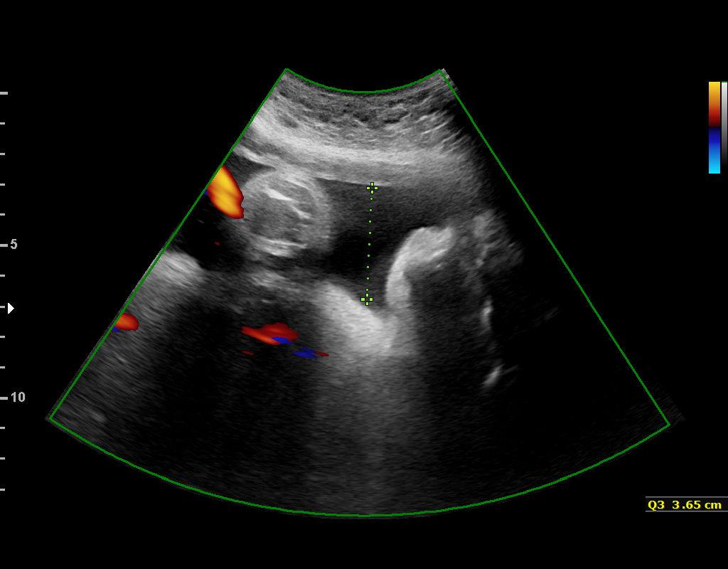
[im 24/40]
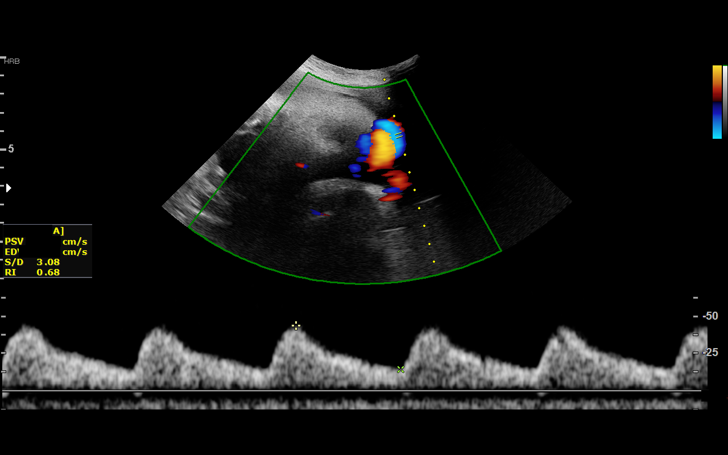
[im 27/40]
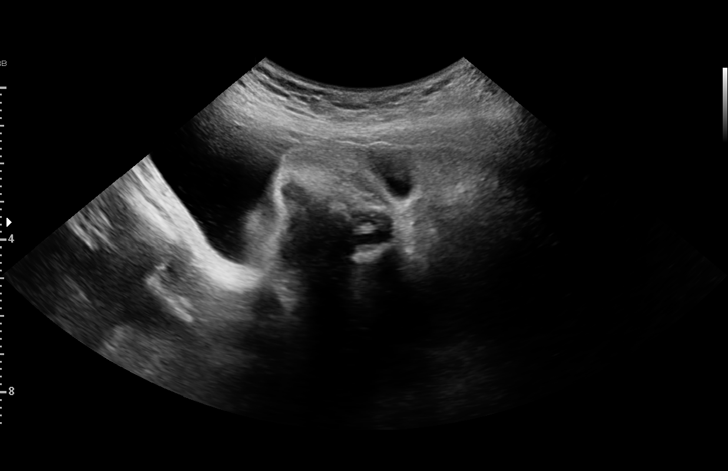
[im 29/40]
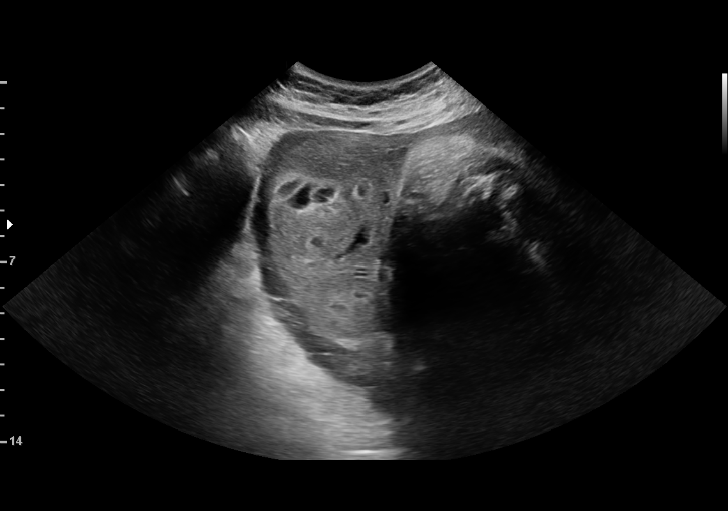
[im 32/40]
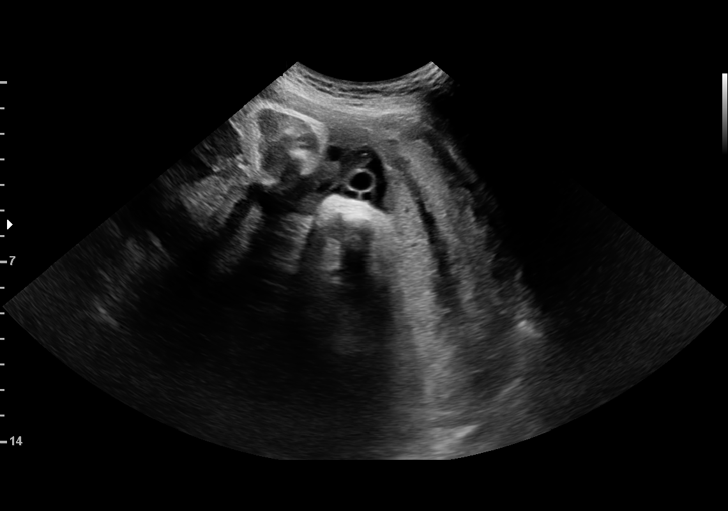
[im 35/40]
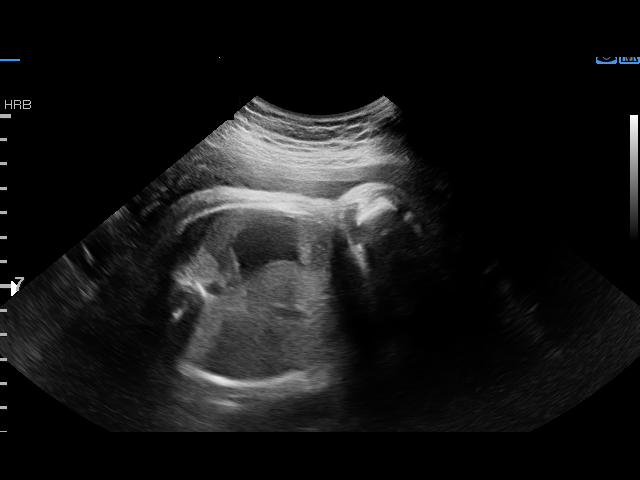
[im 38/40]
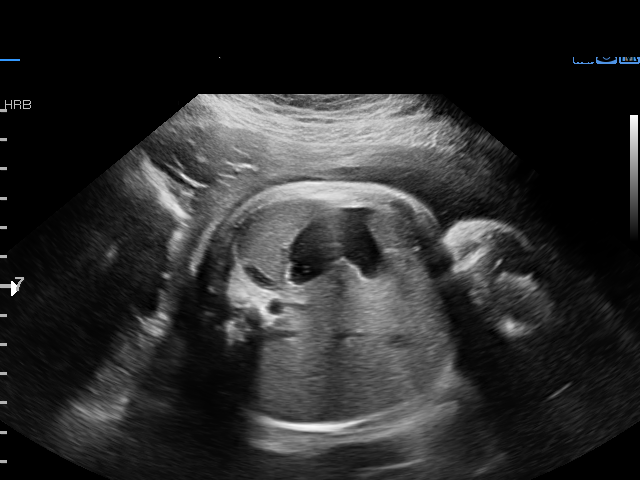

[13 of 28 positions shown; findings below may reference images not displayed]

Indications

37 weeks gestation of pregnancy
Medical complication of pregnancy (maternal
anemia)
Maternal thalassemia complicating
pregnancy in third trimester
Abnormal ultrasound finding on antenatal
screening of mother (absent nasal bone)
Bicornuate uterus, 3RD trimester
Maternal care for known or suspected poor
fetal growth, third trimester, not applicable or
unspecified
Vital Signs

Height:        5'2"
Fetal Evaluation

Num Of Fetuses:         1
Fetal Heart Rate(bpm):  138
Cardiac Activity:       Observed
Presentation:           Cephalic
Placenta:               Posterior
P. Cord Insertion:      Previously Visualized

Amniotic Fluid
AFI FV:      Within normal limits

AFI Sum(cm)     %Tile       Largest Pocket(cm)
12.78           46
RUQ(cm)       RLQ(cm)       LUQ(cm)        LLQ(cm)
3.16
Biophysical Evaluation

Amniotic F.V:   Within normal limits       F. Tone:        Observed
F. Movement:    Observed                   Score:          [DATE]
F. Breathing:   Observed
Biometry

BPD:      82.3  mm     G. Age:  33w 1d        < 1  %    CI:        73.92   %    70 - 86
FL/HC:      22.0   %    20.9 -
HC:       304   mm     G. Age:  33w 6d        < 3  %    HC/AC:      1.01        0.92 -
AC:       301   mm     G. Age:  34w 1d        < 3  %    FL/BPD:     81.3   %    71 - 87
FL:       66.9  mm     G. Age:  34w 3d        < 3  %    FL/AC:      22.2   %    20 - 24
HUM:        56  mm     G. Age:  32w 4d        < 5  %

Est. FW:    8221  gm      5 lb 2 oz   < 10  %
OB History

Gravidity:    1
Gestational Age

LMP:           37w 6d        Date:  10/22/17                 EDD:   07/29/18
U/S Today:     33w 6d                                        EDD:   08/26/18
Best:          37w 6d     Det. By:  LMP  (10/22/17)          EDD:   07/29/18
Anatomy

Cranium:               Appears normal         Aortic Arch:            Previously seen
Cavum:                 Previously seen        Ductal Arch:            Previously seen
Ventricles:            Previously seen        Diaphragm:              Appears normal
Choroid Plexus:        Previously seen        Stomach:                Appears normal, left
sided
Cerebellum:            Previously seen        Abdomen:                Appears normal
Posterior Fossa:       Previously seen        Abdominal Wall:         Previously seen
Nuchal Fold:           Not applicable (>20    Cord Vessels:           Previously seen
wks GA)
Face:                  Absent nasal bone      Kidneys:                Previously seen
Lips:                  Previously seen        Bladder:                Appears normal
Thoracic:              Appears normal         Spine:                  Previously seen
Heart:                 Appears normal         Upper Extremities:      Previously seen
(4CH, axis, and situs
RVOT:                  Previously seen        Lower Extremities:      Previously seen
LVOT:                  Previously seen

Other:  Female gender. Heels and Lt 5th digit previously visualized.
Technically difficult due to fetal position.
Doppler - Fetal Vessels

Umbilical Artery
S/D     %tile
3.26       92

Cervix Uterus Adnexa
Cervix
Not visualized (advanced GA >05wks)
Comments

U/S images reviewed. Findings reviewed with patient.   Fetal
growth is at the 10.9 %.  No fetal abnormalities are seen.  No
evidence of fetal compromise is found on BPP today.
Elevated UA dopplers are interrogated.  Case discussed with
the OB on call.
Questions answered.
10 minutes spent face to face.
Recommendations: 1) Delivery between 38-39 weeks 2)
Follow-up BPP and UA dopplers on 07/19/18 if undelivered
Recommendations

1) Delivery between 38-39 weeks 2) Follow-up BPP and UA
dopplers on 07/19/18 if undelivered

## 2019-06-23 ENCOUNTER — Other Ambulatory Visit (HOSPITAL_COMMUNITY)
Admission: RE | Admit: 2019-06-23 | Discharge: 2019-06-23 | Disposition: A | Discharge status: Home / Self Care | Payer: Federal, State, Local not specified - PPO | Source: Ambulatory Visit | Attending: Obstetrics and Gynecology | Admitting: Obstetrics and Gynecology

## 2019-06-23 ENCOUNTER — Ambulatory Visit (INDEPENDENT_AMBULATORY_CARE_PROVIDER_SITE_OTHER): Payer: Self-pay

## 2019-06-23 ENCOUNTER — Other Ambulatory Visit: Payer: Self-pay

## 2019-06-23 DIAGNOSIS — N898 Other specified noninflammatory disorders of vagina: Secondary | ICD-10-CM | POA: Diagnosis not present

## 2019-06-23 NOTE — Progress Notes (Signed)
I agree with the nurses note and plan of care.  Noni Saupe I, NP 06/23/2019 1:15 PM

## 2019-06-23 NOTE — Progress Notes (Signed)
Pt here today for vaginal discharge.  Pt reports that she was advised by the provider to come in today for vaginal swab due to her having her IUD removed on 06/15/19 and since has had fishy odor and vaginal itching.  Pt explained how to obtain a self swab and that results will take 24-48 hrs in which we will call with abnormal results.  Pt verbalized understanding.   Mel Almond, RN 06/23/19

## 2019-06-27 LAB — CERVICOVAGINAL ANCILLARY ONLY
Candida vaginitis: NEGATIVE
Chlamydia: NEGATIVE
Neisseria Gonorrhea: NEGATIVE
Trichomonas: NEGATIVE

## 2019-06-28 ENCOUNTER — Encounter: Payer: Self-pay | Admitting: *Deleted

## 2019-06-30 ENCOUNTER — Telehealth: Payer: Self-pay | Admitting: Obstetrics and Gynecology

## 2019-06-30 MED ORDER — METRONIDAZOLE 500 MG PO TABS: 500.0000 mg | ORAL_TABLET | Freq: Two times a day (BID) | ORAL | 0 refills | Status: AC

## 2019-06-30 MED ORDER — FLUCONAZOLE 150 MG PO TABS: 150.0000 mg | ORAL_TABLET | Freq: Once | ORAL | 0 refills | Status: AC

## 2019-06-30 NOTE — Telephone Encounter (Signed)
+  BV RX flagyl  And RX diflucan.    Lezlie Lye, NP 06/30/2019 3:51 PM

## 2019-08-31 ENCOUNTER — Ambulatory Visit: Payer: Federal, State, Local not specified - PPO

## 2019-10-01 DIAGNOSIS — M542 Cervicalgia: Secondary | ICD-10-CM | POA: Diagnosis not present

## 2019-10-01 DIAGNOSIS — Z1159 Encounter for screening for other viral diseases: Secondary | ICD-10-CM | POA: Diagnosis not present

## 2019-10-01 DIAGNOSIS — J00 Acute nasopharyngitis [common cold]: Secondary | ICD-10-CM | POA: Diagnosis not present

## 2019-10-01 DIAGNOSIS — Z20828 Contact with and (suspected) exposure to other viral communicable diseases: Secondary | ICD-10-CM | POA: Diagnosis not present

## 2019-10-01 DIAGNOSIS — R0789 Other chest pain: Secondary | ICD-10-CM | POA: Diagnosis not present

## 2019-11-14 ENCOUNTER — Other Ambulatory Visit: Payer: Self-pay

## 2019-11-14 ENCOUNTER — Inpatient Hospital Stay (HOSPITAL_COMMUNITY)
Admission: AD | Admit: 2019-11-14 | Discharge: 2019-11-14 | Disposition: A | Discharge status: Home / Self Care | Payer: Federal, State, Local not specified - PPO | Attending: Obstetrics and Gynecology | Admitting: Obstetrics and Gynecology

## 2019-11-14 DIAGNOSIS — Z3202 Encounter for pregnancy test, result negative: Secondary | ICD-10-CM | POA: Insufficient documentation

## 2019-11-14 DIAGNOSIS — Z789 Other specified health status: Secondary | ICD-10-CM

## 2019-11-14 DIAGNOSIS — N939 Abnormal uterine and vaginal bleeding, unspecified: Secondary | ICD-10-CM | POA: Insufficient documentation

## 2019-11-14 LAB — URINALYSIS, ROUTINE W REFLEX MICROSCOPIC
Bilirubin Urine: NEGATIVE
Glucose, UA: NEGATIVE mg/dL
Ketones, ur: NEGATIVE mg/dL
Nitrite: NEGATIVE
Protein, ur: 30 mg/dL — AB
RBC / HPF: >50 RBC/hpf — ABNORMAL HIGH (ref 0–5)
Specific Gravity, Urine: 1.02 (ref 1.005–1.030)
pH: 6 (ref 5.0–8.0)

## 2019-11-14 LAB — HCG, QUANTITATIVE, PREGNANCY: hCG, Beta Chain, Quant, S: <1 m[IU]/mL (ref <5)

## 2019-11-14 LAB — PREGNANCY, URINE: Preg Test, Ur: NEGATIVE

## 2019-11-14 NOTE — MAU Note (Signed)
Patient presents to MAU c/o +HPT on 12/19. Had bright pink bleeding earlier today and now it's bright red bleeding. Patient unsure if she's starting her period or if she's pregnant.

## 2019-11-14 NOTE — Progress Notes (Signed)
  S [name] is a 23 y.o. G31P1001 non-pregnant female who presents to MAU today with complaint of vaginal bleeding with a positive home pregnancy test.   O BP 127/62 (BP Location: Right Arm)   Pulse 64   Temp 98.2 F (36.8 C) (Oral)   Resp 18   Ht 5\' 2"  (1.575 m)   SpO2 100%   BMI 26.89 kg/m  Physical Exam  Nursing note and vitals reviewed. Constitutional: She is oriented to person, place, and time. She appears well-developed and well-nourished.  HENT:  Head: Normocephalic and atraumatic.  Eyes: Pupils are equal, round, and reactive to light. Conjunctivae and EOM are normal.  Cardiovascular: Normal rate, regular rhythm and normal heart sounds.  Respiratory: Effort normal and breath sounds normal.  GI: Soft. Bowel sounds are normal.  Musculoskeletal:        General: Normal range of motion.     Cervical back: Normal range of motion and neck supple.  Neurological: She is alert and oriented to person, place, and time. She has normal reflexes.  Skin: Skin is warm and dry.  Psychiatric: She has a normal mood and affect. Judgment and thought content normal.    A Non pregnant female Medical screening exam complete  P Discharge from MAU in stable condition Patient given the option of transfer to [facility] for further evaluation or seek care in outpatient facility of choice List of options for follow-up given  Warning signs for worsening condition that would warrant emergency follow-up discussed Patient may return to MAU as needed for pregnancy related complaints  Khloi Rawl L, DO 11/14/2019 10:52 PM

## 2019-12-21 DIAGNOSIS — F4323 Adjustment disorder with mixed anxiety and depressed mood: Secondary | ICD-10-CM | POA: Diagnosis not present

## 2019-12-26 DIAGNOSIS — F4323 Adjustment disorder with mixed anxiety and depressed mood: Secondary | ICD-10-CM | POA: Diagnosis not present

## 2019-12-27 DIAGNOSIS — F4323 Adjustment disorder with mixed anxiety and depressed mood: Secondary | ICD-10-CM | POA: Diagnosis not present

## 2020-01-04 DIAGNOSIS — F4323 Adjustment disorder with mixed anxiety and depressed mood: Secondary | ICD-10-CM | POA: Diagnosis not present

## 2020-01-23 DIAGNOSIS — N760 Acute vaginitis: Secondary | ICD-10-CM | POA: Diagnosis not present

## 2020-01-23 DIAGNOSIS — Z0000 Encounter for general adult medical examination without abnormal findings: Secondary | ICD-10-CM | POA: Diagnosis not present

## 2020-01-23 DIAGNOSIS — Z124 Encounter for screening for malignant neoplasm of cervix: Secondary | ICD-10-CM | POA: Diagnosis not present

## 2020-01-23 DIAGNOSIS — Z01419 Encounter for gynecological examination (general) (routine) without abnormal findings: Secondary | ICD-10-CM | POA: Diagnosis not present

## 2020-01-23 DIAGNOSIS — Z130 Encounter for screening for diseases of the blood and blood-forming organs and certain disorders involving the immune mechanism: Secondary | ICD-10-CM | POA: Diagnosis not present

## 2020-01-23 DIAGNOSIS — Z9189 Other specified personal risk factors, not elsewhere classified: Secondary | ICD-10-CM | POA: Diagnosis not present

## 2020-01-23 DIAGNOSIS — Z1329 Encounter for screening for other suspected endocrine disorder: Secondary | ICD-10-CM | POA: Diagnosis not present

## 2020-01-23 DIAGNOSIS — Z13220 Encounter for screening for lipoid disorders: Secondary | ICD-10-CM | POA: Diagnosis not present

## 2020-01-23 DIAGNOSIS — E039 Hypothyroidism, unspecified: Secondary | ICD-10-CM | POA: Diagnosis not present

## 2020-01-23 DIAGNOSIS — Z6825 Body mass index (BMI) 25.0-25.9, adult: Secondary | ICD-10-CM | POA: Diagnosis not present

## 2020-01-23 DIAGNOSIS — Z8742 Personal history of other diseases of the female genital tract: Secondary | ICD-10-CM | POA: Diagnosis not present

## 2020-01-23 DIAGNOSIS — R5383 Other fatigue: Secondary | ICD-10-CM | POA: Diagnosis not present

## 2020-01-23 DIAGNOSIS — Z8619 Personal history of other infectious and parasitic diseases: Secondary | ICD-10-CM | POA: Diagnosis not present

## 2020-01-23 DIAGNOSIS — D510 Vitamin B12 deficiency anemia due to intrinsic factor deficiency: Secondary | ICD-10-CM | POA: Diagnosis not present

## 2020-01-23 DIAGNOSIS — N898 Other specified noninflammatory disorders of vagina: Secondary | ICD-10-CM | POA: Diagnosis not present

## 2020-01-25 DIAGNOSIS — F4323 Adjustment disorder with mixed anxiety and depressed mood: Secondary | ICD-10-CM | POA: Diagnosis not present

## 2020-01-27 DIAGNOSIS — Z20828 Contact with and (suspected) exposure to other viral communicable diseases: Secondary | ICD-10-CM | POA: Diagnosis not present

## 2020-01-27 DIAGNOSIS — Z1159 Encounter for screening for other viral diseases: Secondary | ICD-10-CM | POA: Diagnosis not present

## 2020-02-01 DIAGNOSIS — F4323 Adjustment disorder with mixed anxiety and depressed mood: Secondary | ICD-10-CM | POA: Diagnosis not present

## 2020-02-21 DIAGNOSIS — R1031 Right lower quadrant pain: Secondary | ICD-10-CM | POA: Diagnosis not present

## 2020-02-21 DIAGNOSIS — R82998 Other abnormal findings in urine: Secondary | ICD-10-CM | POA: Diagnosis not present

## 2020-02-21 DIAGNOSIS — O26891 Other specified pregnancy related conditions, first trimester: Secondary | ICD-10-CM | POA: Diagnosis not present

## 2020-02-21 DIAGNOSIS — N914 Secondary oligomenorrhea: Secondary | ICD-10-CM | POA: Diagnosis not present

## 2020-02-21 DIAGNOSIS — R102 Pelvic and perineal pain: Secondary | ICD-10-CM | POA: Diagnosis not present

## 2020-02-21 DIAGNOSIS — Z3201 Encounter for pregnancy test, result positive: Secondary | ICD-10-CM | POA: Diagnosis not present

## 2020-03-05 DIAGNOSIS — F4323 Adjustment disorder with mixed anxiety and depressed mood: Secondary | ICD-10-CM | POA: Diagnosis not present

## 2020-03-21 DIAGNOSIS — Z3401 Encounter for supervision of normal first pregnancy, first trimester: Secondary | ICD-10-CM | POA: Diagnosis not present

## 2020-03-21 DIAGNOSIS — O3680X0 Pregnancy with inconclusive fetal viability, not applicable or unspecified: Secondary | ICD-10-CM | POA: Diagnosis not present

## 2020-03-21 DIAGNOSIS — Q5128 Other doubling of uterus, other specified: Secondary | ICD-10-CM | POA: Diagnosis not present

## 2020-03-21 DIAGNOSIS — O3401 Maternal care for unspecified congenital malformation of uterus, first trimester: Secondary | ICD-10-CM | POA: Diagnosis not present

## 2020-03-21 DIAGNOSIS — Z113 Encounter for screening for infections with a predominantly sexual mode of transmission: Secondary | ICD-10-CM | POA: Diagnosis not present

## 2020-03-21 DIAGNOSIS — Z8759 Personal history of other complications of pregnancy, childbirth and the puerperium: Secondary | ICD-10-CM | POA: Diagnosis not present

## 2020-04-06 ENCOUNTER — Ambulatory Visit
Admission: EM | Admit: 2020-04-06 | Discharge: 2020-04-06 | Disposition: A | Discharge status: Home / Self Care | Payer: Federal, State, Local not specified - PPO | Attending: Emergency Medicine | Admitting: Emergency Medicine

## 2020-04-06 DIAGNOSIS — J069 Acute upper respiratory infection, unspecified: Secondary | ICD-10-CM | POA: Diagnosis not present

## 2020-04-06 NOTE — ED Triage Notes (Signed)
Pt c/o cough and congestion xdays. Pt denies having covid vaccine. Pt states she is [redacted]wks pregnant.

## 2020-04-06 NOTE — ED Provider Notes (Signed)
[name]    CSN: [id number] Arrival date & time: 04/06/20  1414      History   Chief Complaint Chief Complaint  Patient presents with  . Cough    HPI Judy Rodgers is a 24 y.o. female with history of environmental allergies, headaches presenting for 2-day course of cough, nasal congestion.  Patient denying headache, chills, fever.  No difficulty breathing, chest pain.  No vomiting, abdominal pain, diarrhea, change in urination.  Patient currently [redacted] weeks pregnant.  Has tried OTC medications with moderate relief.  Has not received Covid vaccine, denies known contacts.   Past Medical History:  Diagnosis Date  . Acne   . Anemia in pregnancy, third trimester 02/02/2018  . Bicornuate uterus   . Environmental allergies   . Headache(784.0)    chronic ha takes motrin 800mg  PRN    Patient Active Problem List   Diagnosis Date Noted  . ASCUS of cervix with negative high risk HPV 06/15/2019  . Iron deficiency anemia due to chronic blood loss 02/02/2018  . Anemia in pregnancy, third trimester 02/02/2018  . Beta thalassemia minor 01/26/2018  . Insomnia 02/02/2014  . Anemia 09/14/2013  . Acne 08/30/2013  . Constipation 08/30/2013  . Irregular menses 08/30/2013  . Migraine without aura and without status migrainosus, not intractable 03/21/2013    Past Surgical History:  Procedure Laterality Date  . TONSILLECTOMY    . TONSILLECTOMY    . WISDOM TOOTH EXTRACTION  09/2015    OB History    Gravida  2   Para  1   Term  1   Preterm  0   AB  0   Living  1     SAB  0   TAB  0   Ectopic  0   Multiple  0   Live Births  1            Home Medications    Prior to Admission medications   Medication Sig Start Date End Date Taking? Authorizing Provider  acetaminophen (TYLENOL) 500 MG tablet Take 1,000 mg by mouth every 8 (eight) hours as needed for mild pain or headache.    [provider]  ibuprofen (ADVIL,MOTRIN) 800 MG tablet Take 1  tablet (800 mg total) by mouth every 8 (eight) hours as needed. 10/07/18   Rasch, 10/09/18 I, NP  Prenatal Multivit-Min-Fe-FA (PRENATAL VITAMINS) 0.8 MG tablet Take 1 tablet by mouth daily. 12/02/17   12/04/17, MD    Family History Family History  Problem Relation Age of Onset  . Heart disease Maternal Grandmother     Social History Social History   Tobacco Use  . Smoking status: Never Smoker  . Smokeless tobacco: Never Used  Substance Use Topics  . Alcohol use: No  . Drug use: No    Types: Other-see comments    Comment: marijuana - maybe in early pregnancy     Allergies   Penicillins, Amoxicillin-pot clavulanate, and Metronidazole   Review of Systems As per HPI   Physical Exam Triage Vital Signs ED Triage Vitals [04/06/20 1432]  Enc Vitals Group     BP 96/62     Pulse Rate 87     Resp 16     Temp 98.6 F (37 C)     Temp Source Oral     SpO2 98 %     Weight      Height      Head Circumference  Peak Flow      Pain Score 0     Pain Loc      Pain Edu?      Excl. in [place]?    No data found.  Updated Vital Signs BP 96/62 (BP Location: Left Arm)   Pulse 87   Temp 98.6 F (37 C) (Oral)   Resp 16   LMP 06/11/2019 (Exact Date)   SpO2 98%   Breastfeeding Yes   Visual Acuity Right Eye Distance:   Left Eye Distance:   Bilateral Distance:    Right Eye Near:   Left Eye Near:    Bilateral Near:     Physical Exam Constitutional:      General: She is not in acute distress.    Appearance: She is obese. She is not ill-appearing or diaphoretic.  HENT:     Head: Normocephalic and atraumatic.     Mouth/Throat:     Mouth: Mucous membranes are moist.     Pharynx: Oropharynx is clear. No oropharyngeal exudate or posterior oropharyngeal erythema.  Eyes:     General: No scleral icterus.    Conjunctiva/sclera: Conjunctivae normal.     Pupils: Pupils are equal, round, and reactive to light.  Neck:     Comments: Trachea midline, negative  JVD Cardiovascular:     Rate and Rhythm: Normal rate and regular rhythm.     Heart sounds: No murmur. No gallop.   Pulmonary:     Effort: Pulmonary effort is normal. No respiratory distress.     Breath sounds: No wheezing, rhonchi or rales.  Musculoskeletal:     Cervical back: Neck supple. No tenderness.  Lymphadenopathy:     Cervical: No cervical adenopathy.  Skin:    Capillary Refill: Capillary refill takes less than 2 seconds.     Coloration: Skin is not jaundiced or pale.     Findings: No rash.  Neurological:     General: No focal deficit present.     Mental Status: She is alert and oriented to person, place, and time.      UC Treatments / Results  Labs (all labs ordered are listed, but only abnormal results are displayed) Labs Reviewed  NOVEL CORONAVIRUS, NAA    EKG   Radiology No results found.  Procedures Procedures (including critical care time)  Medications Ordered in UC Medications - No data to display  Initial Impression / Assessment and Plan / UC Course  I have reviewed the triage vital signs and the nursing notes.  Pertinent labs & imaging results that were available during my care of the patient were reviewed by me and considered in my medical decision making (see chart for details).     Patient afebrile, nontoxic, with SpO2 98%.  Covid PCR pending.  Patient to quarantine until results are back.  We will treat supportively as outlined below.  Return precautions discussed, patient verbalized understanding and is agreeable to plan. Final Clinical Impressions(s) / UC Diagnoses   Final diagnoses:  URI with cough and congestion     Discharge Instructions     Tessalon for cough. Start flonase, atrovent nasal spray for nasal congestion/drainage. You can use over the counter nasal saline rinse such as neti pot for nasal congestion. Keep hydrated, your urine should be clear to pale yellow in color. Tylenol/motrin for fever and pain. Monitor for any  worsening of symptoms, chest pain, shortness of breath, wheezing, swelling of the throat, go to the emergency department for further evaluation needed.  ED Prescriptions    None     PDMP not reviewed this encounter.   [place], [place], [place] 04/06/20 1754

## 2020-04-06 NOTE — Discharge Instructions (Addendum)
Start flonase, atrovent nasal spray for nasal congestion/drainage. You can use over the counter nasal saline rinse such as neti pot for nasal congestion. Keep hydrated, your urine should be clear to pale yellow in color. Tylenol/motrin for fever and pain. Monitor for any worsening of symptoms, chest pain, shortness of breath, wheezing, swelling of the throat, go to the emergency department for further evaluation needed.  

## 2020-04-07 LAB — NOVEL CORONAVIRUS, NAA: SARS-CoV-2, NAA: NOT DETECTED

## 2020-04-07 LAB — SARS-COV-2, NAA 2 DAY TAT

## 2020-04-25 DIAGNOSIS — D563 Thalassemia minor: Secondary | ICD-10-CM | POA: Diagnosis not present

## 2020-04-25 DIAGNOSIS — Q513 Bicornate uterus: Secondary | ICD-10-CM | POA: Diagnosis not present

## 2020-04-25 DIAGNOSIS — O99012 Anemia complicating pregnancy, second trimester: Secondary | ICD-10-CM | POA: Diagnosis not present

## 2020-04-25 DIAGNOSIS — O3402 Maternal care for unspecified congenital malformation of uterus, second trimester: Secondary | ICD-10-CM | POA: Diagnosis not present

## 2020-05-01 DIAGNOSIS — R7989 Other specified abnormal findings of blood chemistry: Secondary | ICD-10-CM | POA: Diagnosis not present

## 2020-05-01 DIAGNOSIS — R946 Abnormal results of thyroid function studies: Secondary | ICD-10-CM | POA: Diagnosis not present

## 2020-05-09 DIAGNOSIS — Z302 Encounter for sterilization: Secondary | ICD-10-CM | POA: Diagnosis not present

## 2020-05-09 DIAGNOSIS — Q513 Bicornate uterus: Secondary | ICD-10-CM | POA: Diagnosis not present

## 2020-05-09 DIAGNOSIS — N760 Acute vaginitis: Secondary | ICD-10-CM | POA: Diagnosis not present

## 2020-05-09 DIAGNOSIS — Z3482 Encounter for supervision of other normal pregnancy, second trimester: Secondary | ICD-10-CM | POA: Diagnosis not present

## 2020-05-30 DIAGNOSIS — D563 Thalassemia minor: Secondary | ICD-10-CM | POA: Diagnosis not present

## 2020-05-30 DIAGNOSIS — O285 Abnormal chromosomal and genetic finding on antenatal screening of mother: Secondary | ICD-10-CM | POA: Diagnosis not present

## 2020-05-30 DIAGNOSIS — Z369 Encounter for antenatal screening, unspecified: Secondary | ICD-10-CM | POA: Diagnosis not present

## 2020-05-30 DIAGNOSIS — Q513 Bicornate uterus: Secondary | ICD-10-CM | POA: Diagnosis not present

## 2020-05-30 DIAGNOSIS — R898 Other abnormal findings in specimens from other organs, systems and tissues: Secondary | ICD-10-CM | POA: Diagnosis not present

## 2020-06-26 DIAGNOSIS — Z113 Encounter for screening for infections with a predominantly sexual mode of transmission: Secondary | ICD-10-CM | POA: Diagnosis not present

## 2020-06-26 DIAGNOSIS — Z3482 Encounter for supervision of other normal pregnancy, second trimester: Secondary | ICD-10-CM | POA: Diagnosis not present

## 2020-06-26 DIAGNOSIS — N898 Other specified noninflammatory disorders of vagina: Secondary | ICD-10-CM | POA: Diagnosis not present

## 2020-06-27 DIAGNOSIS — O0993 Supervision of high risk pregnancy, unspecified, third trimester: Secondary | ICD-10-CM | POA: Diagnosis not present

## 2020-06-27 DIAGNOSIS — Q513 Bicornate uterus: Secondary | ICD-10-CM | POA: Diagnosis not present

## 2020-07-19 DIAGNOSIS — L905 Scar conditions and fibrosis of skin: Secondary | ICD-10-CM | POA: Diagnosis not present

## 2020-07-19 DIAGNOSIS — L732 Hidradenitis suppurativa: Secondary | ICD-10-CM | POA: Diagnosis not present

## 2020-07-25 DIAGNOSIS — Q513 Bicornate uterus: Secondary | ICD-10-CM | POA: Diagnosis not present

## 2020-07-25 DIAGNOSIS — O365930 Maternal care for other known or suspected poor fetal growth, third trimester, not applicable or unspecified: Secondary | ICD-10-CM | POA: Diagnosis not present

## 2020-07-25 DIAGNOSIS — O0993 Supervision of high risk pregnancy, unspecified, third trimester: Secondary | ICD-10-CM | POA: Diagnosis not present

## 2020-07-30 ENCOUNTER — Ambulatory Visit
Admission: EM | Admit: 2020-07-30 | Discharge: 2020-07-30 | Disposition: A | Discharge status: Home / Self Care | Payer: Federal, State, Local not specified - PPO | Attending: Physician Assistant | Admitting: Physician Assistant

## 2020-07-30 DIAGNOSIS — Z1152 Encounter for screening for COVID-19: Secondary | ICD-10-CM | POA: Diagnosis not present

## 2020-07-30 NOTE — Discharge Instructions (Signed)

## 2020-07-30 NOTE — ED Triage Notes (Signed)
Unable to get through on phone.

## 2020-07-30 NOTE — ED Triage Notes (Signed)
Requesting covid testing, no sx's

## 2020-08-01 LAB — NOVEL CORONAVIRUS, NAA: SARS-CoV-2, NAA: NOT DETECTED

## 2020-08-01 LAB — SARS-COV-2, NAA 2 DAY TAT

## 2020-08-02 DIAGNOSIS — Z3A29 29 weeks gestation of pregnancy: Secondary | ICD-10-CM | POA: Diagnosis not present

## 2020-08-02 DIAGNOSIS — Z3482 Encounter for supervision of other normal pregnancy, second trimester: Secondary | ICD-10-CM | POA: Diagnosis not present

## 2020-08-02 DIAGNOSIS — O365930 Maternal care for other known or suspected poor fetal growth, third trimester, not applicable or unspecified: Secondary | ICD-10-CM | POA: Diagnosis not present

## 2020-08-02 DIAGNOSIS — Z23 Encounter for immunization: Secondary | ICD-10-CM | POA: Diagnosis not present

## 2020-08-06 DIAGNOSIS — Q513 Bicornate uterus: Secondary | ICD-10-CM | POA: Diagnosis not present

## 2020-08-06 DIAGNOSIS — O43103 Malformation of placenta, unspecified, third trimester: Secondary | ICD-10-CM | POA: Diagnosis not present

## 2020-08-06 DIAGNOSIS — O365930 Maternal care for other known or suspected poor fetal growth, third trimester, not applicable or unspecified: Secondary | ICD-10-CM | POA: Diagnosis not present

## 2020-08-06 DIAGNOSIS — Z3A30 30 weeks gestation of pregnancy: Secondary | ICD-10-CM | POA: Diagnosis not present

## 2020-08-10 DIAGNOSIS — O365930 Maternal care for other known or suspected poor fetal growth, third trimester, not applicable or unspecified: Secondary | ICD-10-CM | POA: Diagnosis not present

## 2020-08-10 DIAGNOSIS — O0993 Supervision of high risk pregnancy, unspecified, third trimester: Secondary | ICD-10-CM | POA: Diagnosis not present

## 2020-08-14 DIAGNOSIS — O0993 Supervision of high risk pregnancy, unspecified, third trimester: Secondary | ICD-10-CM | POA: Diagnosis not present

## 2020-08-14 DIAGNOSIS — O365930 Maternal care for other known or suspected poor fetal growth, third trimester, not applicable or unspecified: Secondary | ICD-10-CM | POA: Diagnosis not present

## 2020-08-17 DIAGNOSIS — O0993 Supervision of high risk pregnancy, unspecified, third trimester: Secondary | ICD-10-CM | POA: Diagnosis not present

## 2020-08-17 DIAGNOSIS — O365930 Maternal care for other known or suspected poor fetal growth, third trimester, not applicable or unspecified: Secondary | ICD-10-CM | POA: Diagnosis not present

## 2020-08-21 DIAGNOSIS — O365930 Maternal care for other known or suspected poor fetal growth, third trimester, not applicable or unspecified: Secondary | ICD-10-CM | POA: Diagnosis not present

## 2020-08-21 DIAGNOSIS — O0993 Supervision of high risk pregnancy, unspecified, third trimester: Secondary | ICD-10-CM | POA: Diagnosis not present

## 2020-08-24 DIAGNOSIS — O0993 Supervision of high risk pregnancy, unspecified, third trimester: Secondary | ICD-10-CM | POA: Diagnosis not present

## 2020-08-24 DIAGNOSIS — O365930 Maternal care for other known or suspected poor fetal growth, third trimester, not applicable or unspecified: Secondary | ICD-10-CM | POA: Diagnosis not present

## 2020-08-28 DIAGNOSIS — O368130 Decreased fetal movements, third trimester, not applicable or unspecified: Secondary | ICD-10-CM | POA: Diagnosis not present

## 2020-08-28 DIAGNOSIS — Q513 Bicornate uterus: Secondary | ICD-10-CM | POA: Diagnosis not present

## 2020-08-28 DIAGNOSIS — O43103 Malformation of placenta, unspecified, third trimester: Secondary | ICD-10-CM | POA: Diagnosis not present

## 2020-08-28 DIAGNOSIS — Z369 Encounter for antenatal screening, unspecified: Secondary | ICD-10-CM | POA: Diagnosis not present

## 2020-08-28 DIAGNOSIS — O365930 Maternal care for other known or suspected poor fetal growth, third trimester, not applicable or unspecified: Secondary | ICD-10-CM | POA: Diagnosis not present

## 2020-08-30 DIAGNOSIS — Z6720 Type B blood, Rh positive: Secondary | ICD-10-CM | POA: Diagnosis not present

## 2020-08-30 DIAGNOSIS — Z888 Allergy status to other drugs, medicaments and biological substances status: Secondary | ICD-10-CM | POA: Diagnosis not present

## 2020-08-30 DIAGNOSIS — Z880 Allergy status to penicillin: Secondary | ICD-10-CM | POA: Diagnosis not present

## 2020-08-30 DIAGNOSIS — Z3A33 33 weeks gestation of pregnancy: Secondary | ICD-10-CM | POA: Diagnosis not present

## 2020-08-30 DIAGNOSIS — Z881 Allergy status to other antibiotic agents status: Secondary | ICD-10-CM | POA: Diagnosis not present

## 2020-08-30 DIAGNOSIS — Z8759 Personal history of other complications of pregnancy, childbirth and the puerperium: Secondary | ICD-10-CM | POA: Diagnosis not present

## 2020-08-30 DIAGNOSIS — O321XX0 Maternal care for breech presentation, not applicable or unspecified: Secondary | ICD-10-CM | POA: Diagnosis not present

## 2020-08-30 DIAGNOSIS — Z20822 Contact with and (suspected) exposure to covid-19: Secondary | ICD-10-CM | POA: Diagnosis not present

## 2020-08-30 DIAGNOSIS — Z5329 Procedure and treatment not carried out because of patient's decision for other reasons: Secondary | ICD-10-CM | POA: Diagnosis not present

## 2020-08-30 DIAGNOSIS — O365930 Maternal care for other known or suspected poor fetal growth, third trimester, not applicable or unspecified: Secondary | ICD-10-CM | POA: Diagnosis not present

## 2020-08-31 DIAGNOSIS — O365930 Maternal care for other known or suspected poor fetal growth, third trimester, not applicable or unspecified: Secondary | ICD-10-CM | POA: Diagnosis not present

## 2020-08-31 DIAGNOSIS — Q513 Bicornate uterus: Secondary | ICD-10-CM | POA: Diagnosis not present

## 2020-09-02 DIAGNOSIS — Z880 Allergy status to penicillin: Secondary | ICD-10-CM | POA: Diagnosis not present

## 2020-09-02 DIAGNOSIS — Z3A33 33 weeks gestation of pregnancy: Secondary | ICD-10-CM | POA: Diagnosis not present

## 2020-09-02 DIAGNOSIS — Z302 Encounter for sterilization: Secondary | ICD-10-CM | POA: Diagnosis not present

## 2020-09-02 DIAGNOSIS — Z881 Allergy status to other antibiotic agents status: Secondary | ICD-10-CM | POA: Diagnosis not present

## 2020-09-02 DIAGNOSIS — O321XX0 Maternal care for breech presentation, not applicable or unspecified: Secondary | ICD-10-CM | POA: Diagnosis not present

## 2020-09-02 DIAGNOSIS — D563 Thalassemia minor: Secondary | ICD-10-CM | POA: Diagnosis not present

## 2020-09-02 DIAGNOSIS — O365930 Maternal care for other known or suspected poor fetal growth, third trimester, not applicable or unspecified: Secondary | ICD-10-CM | POA: Diagnosis not present

## 2020-09-02 DIAGNOSIS — O365990 Maternal care for other known or suspected poor fetal growth, unspecified trimester, not applicable or unspecified: Secondary | ICD-10-CM | POA: Diagnosis not present

## 2020-09-02 DIAGNOSIS — Z79899 Other long term (current) drug therapy: Secondary | ICD-10-CM | POA: Diagnosis not present

## 2020-09-03 DIAGNOSIS — Z3A34 34 weeks gestation of pregnancy: Secondary | ICD-10-CM | POA: Diagnosis not present

## 2020-09-03 DIAGNOSIS — O321XX0 Maternal care for breech presentation, not applicable or unspecified: Secondary | ICD-10-CM | POA: Diagnosis not present

## 2020-09-03 DIAGNOSIS — O365930 Maternal care for other known or suspected poor fetal growth, third trimester, not applicable or unspecified: Secondary | ICD-10-CM | POA: Diagnosis not present

## 2020-09-03 DIAGNOSIS — Z302 Encounter for sterilization: Secondary | ICD-10-CM | POA: Diagnosis not present

## 2020-09-03 DIAGNOSIS — O365990 Maternal care for other known or suspected poor fetal growth, unspecified trimester, not applicable or unspecified: Secondary | ICD-10-CM | POA: Diagnosis not present

## 2020-09-25 DIAGNOSIS — Z1152 Encounter for screening for COVID-19: Secondary | ICD-10-CM | POA: Diagnosis not present

## 2020-09-25 DIAGNOSIS — R067 Sneezing: Secondary | ICD-10-CM | POA: Diagnosis not present

## 2020-10-16 DIAGNOSIS — N760 Acute vaginitis: Secondary | ICD-10-CM | POA: Diagnosis not present

## 2021-09-08 ENCOUNTER — Encounter (HOSPITAL_BASED_OUTPATIENT_CLINIC_OR_DEPARTMENT_OTHER): Payer: Self-pay | Admitting: Obstetrics and Gynecology

## 2021-09-08 ENCOUNTER — Other Ambulatory Visit: Payer: Self-pay

## 2021-09-08 DIAGNOSIS — R519 Headache, unspecified: Secondary | ICD-10-CM | POA: Insufficient documentation

## 2021-09-08 DIAGNOSIS — Z20822 Contact with and (suspected) exposure to covid-19: Secondary | ICD-10-CM | POA: Diagnosis not present

## 2021-09-08 DIAGNOSIS — S161XXA Strain of muscle, fascia and tendon at neck level, initial encounter: Secondary | ICD-10-CM | POA: Diagnosis not present

## 2021-09-08 DIAGNOSIS — D649 Anemia, unspecified: Secondary | ICD-10-CM | POA: Diagnosis not present

## 2021-09-08 DIAGNOSIS — X500XXA Overexertion from strenuous movement or load, initial encounter: Secondary | ICD-10-CM | POA: Insufficient documentation

## 2021-09-08 DIAGNOSIS — S199XXA Unspecified injury of neck, initial encounter: Secondary | ICD-10-CM | POA: Diagnosis present

## 2021-09-08 DIAGNOSIS — S4991XA Unspecified injury of right shoulder and upper arm, initial encounter: Secondary | ICD-10-CM | POA: Insufficient documentation

## 2021-09-08 LAB — CBC WITH DIFFERENTIAL/PLATELET
Abs Immature Granulocytes: 0 10*3/uL (ref 0.00–0.07)
Basophils Absolute: 0 10*3/uL (ref 0.0–0.1)
Basophils Relative: 1 %
Eosinophils Absolute: 0.4 10*3/uL (ref 0.0–0.5)
Eosinophils Relative: 8 %
HCT: 26.5 % — ABNORMAL LOW (ref 36.0–46.0)
Hemoglobin: 7.9 g/dL — ABNORMAL LOW (ref 12.0–15.0)
Immature Granulocytes: 0 %
Lymphocytes Relative: 39 %
Lymphs Abs: 2.1 10*3/uL (ref 0.7–4.0)
MCH: 20.1 pg — ABNORMAL LOW (ref 26.0–34.0)
MCHC: 29.8 g/dL — ABNORMAL LOW (ref 30.0–36.0)
MCV: 67.3 fL — ABNORMAL LOW (ref 80.0–100.0)
Monocytes Absolute: 0.5 10*3/uL (ref 0.1–1.0)
Monocytes Relative: 9 %
Neutro Abs: 2.3 10*3/uL (ref 1.7–7.7)
Neutrophils Relative %: 43 %
Platelets: 269 10*3/uL (ref 150–400)
RBC: 3.94 MIL/uL (ref 3.87–5.11)
RDW: 21.1 % — ABNORMAL HIGH (ref 11.5–15.5)
WBC: 5.3 10*3/uL (ref 4.0–10.5)
nRBC: 0 % (ref 0.0–0.2)

## 2021-09-08 LAB — COMPREHENSIVE METABOLIC PANEL
ALT: 6 U/L (ref 0–44)
AST: 10 U/L — ABNORMAL LOW (ref 15–41)
Albumin: 4.2 g/dL (ref 3.5–5.0)
Alkaline Phosphatase: 50 U/L (ref 38–126)
Anion gap: 6 (ref 5–15)
BUN: 9 mg/dL (ref 6–20)
CO2: 26 mmol/L (ref 22–32)
Calcium: 8.8 mg/dL — ABNORMAL LOW (ref 8.9–10.3)
Chloride: 103 mmol/L (ref 98–111)
Creatinine, Ser: 0.91 mg/dL (ref 0.44–1.00)
GFR, Estimated: >60 mL/min (ref >60)
Glucose, Bld: 79 mg/dL (ref 70–99)
Potassium: 3.6 mmol/L (ref 3.5–5.1)
Sodium: 135 mmol/L (ref 135–145)
Total Bilirubin: 0.2 mg/dL — ABNORMAL LOW (ref 0.3–1.2)
Total Protein: 7.2 g/dL (ref 6.5–8.1)

## 2021-09-08 NOTE — ED Triage Notes (Signed)
Patient reports x2 weeks of head, neck and shoulder pain. Patient reports lifint her daughter is painful and working or bending is painful. Patient states her daughter is only 30lbs and feels heavy to her.

## 2021-09-09 ENCOUNTER — Emergency Department (HOSPITAL_BASED_OUTPATIENT_CLINIC_OR_DEPARTMENT_OTHER)
Admission: EM | Admit: 2021-09-09 | Discharge: 2021-09-09 | Disposition: A | Discharge status: Home / Self Care | Payer: Federal, State, Local not specified - PPO | Attending: Emergency Medicine | Admitting: Emergency Medicine

## 2021-09-09 DIAGNOSIS — D649 Anemia, unspecified: Secondary | ICD-10-CM

## 2021-09-09 DIAGNOSIS — S161XXA Strain of muscle, fascia and tendon at neck level, initial encounter: Secondary | ICD-10-CM

## 2021-09-09 HISTORY — DX: Anemia, unspecified: D64.9

## 2021-09-09 LAB — RESP PANEL BY RT-PCR (FLU A&B, COVID) ARPGX2
Influenza A by PCR: NEGATIVE
Influenza B by PCR: NEGATIVE
SARS Coronavirus 2 by RT PCR: NEGATIVE

## 2021-09-09 MED ORDER — CYCLOBENZAPRINE HCL 5 MG PO TABS
5.0000 mg | ORAL_TABLET | Freq: Once | ORAL | Status: AC
  Administered 2021-09-09: 5 mg via ORAL
  Filled 2021-09-09: qty 1

## 2021-09-09 MED ORDER — CYCLOBENZAPRINE HCL 5 MG PO TABS: 10.0000 mg | ORAL_TABLET | Freq: Two times a day (BID) | ORAL | 0 refills | Status: DC | PRN

## 2021-09-09 MED ORDER — NAPROXEN 500 MG PO TABS: 500.0000 mg | ORAL_TABLET | Freq: Two times a day (BID) | ORAL | 0 refills | Status: DC

## 2021-09-09 MED ORDER — KETOROLAC TROMETHAMINE 30 MG/ML IJ SOLN
30.0000 mg | Freq: Once | INTRAMUSCULAR | Status: AC
  Administered 2021-09-09: 30 mg via INTRAMUSCULAR
  Filled 2021-09-09: qty 1

## 2021-09-09 NOTE — ED Provider Notes (Signed)
[facility] EMERGENCY DEPT Provider Note   CSN: [id number] Arrival date & time: 09/08/21  2241     History Chief Complaint  Patient presents with   Neck Pain   Headache    Judy Rodgers is a 25 y.o. female.  HPI     This is a 25 year old female with a history of anemia and headaches who presents with right-sided neck and shoulder pain.  Patient reports several week history of worsening right-sided neck and shoulder pain.  She states she is right-handed.  She has significant pain with lifting her daughter or with certain movements.  She states that when she is sitting still she is not in any pain but when she does heavy lifting she is in 7-8 out of 10 pain.  She has taken some ibuprofen with relief.  She has associated headache and occasional radiation of the pain into the chest.  Denies any shortness of breath, fevers.  Denies any specific injury.  Denies weakness, numbness, tingling of the upper extremities.  Patient reports history of anemia.  She has follow-up in November.  She required iron supplements while pregnant.  She is not currently on any iron supplements.  States she has had some increasing fatigue.  Denies heavy periods or rectal bleeding.  Past Medical History:  Diagnosis Date   Acne    Anemia    Anemia in pregnancy, third trimester 02/02/2018   Bicornuate uterus    Environmental allergies    Headache(784.0)    chronic ha takes motrin 800mg  PRN    Patient Active Problem List   Diagnosis Date Noted   ASCUS of cervix with negative high risk HPV 06/15/2019   Iron deficiency anemia due to chronic blood loss 02/02/2018   Anemia in pregnancy, third trimester 02/02/2018   Beta thalassemia minor 01/26/2018   Insomnia 02/02/2014   Anemia 09/14/2013   Acne 08/30/2013   Constipation 08/30/2013   Irregular menses 08/30/2013   Migraine without aura and without status migrainosus, not intractable 03/21/2013    Past Surgical History:  Procedure  Laterality Date   TONSILLECTOMY     TONSILLECTOMY     WISDOM TOOTH EXTRACTION  09/2015     OB History     Gravida  2   Para  1   Term  1   Preterm  0   AB  0   Living  1      SAB  0   IAB  0   Ectopic  0   Multiple  0   Live Births  1           Family History  Problem Relation Age of Onset   Heart disease Maternal Grandmother     Social History   Tobacco Use   Smoking status: Never   Smokeless tobacco: Never  Vaping Use   Vaping Use: Never used  Substance Use Topics   Alcohol use: No   Drug use: No    Types: Other-see comments    Comment: marijuana - maybe in early pregnancy    Home Medications Prior to Admission medications   Medication Sig Start Date End Date Taking? Authorizing Provider  cyclobenzaprine (FLEXERIL) 5 MG tablet Take 2 tablets (10 mg total) by mouth 2 (two) times daily as needed for muscle spasms. 09/09/21  Yes Joas Motton, 09/11/21, MD  naproxen (NAPROSYN) 500 MG tablet Take 1 tablet (500 mg total) by mouth 2 (two) times daily. 09/09/21  Yes Lauren Aguayo, 09/11/21, MD  acetaminophen (  TYLENOL) 500 MG tablet Take 1,000 mg by mouth every 8 (eight) hours as needed for mild pain or headache.    [provider]  ibuprofen (ADVIL,MOTRIN) 800 MG tablet Take 1 tablet (800 mg total) by mouth every 8 (eight) hours as needed. 10/07/18   Rasch, Victorino Dike I, NP  Prenatal Multivit-Min-Fe-FA (PRENATAL VITAMINS) 0.8 MG tablet Take 1 tablet by mouth daily. 12/02/17   Adam Phenix, MD    Allergies    Penicillins, Amoxicillin-pot clavulanate, and Metronidazole  Review of Systems   Review of Systems  Constitutional:  Negative for fever.  Respiratory:  Negative for shortness of breath.   Cardiovascular:  Negative for chest pain.  Gastrointestinal:  Negative for abdominal pain, nausea and vomiting.  Musculoskeletal:  Positive for neck pain. Negative for back pain and neck stiffness.  Neurological:  Positive for headaches. Negative for weakness  and numbness.  All other systems reviewed and are negative.  Physical Exam Updated Vital Signs BP 122/66 (BP Location: Right Arm)   Pulse 76   Temp 98.7 F (37.1 C)   Resp 16   SpO2 97%   Breastfeeding No   Physical Exam Vitals and nursing note reviewed.  Constitutional:      Appearance: She is well-developed. She is not ill-appearing.  HENT:     Head: Normocephalic and atraumatic.     Mouth/Throat:     Mouth: Mucous membranes are moist.  Eyes:     Pupils: Pupils are equal, round, and reactive to light.  Neck:     Comments: Range of motion of the neck limited with axial range of motion to the right, there is tenderness palpation along the sternocleidomastoid and upper trapezius into the right shoulder, no midline tenderness to palpation, step-off, deformity Cardiovascular:     Rate and Rhythm: Normal rate and regular rhythm.  Pulmonary:     Effort: Pulmonary effort is normal. No respiratory distress.  Abdominal:     Palpations: Abdomen is soft.  Musculoskeletal:     Cervical back: Neck supple.     Comments: Tenderness palpation right upper trapezius  Skin:    General: Skin is warm and dry.  Neurological:     Mental Status: She is alert and oriented to person, place, and time.     Comments: 5 out of 5 strength bilateral upper extremities with grip strength, deltoid, triceps, biceps  Psychiatric:        Mood and Affect: Mood normal.    ED Results / Procedures / Treatments   Labs (all labs ordered are listed, but only abnormal results are displayed) Labs Reviewed  CBC WITH DIFFERENTIAL/PLATELET - Abnormal; Notable for the following components:      Result Value   Hemoglobin 7.9 (*)    HCT 26.5 (*)    MCV 67.3 (*)    MCH 20.1 (*)    MCHC 29.8 (*)    RDW 21.1 (*)    All other components within normal limits  COMPREHENSIVE METABOLIC PANEL - Abnormal; Notable for the following components:   Calcium 8.8 (*)    AST 10 (*)    Total Bilirubin 0.2 (*)    All other  components within normal limits  RESP PANEL BY RT-PCR (FLU A&B, COVID) ARPGX2    EKG None  Radiology No results found.  Procedures Procedures   Medications Ordered in ED Medications  ketorolac (TORADOL) 30 MG/ML injection 30 mg (has no administration in time range)  cyclobenzaprine (FLEXERIL) tablet 5 mg (has no administration in  time range)    ED Course  I have reviewed the triage vital signs and the nursing notes.  Pertinent labs & imaging results that were available during my care of the patient were reviewed by me and considered in my medical decision making (see chart for details).    MDM Rules/Calculators/A&P                           Patient presents with neck pain and right shoulder pain.  She is overall nontoxic and vital signs are reassuring.  Noted lowerShe is nontoxic-appearing and vital signs are reassuring.  No neurologic deficits noted.  She has reproducible pain suggestive of musculoskeletal etiology.  Lower suspicion for cervical spine etiology such as a herniated disc.  She is otherwise nontoxic.  Lab work was sent from triage.  She is notably anemic with a hemoglobin of 7.9.  She believes her last hemoglobin was 11.  This may explain some ongoing fatigue.  She has a significant history of anemia and is currently asymptomatic without any acute decompensation or vital sign abnormalities.  She has appropriate follow-up.  Will defer work-up at this time.  Patient was advised of her hemoglobin of 7.9.  She will follow-up as scheduled.  Regarding her neck, will trial with scheduled NSAIDs and as needed muscle relaxants.  She was advised not to drive while taking muscle relaxants.  After history, exam, and medical workup I feel the patient has been appropriately medically screened and is safe for discharge home. Pertinent diagnoses were discussed with the patient. Patient was given return precautions.  Final Clinical Impression(s) / ED Diagnoses Final diagnoses:  Acute  strain of neck muscle, initial encounter  Anemia, unspecified type    Rx / DC Orders ED Discharge Orders          Ordered    cyclobenzaprine (FLEXERIL) 5 MG tablet  2 times daily PRN        09/09/21 0103    naproxen (NAPROSYN) 500 MG tablet  2 times daily        09/09/21 0103             Kree Armato, Mayer Masker, MD 09/09/21 0110

## 2021-09-09 NOTE — ED Notes (Signed)
Pt verbalizes understanding of discharge instructions. Opportunity for questioning and answers were provided. Judy Rodgers removed by staff, pt discharged from ED to home. Educated to pick up Rx.  

## 2021-09-09 NOTE — Discharge Instructions (Signed)
You were seen today for right-sided shoulder neck pain.  This is likely related to muscle strain.  Make sure that you are stretching appropriately.  Take naproxen daily.  Use Flexeril as needed for muscle relaxation.  Do not drive while taking Flexeril.  Try to avoid heavy lifting with the right arm until you are feeling better.  Additionally, you were noted to be anemic.  Follow-up as scheduled in November.  You may need to start iron supplementations.

## 2021-09-10 ENCOUNTER — Telehealth: Payer: Self-pay

## 2021-09-10 NOTE — Telephone Encounter (Signed)
Transition Care Management Unsuccessful Follow-up Telephone Call  Date of discharge and from where:  09/09/2021-[facility]  Attempts:  1st Attempt  Reason for unsuccessful TCM follow-up call:  Left voice message

## 2021-09-11 NOTE — Telephone Encounter (Signed)
Transition Care Management Unsuccessful Follow-up Telephone Call  Date of discharge and from where:  09/09/2021-[facility]  Attempts:  2nd Attempt  Reason for unsuccessful TCM follow-up call:  Left voice message

## 2021-09-12 NOTE — Telephone Encounter (Signed)
Transition Care Management Unsuccessful Follow-up Telephone Call  Date of discharge and from where:  09/09/2021-[facility]  Attempts:  3rd Attempt  Reason for unsuccessful TCM follow-up call:  Left voice message

## 2021-09-26 DIAGNOSIS — R5383 Other fatigue: Secondary | ICD-10-CM | POA: Diagnosis not present

## 2021-09-26 DIAGNOSIS — G43019 Migraine without aura, intractable, without status migrainosus: Secondary | ICD-10-CM | POA: Diagnosis not present

## 2021-09-26 DIAGNOSIS — Z6823 Body mass index (BMI) 23.0-23.9, adult: Secondary | ICD-10-CM | POA: Diagnosis not present

## 2021-09-26 DIAGNOSIS — D509 Iron deficiency anemia, unspecified: Secondary | ICD-10-CM | POA: Diagnosis not present

## 2021-09-26 DIAGNOSIS — Z0000 Encounter for general adult medical examination without abnormal findings: Secondary | ICD-10-CM | POA: Diagnosis not present

## 2021-12-12 DIAGNOSIS — J069 Acute upper respiratory infection, unspecified: Secondary | ICD-10-CM | POA: Diagnosis not present

## 2021-12-12 DIAGNOSIS — Z20822 Contact with and (suspected) exposure to covid-19: Secondary | ICD-10-CM | POA: Diagnosis not present

## 2022-02-06 DIAGNOSIS — Z0000 Encounter for general adult medical examination without abnormal findings: Secondary | ICD-10-CM | POA: Diagnosis not present

## 2022-02-06 DIAGNOSIS — E559 Vitamin D deficiency, unspecified: Secondary | ICD-10-CM | POA: Diagnosis not present

## 2022-02-06 DIAGNOSIS — J309 Allergic rhinitis, unspecified: Secondary | ICD-10-CM | POA: Diagnosis not present

## 2022-02-06 DIAGNOSIS — D563 Thalassemia minor: Secondary | ICD-10-CM | POA: Diagnosis not present

## 2022-02-06 DIAGNOSIS — G479 Sleep disorder, unspecified: Secondary | ICD-10-CM | POA: Diagnosis not present

## 2022-02-06 DIAGNOSIS — R5383 Other fatigue: Secondary | ICD-10-CM | POA: Diagnosis not present

## 2022-02-06 DIAGNOSIS — G43019 Migraine without aura, intractable, without status migrainosus: Secondary | ICD-10-CM | POA: Diagnosis not present

## 2022-02-06 DIAGNOSIS — G4719 Other hypersomnia: Secondary | ICD-10-CM | POA: Diagnosis not present

## 2022-02-06 DIAGNOSIS — D509 Iron deficiency anemia, unspecified: Secondary | ICD-10-CM | POA: Diagnosis not present

## 2022-02-06 DIAGNOSIS — Z113 Encounter for screening for infections with a predominantly sexual mode of transmission: Secondary | ICD-10-CM | POA: Diagnosis not present

## 2022-02-06 DIAGNOSIS — Z6825 Body mass index (BMI) 25.0-25.9, adult: Secondary | ICD-10-CM | POA: Diagnosis not present

## 2022-04-10 DIAGNOSIS — M545 Low back pain, unspecified: Secondary | ICD-10-CM | POA: Diagnosis not present

## 2022-04-10 DIAGNOSIS — N898 Other specified noninflammatory disorders of vagina: Secondary | ICD-10-CM | POA: Diagnosis not present

## 2022-04-10 DIAGNOSIS — R10819 Abdominal tenderness, unspecified site: Secondary | ICD-10-CM | POA: Diagnosis not present

## 2022-04-10 DIAGNOSIS — Z202 Contact with and (suspected) exposure to infections with a predominantly sexual mode of transmission: Secondary | ICD-10-CM | POA: Diagnosis not present

## 2022-09-18 DIAGNOSIS — E559 Vitamin D deficiency, unspecified: Secondary | ICD-10-CM | POA: Diagnosis not present

## 2022-09-18 DIAGNOSIS — Z113 Encounter for screening for infections with a predominantly sexual mode of transmission: Secondary | ICD-10-CM | POA: Diagnosis not present

## 2022-09-18 DIAGNOSIS — R5383 Other fatigue: Secondary | ICD-10-CM | POA: Diagnosis not present

## 2022-09-18 DIAGNOSIS — G43009 Migraine without aura, not intractable, without status migrainosus: Secondary | ICD-10-CM | POA: Diagnosis not present

## 2022-09-18 DIAGNOSIS — L732 Hidradenitis suppurativa: Secondary | ICD-10-CM | POA: Diagnosis not present

## 2022-09-18 DIAGNOSIS — D509 Iron deficiency anemia, unspecified: Secondary | ICD-10-CM | POA: Diagnosis not present

## 2022-09-18 DIAGNOSIS — D563 Thalassemia minor: Secondary | ICD-10-CM | POA: Diagnosis not present

## 2022-10-07 DIAGNOSIS — Z113 Encounter for screening for infections with a predominantly sexual mode of transmission: Secondary | ICD-10-CM | POA: Diagnosis not present

## 2022-10-07 DIAGNOSIS — A749 Chlamydial infection, unspecified: Secondary | ICD-10-CM | POA: Diagnosis not present

## 2022-10-07 DIAGNOSIS — R350 Frequency of micturition: Secondary | ICD-10-CM | POA: Diagnosis not present

## 2022-10-07 DIAGNOSIS — R82998 Other abnormal findings in urine: Secondary | ICD-10-CM | POA: Diagnosis not present

## 2022-10-17 DIAGNOSIS — L700 Acne vulgaris: Secondary | ICD-10-CM | POA: Diagnosis not present

## 2022-10-17 DIAGNOSIS — L810 Postinflammatory hyperpigmentation: Secondary | ICD-10-CM | POA: Diagnosis not present

## 2022-10-17 DIAGNOSIS — L732 Hidradenitis suppurativa: Secondary | ICD-10-CM | POA: Diagnosis not present

## 2022-10-28 DIAGNOSIS — Z79899 Other long term (current) drug therapy: Secondary | ICD-10-CM | POA: Diagnosis not present

## 2022-10-28 DIAGNOSIS — L400 Psoriasis vulgaris: Secondary | ICD-10-CM | POA: Diagnosis not present

## 2022-10-28 DIAGNOSIS — R531 Weakness: Secondary | ICD-10-CM | POA: Diagnosis not present

## 2022-10-28 DIAGNOSIS — Z111 Encounter for screening for respiratory tuberculosis: Secondary | ICD-10-CM | POA: Diagnosis not present

## 2022-11-03 DIAGNOSIS — J069 Acute upper respiratory infection, unspecified: Secondary | ICD-10-CM | POA: Diagnosis not present

## 2022-11-03 DIAGNOSIS — J029 Acute pharyngitis, unspecified: Secondary | ICD-10-CM | POA: Diagnosis not present

## 2022-12-17 DIAGNOSIS — J309 Allergic rhinitis, unspecified: Secondary | ICD-10-CM | POA: Diagnosis not present

## 2022-12-17 DIAGNOSIS — D563 Thalassemia minor: Secondary | ICD-10-CM | POA: Diagnosis not present

## 2022-12-17 DIAGNOSIS — R630 Anorexia: Secondary | ICD-10-CM | POA: Diagnosis not present

## 2022-12-17 DIAGNOSIS — R634 Abnormal weight loss: Secondary | ICD-10-CM | POA: Diagnosis not present

## 2022-12-17 DIAGNOSIS — R7989 Other specified abnormal findings of blood chemistry: Secondary | ICD-10-CM | POA: Diagnosis not present

## 2022-12-17 DIAGNOSIS — G43009 Migraine without aura, not intractable, without status migrainosus: Secondary | ICD-10-CM | POA: Diagnosis not present

## 2022-12-17 DIAGNOSIS — E559 Vitamin D deficiency, unspecified: Secondary | ICD-10-CM | POA: Diagnosis not present

## 2022-12-17 DIAGNOSIS — F3341 Major depressive disorder, recurrent, in partial remission: Secondary | ICD-10-CM | POA: Diagnosis not present

## 2022-12-17 DIAGNOSIS — D509 Iron deficiency anemia, unspecified: Secondary | ICD-10-CM | POA: Diagnosis not present

## 2022-12-17 DIAGNOSIS — F411 Generalized anxiety disorder: Secondary | ICD-10-CM | POA: Diagnosis not present

## 2022-12-17 DIAGNOSIS — L732 Hidradenitis suppurativa: Secondary | ICD-10-CM | POA: Diagnosis not present

## 2022-12-18 DIAGNOSIS — L732 Hidradenitis suppurativa: Secondary | ICD-10-CM | POA: Diagnosis not present

## 2022-12-25 DIAGNOSIS — L732 Hidradenitis suppurativa: Secondary | ICD-10-CM | POA: Diagnosis not present

## 2023-01-07 DIAGNOSIS — Z202 Contact with and (suspected) exposure to infections with a predominantly sexual mode of transmission: Secondary | ICD-10-CM | POA: Diagnosis not present

## 2023-01-07 DIAGNOSIS — N898 Other specified noninflammatory disorders of vagina: Secondary | ICD-10-CM | POA: Diagnosis not present

## 2023-01-09 DIAGNOSIS — L732 Hidradenitis suppurativa: Secondary | ICD-10-CM | POA: Diagnosis not present

## 2023-01-16 DIAGNOSIS — L732 Hidradenitis suppurativa: Secondary | ICD-10-CM | POA: Diagnosis not present

## 2023-01-23 DIAGNOSIS — L732 Hidradenitis suppurativa: Secondary | ICD-10-CM | POA: Diagnosis not present

## 2023-01-26 DIAGNOSIS — L732 Hidradenitis suppurativa: Secondary | ICD-10-CM | POA: Diagnosis not present

## 2023-01-26 DIAGNOSIS — L700 Acne vulgaris: Secondary | ICD-10-CM | POA: Diagnosis not present

## 2023-03-05 DIAGNOSIS — J302 Other seasonal allergic rhinitis: Secondary | ICD-10-CM | POA: Diagnosis not present

## 2023-03-05 DIAGNOSIS — D563 Thalassemia minor: Secondary | ICD-10-CM | POA: Diagnosis not present

## 2023-03-05 DIAGNOSIS — E559 Vitamin D deficiency, unspecified: Secondary | ICD-10-CM | POA: Diagnosis not present

## 2023-03-05 DIAGNOSIS — G43009 Migraine without aura, not intractable, without status migrainosus: Secondary | ICD-10-CM | POA: Diagnosis not present

## 2023-03-05 DIAGNOSIS — F5104 Psychophysiologic insomnia: Secondary | ICD-10-CM | POA: Diagnosis not present

## 2023-03-05 DIAGNOSIS — L732 Hidradenitis suppurativa: Secondary | ICD-10-CM | POA: Diagnosis not present

## 2023-03-05 DIAGNOSIS — D509 Iron deficiency anemia, unspecified: Secondary | ICD-10-CM | POA: Diagnosis not present

## 2023-03-17 DIAGNOSIS — Z3042 Encounter for surveillance of injectable contraceptive: Secondary | ICD-10-CM | POA: Diagnosis not present

## 2023-03-23 DIAGNOSIS — Z111 Encounter for screening for respiratory tuberculosis: Secondary | ICD-10-CM | POA: Diagnosis not present

## 2023-03-26 DIAGNOSIS — D563 Thalassemia minor: Secondary | ICD-10-CM | POA: Diagnosis not present

## 2023-03-26 DIAGNOSIS — D509 Iron deficiency anemia, unspecified: Secondary | ICD-10-CM | POA: Diagnosis not present

## 2023-03-31 DIAGNOSIS — D509 Iron deficiency anemia, unspecified: Secondary | ICD-10-CM | POA: Diagnosis not present

## 2023-04-02 DIAGNOSIS — L732 Hidradenitis suppurativa: Secondary | ICD-10-CM | POA: Diagnosis not present

## 2023-04-02 DIAGNOSIS — L720 Epidermal cyst: Secondary | ICD-10-CM | POA: Diagnosis not present

## 2023-04-02 DIAGNOSIS — L74510 Primary focal hyperhidrosis, axilla: Secondary | ICD-10-CM | POA: Diagnosis not present

## 2023-04-20 DIAGNOSIS — L720 Epidermal cyst: Secondary | ICD-10-CM | POA: Diagnosis not present

## 2023-05-01 DIAGNOSIS — Z79899 Other long term (current) drug therapy: Secondary | ICD-10-CM | POA: Diagnosis not present

## 2023-05-01 DIAGNOSIS — D509 Iron deficiency anemia, unspecified: Secondary | ICD-10-CM | POA: Diagnosis not present

## 2023-05-13 DIAGNOSIS — L732 Hidradenitis suppurativa: Secondary | ICD-10-CM | POA: Diagnosis not present

## 2023-06-18 DIAGNOSIS — D509 Iron deficiency anemia, unspecified: Secondary | ICD-10-CM | POA: Diagnosis not present

## 2023-06-18 DIAGNOSIS — G43009 Migraine without aura, not intractable, without status migrainosus: Secondary | ICD-10-CM | POA: Diagnosis not present

## 2023-06-18 DIAGNOSIS — D563 Thalassemia minor: Secondary | ICD-10-CM | POA: Diagnosis not present

## 2023-06-18 DIAGNOSIS — E559 Vitamin D deficiency, unspecified: Secondary | ICD-10-CM | POA: Diagnosis not present

## 2023-06-18 DIAGNOSIS — L732 Hidradenitis suppurativa: Secondary | ICD-10-CM | POA: Diagnosis not present

## 2023-06-18 DIAGNOSIS — Z0000 Encounter for general adult medical examination without abnormal findings: Secondary | ICD-10-CM | POA: Diagnosis not present

## 2023-06-18 DIAGNOSIS — Z113 Encounter for screening for infections with a predominantly sexual mode of transmission: Secondary | ICD-10-CM | POA: Diagnosis not present

## 2023-06-18 DIAGNOSIS — Z124 Encounter for screening for malignant neoplasm of cervix: Secondary | ICD-10-CM | POA: Diagnosis not present

## 2023-07-10 ENCOUNTER — Other Ambulatory Visit: Payer: Self-pay | Admitting: Surgery

## 2023-07-10 DIAGNOSIS — L732 Hidradenitis suppurativa: Secondary | ICD-10-CM | POA: Diagnosis not present

## 2023-07-25 ENCOUNTER — Emergency Department (HOSPITAL_BASED_OUTPATIENT_CLINIC_OR_DEPARTMENT_OTHER)
Admission: EM | Admit: 2023-07-25 | Discharge: 2023-07-25 | Disposition: A | Discharge status: Home / Self Care | Payer: Medicaid Other | Attending: Emergency Medicine | Admitting: Emergency Medicine

## 2023-07-25 ENCOUNTER — Encounter: Payer: Self-pay | Admitting: Hematology

## 2023-07-25 ENCOUNTER — Encounter (HOSPITAL_BASED_OUTPATIENT_CLINIC_OR_DEPARTMENT_OTHER): Payer: Self-pay | Admitting: Emergency Medicine

## 2023-07-25 DIAGNOSIS — K2900 Acute gastritis without bleeding: Secondary | ICD-10-CM | POA: Insufficient documentation

## 2023-07-25 DIAGNOSIS — R1013 Epigastric pain: Secondary | ICD-10-CM | POA: Diagnosis present

## 2023-07-25 LAB — URINALYSIS, ROUTINE W REFLEX MICROSCOPIC
Bilirubin Urine: NEGATIVE
Glucose, UA: NEGATIVE mg/dL
Ketones, ur: 15 mg/dL — AB
Nitrite: NEGATIVE
Protein, ur: 30 mg/dL — AB
Specific Gravity, Urine: 1.036 — ABNORMAL HIGH (ref 1.005–1.030)
pH: 6.5 (ref 5.0–8.0)

## 2023-07-25 LAB — CBC
HCT: 38.5 % (ref 36.0–46.0)
Hemoglobin: 12.5 g/dL (ref 12.0–15.0)
MCH: 27.8 pg (ref 26.0–34.0)
MCHC: 32.5 g/dL (ref 30.0–36.0)
MCV: 85.7 fL (ref 80.0–100.0)
Platelets: 254 10*3/uL (ref 150–400)
RBC: 4.49 MIL/uL (ref 3.87–5.11)
RDW: 12 % (ref 11.5–15.5)
WBC: 4.1 10*3/uL (ref 4.0–10.5)
nRBC: 0 % (ref 0.0–0.2)

## 2023-07-25 LAB — LIPASE, BLOOD: Lipase: 11 U/L (ref 11–51)

## 2023-07-25 LAB — COMPREHENSIVE METABOLIC PANEL
ALT: 6 U/L (ref 0–44)
AST: 12 U/L — ABNORMAL LOW (ref 15–41)
Albumin: 4.5 g/dL (ref 3.5–5.0)
Alkaline Phosphatase: 61 U/L (ref 38–126)
Anion gap: 6 (ref 5–15)
BUN: 5 mg/dL — ABNORMAL LOW (ref 6–20)
CO2: 29 mmol/L (ref 22–32)
Calcium: 9.3 mg/dL (ref 8.9–10.3)
Chloride: 103 mmol/L (ref 98–111)
Creatinine, Ser: 0.82 mg/dL (ref 0.44–1.00)
GFR, Estimated: >60 mL/min (ref >60)
Glucose, Bld: 86 mg/dL (ref 70–99)
Potassium: 4 mmol/L (ref 3.5–5.1)
Sodium: 138 mmol/L (ref 135–145)
Total Bilirubin: 0.5 mg/dL (ref 0.3–1.2)
Total Protein: 7.4 g/dL (ref 6.5–8.1)

## 2023-07-25 LAB — PREGNANCY, URINE: Preg Test, Ur: NEGATIVE

## 2023-07-25 MED ORDER — ONDANSETRON HCL 4 MG PO TABS: 4.0000 mg | ORAL_TABLET | Freq: Four times a day (QID) | ORAL | 0 refills | Status: DC | PRN

## 2023-07-25 MED ORDER — ONDANSETRON HCL 4 MG/2ML IJ SOLN
4.0000 mg | Freq: Once | INTRAMUSCULAR | Status: AC
  Administered 2023-07-25: 4 mg via INTRAVENOUS
  Filled 2023-07-25: qty 2

## 2023-07-25 MED ORDER — SODIUM CHLORIDE 0.9 % IV BOLUS
1000.0000 mL | Freq: Once | INTRAVENOUS | Status: AC
  Administered 2023-07-25: 1000 mL via INTRAVENOUS

## 2023-07-25 MED ORDER — MORPHINE SULFATE (PF) 4 MG/ML IV SOLN
4.0000 mg | Freq: Once | INTRAVENOUS | Status: AC
  Administered 2023-07-25: 4 mg via INTRAVENOUS
  Filled 2023-07-25: qty 1

## 2023-07-25 MED ORDER — DICYCLOMINE HCL 20 MG PO TABS: 20.0000 mg | ORAL_TABLET | Freq: Two times a day (BID) | ORAL | 0 refills | Status: DC | PRN

## 2023-07-25 MED ORDER — ALUM & MAG HYDROXIDE-SIMETH 200-200-20 MG/5ML PO SUSP
30.0000 mL | Freq: Once | ORAL | Status: AC
  Administered 2023-07-25: 30 mL via ORAL
  Filled 2023-07-25: qty 30

## 2023-07-25 MED ORDER — LIDOCAINE VISCOUS HCL 2 % MT SOLN
15.0000 mL | Freq: Once | OROMUCOSAL | Status: AC
  Administered 2023-07-25: 15 mL via ORAL
  Filled 2023-07-25: qty 15

## 2023-07-25 MED ORDER — DICYCLOMINE HCL 10 MG PO CAPS
10.0000 mg | ORAL_CAPSULE | Freq: Once | ORAL | Status: AC
  Administered 2023-07-25: 10 mg via ORAL
  Filled 2023-07-25: qty 1

## 2023-07-25 NOTE — ED Provider Notes (Signed)
[place] EMERGENCY DEPARTMENT AT [facility] Provider Note   CSN: [id number] Arrival date & time: 07/25/23  1550     History  Chief Complaint  Patient presents with   Abdominal Pain    Judy Rodgers is a 27 y.o. female with PMH significant for migraines, anemia who presents with epigastric abdominal pain for 24 hours associate with nausea and vomiting. Denies diarrhea, constipation, diarrhea, fever, chills.  She denies any dysuria, hematuria, vaginal discharge, she reports that she is currently on her menstrual cycle.  She reports that it is normal for her.   Abdominal Pain      Home Medications Prior to Admission medications   Medication Sig Start Date End Date Taking? Authorizing Provider  dicyclomine (BENTYL) 20 MG tablet Take 1 tablet (20 mg total) by mouth 2 (two) times daily as needed for spasms. 07/25/23  Yes Yanni Ruberg H, PA-C  ondansetron (ZOFRAN) 4 MG tablet Take 1 tablet (4 mg total) by mouth every 6 (six) hours as needed for nausea or vomiting. 07/25/23  Yes Drequan Ironside H, PA-C  acetaminophen (TYLENOL) 500 MG tablet Take 1,000 mg by mouth every 8 (eight) hours as needed for mild pain or headache.    [provider]  cyclobenzaprine (FLEXERIL) 5 MG tablet Take 2 tablets (10 mg total) by mouth 2 (two) times daily as needed for muscle spasms. 09/09/21   Horton, Mayer Masker, MD  ibuprofen (ADVIL,MOTRIN) 800 MG tablet Take 1 tablet (800 mg total) by mouth every 8 (eight) hours as needed. 10/07/18   Rasch, Victorino Dike I, NP  naproxen (NAPROSYN) 500 MG tablet Take 1 tablet (500 mg total) by mouth 2 (two) times daily. 09/09/21   Horton, Mayer Masker, MD  Prenatal Multivit-Min-Fe-FA (PRENATAL VITAMINS) 0.8 MG tablet Take 1 tablet by mouth daily. 12/02/17   Adam Phenix, MD      Allergies    Penicillins, Amoxicillin-pot clavulanate, and Metronidazole    Review of Systems   Review of Systems  Gastrointestinal:  Positive for abdominal pain.  All  other systems reviewed and are negative.   Physical Exam Updated Vital Signs BP 110/70   Pulse 70   Temp 99 F (37.2 C)   Resp 20   LMP 07/18/2023   SpO2 100%  Physical Exam Vitals and nursing note reviewed.  Constitutional:      General: She is not in acute distress.    Appearance: Normal appearance.  HENT:     Head: Normocephalic and atraumatic.  Eyes:     General:        Right eye: No discharge.        Left eye: No discharge.  Cardiovascular:     Rate and Rhythm: Normal rate and regular rhythm.     Heart sounds: No murmur heard.    No friction rub. No gallop.  Pulmonary:     Effort: Pulmonary effort is normal.     Breath sounds: Normal breath sounds.  Abdominal:     General: Bowel sounds are normal.     Palpations: Abdomen is soft.     Comments: Some diffuse tenderness to palpation of the abdomen, most focally around the epigastric region, no significant right upper quadrant tenderness, no rebound, rigidity, guarding, negative Murphy sign.,  Negative McBurney's point tenderness.  Skin:    General: Skin is warm and dry.     Capillary Refill: Capillary refill takes less than 2 seconds.  Neurological:     Mental Status: She is alert and  oriented to person, place, and time.  Psychiatric:        Mood and Affect: Mood normal.        Behavior: Behavior normal.     ED Results / Procedures / Treatments   Labs (all labs ordered are listed, but only abnormal results are displayed) Labs Reviewed  COMPREHENSIVE METABOLIC PANEL - Abnormal; Notable for the following components:      Result Value   BUN 5 (*)    AST 12 (*)    All other components within normal limits  URINALYSIS, ROUTINE W REFLEX MICROSCOPIC - Abnormal; Notable for the following components:   Color, Urine ORANGE (*)    APPearance HAZY (*)    Specific Gravity, Urine 1.036 (*)    Hgb urine dipstick LARGE (*)    Ketones, ur 15 (*)    Protein, ur 30 (*)    Leukocytes,Ua SMALL (*)    Bacteria, UA RARE (*)     All other components within normal limits  LIPASE, BLOOD  CBC  PREGNANCY, URINE    EKG None  Radiology No results found.  Procedures Procedures    Medications Ordered in ED Medications  sodium chloride 0.9 % bolus 1,000 mL (1,000 mLs Intravenous New Bag/Given 07/25/23 1642)  ondansetron (ZOFRAN) injection 4 mg (4 mg Intravenous Given 07/25/23 1642)  dicyclomine (BENTYL) capsule 10 mg (10 mg Oral Given 07/25/23 1714)  morphine (PF) 4 MG/ML injection 4 mg (4 mg Intravenous Given 07/25/23 1643)  alum & mag hydroxide-simeth (MAALOX/MYLANTA) 200-200-20 MG/5ML suspension 30 mL (30 mLs Oral Given 07/25/23 1714)    And  lidocaine (XYLOCAINE) 2 % viscous mouth solution 15 mL (15 mLs Oral Given 07/25/23 1714)    ED Course/ Medical Decision Making/ A&P                                 Medical Decision Making Amount and/or Complexity of Data Reviewed Labs: ordered.  Risk OTC drugs. Prescription drug management.   This patient is a 27 y.o. female  who presents to the ED for concern of epigastric abdominal pain.   Differential diagnoses prior to evaluation: The emergent differential diagnosis includes, but is not limited to,  esophagitis, gastritis, peptic ulcer disease, esophageal rupture, gastric rupture, Boerhaave's, Mallory-Weiss, pancreatitis, cholecystitis, cholangitis, acute mesenteric ischemia, atypical chest pain or ACS, lower lobar pneumonia versus other . This is not an exhaustive differential.   Past Medical History / Co-morbidities / Social History: Anemia, overall otherwise noncontributory  Physical Exam: Physical exam performed. The pertinent findings include: Patient with some focal epigastric tenderness to palpation, no rebound, rigidity, guarding, and no Murphy sign, and no McBurney's point tenderness.  Vital signs stable other than some mildly elevated temperature at 99.  Lab Tests/Imaging studies: I personally interpreted labs/imaging and the pertinent results include:  CBC unremarkable, normal lipase, CMP overall unremarkable, negative pregnancy test.  UA with some hemoglobin although patient currently on.,  Small leukocytes and rare bacteria but with significant squamous contamination, low clinical suspicion for acute UTI.   Medications: I ordered medication including GI cocktail, Bentyl, morphine, and Zofran as well as fluid bolus for epigastric abdominal pain, suspected gastroenteritis, versus gastritis, versus other.  Patient feeling significantly improved after medications, given her otherwise unremarkable lab findings and significant improvement with medications discussed the utility of CT scan or other workup and do not think that it is necessary at this time..  I have reviewed  the patients home medicines and have made adjustments as needed.   Disposition: After consideration of the diagnostic results and the patients response to treatment, I feel that patient symptoms consistent with gastroenteritis versus gastritis, and feeling improved on reevaluation, discharged with Zofran, Bentyl, encouraged bland diet, patient discharged in stable condition at this time, return precautions given..   emergency department workup does not suggest an emergent condition requiring admission or immediate intervention beyond what has been performed at this time. The plan is: as above. The patient is safe for discharge and has been instructed to return immediately for worsening symptoms, change in symptoms or any other concerns.  Final Clinical Impression(s) / ED Diagnoses Final diagnoses:  Acute gastritis without hemorrhage, unspecified gastritis type    Rx / DC Orders ED Discharge Orders          Ordered    ondansetron (ZOFRAN) 4 MG tablet  Every 6 hours PRN        07/25/23 1742    dicyclomine (BENTYL) 20 MG tablet  2 times daily PRN        07/25/23 1742              Jelisha Weed, [place] H, PA-C 07/25/23 1820    Rexford Maus, DO 07/25/23 1920

## 2023-07-25 NOTE — ED Triage Notes (Signed)
Abdo pain started 2 days ago.pain, nausea , vomiting

## 2023-07-25 NOTE — ED Notes (Signed)
Dc instructions reviewed with patient. Patient voiced understanding. Dc with belongings.  °

## 2023-07-27 ENCOUNTER — Encounter (HOSPITAL_BASED_OUTPATIENT_CLINIC_OR_DEPARTMENT_OTHER): Payer: Self-pay | Admitting: Surgery

## 2023-07-27 ENCOUNTER — Other Ambulatory Visit: Payer: Self-pay

## 2023-08-03 MED ORDER — CHLORHEXIDINE GLUCONATE CLOTH 2 % EX PADS: 6.0000 | MEDICATED_PAD | Freq: Once | CUTANEOUS | Status: DC

## 2023-08-03 MED ORDER — ENSURE PRE-SURGERY PO LIQD: 296.0000 mL | Freq: Once | ORAL | Status: DC

## 2023-08-03 NOTE — Progress Notes (Signed)

## 2023-08-03 NOTE — H&P (Signed)
REFERRING PHYSICIAN: Rebecka Apley, NP PROVIDER: Wayne Both, MD MRN: [id number] DOB: June 22, 1996 DATE OF ENCOUNTER: 07/10/2023 Subjective   Chief Complaint: New Consultation (Hidradenitis suppurativa of left axilla)  History of Present Illness: Judy Rodgers is a 27 y.o. female who is seen today as an office consultation for evaluation of New Consultation (Hidradenitis suppurativa of left axilla)  This is a pleasant 27 year old female who is referred here for evaluation of hidradenitis. She has had hidradenitis since she was an early teenager. She reports the areas in her groins have now resolved. She still has continued chronic draining sinus tracts in her axilla with the left being worse than the right. She is intermittently on antibiotics. She has moderate discomfort and having it in both axilla limits her activity with her young children. She has a family history of hidradenitis in her mother  Review of Systems: A complete review of systems was obtained from the patient. I have reviewed this information and discussed as appropriate with the patient. See HPI as well for other ROS.  ROS   Medical History: Past Medical History:  Diagnosis Date  Anemia  Anxiety   There is no problem list on file for this patient.  Past Surgical History:  Procedure Laterality Date  TONSILLECTOMY    Allergies  Allergen Reactions  Penicillins Nausea And Vomiting, Other (See Comments), Hives and Rash  vertigo  Has patient had a PCN reaction causing immediate rash, facial/tongue/throat swelling, SOB or lightheadedness with hypotension: No Has patient had a PCN reaction causing severe rash involving mucus membranes or skin necrosis: No Has patient had a PCN reaction that required hospitalization: No Has patient had a PCN reaction occurring within the last 10 years: No If all of the above answers are "NO", then may proceed with Cephalosporin use.  Has patient had a PCN reaction  causing immediate rash, facial/tongue/throat swelling, SOB or lightheadedness with hypotension: No Has patient had a PCN reaction causing severe rash involving mucus membranes or skin necrosis: No Has patient had a PCN reaction that required hospitalization: No Has patient had a PCN reaction occurring within the last 10 years: No If all of the above answers are "NO", then may proceed with Cephalosporin use.  Metronidazole Nausea And Vomiting and Rash  Nausea - tablets, rash - gel   Current Outpatient Medications on File Prior to Visit  Medication Sig Dispense Refill  COSENTYX PEN 150 mg/mL PnIj Inject subcutaneously  ergocalciferol, vitamin D2, 1,250 mcg (50,000 unit) capsule Take by mouth  ferrous sulfate 325 (65 FE) MG tablet Take 325 mg by mouth once daily   No current facility-administered medications on file prior to visit.   History reviewed. No pertinent family history.   Social History   Tobacco Use  Smoking Status Never  Smokeless Tobacco Never    Social History   Socioeconomic History  Marital status: Single  Tobacco Use  Smoking status: Never  Smokeless tobacco: Never  Substance and Sexual Activity  Alcohol use: Yes  Drug use: Never   Social Determinants of Health   Financial Resource Strain: Low Risk (06/15/2023)  Received from [facility]  Overall Financial Resource Strain (CARDIA)  Difficulty of Paying Living Expenses: Not very hard  Food Insecurity: No Food Insecurity (06/15/2023)  Received from [facility]  Hunger Vital Sign  Worried About Running Out of Food in the Last Year: Never true  Ran Out of Food in the Last Year: Never true  Transportation Needs: No Transportation Needs (06/15/2023)  Received  from [facility] - Transportation  Lack of Transportation (Medical): No  Lack of Transportation (Non-Medical): No  Physical Activity: Unknown (06/15/2023)  Received from [facility]  Exercise Vital Sign  Days of Exercise per Week: 0 days   Stress: No Stress Concern Present (06/15/2023)  Received from [facility] - Occupational Stress Questionnaire  Feeling of Stress : Not at all  Social Connections: Moderately Integrated (06/15/2023)  Received from [facility]  Social Network  How would you rate your social network (family, work, friends)?: Adequate participation with social networks   Objective:   Vitals:  07/10/23 0921  BP: 120/70  Pulse: (!) 113  Temp: 36.4 C (97.6 F)  SpO2: 98%  Weight: 59.4 kg (131 lb)  Height: 157.5 cm (5\' 2" )  PainSc: 0-No pain   Body mass index is 23.96 kg/m.  Physical Exam   She appears well on exam  She has chronic sinus tracts in both her axilla with the left being much worse than the right. On the right side it is mostly on the very most proximal right arm. There are currently no open wounds on either side.  Labs, Imaging and Diagnostic Testing: I reviewed the notes in the electronic medical records  Assessment and Plan:   Diagnoses and all orders for this visit:  Hidradenitis suppurativa   We discussed the diagnosis of hidradenitis in detail which she is well aware of having seen dermatologist and other primary care providers for this. She is definitely interested in wide excision given the extent of her axillary disease. We discussed doing just the left side and allowing this to heal before attempting the right side verses doing both sides at 1 time. As the right side is much smaller she would like to go ahead and have both sides excised with 1 surgery which I feel is reasonable. We will thus proceed with wide excision of hidradenitis in both her axilla bilaterally. I explained the surgical procedure in detail. We discussed the risks which includes but is not limited to bleeding, infection, recurrence of disease as this is not curable, continued drainage, wound breakdown with having chronic open wounds which will require wound care,  cardiopulmonary issues with general anesthesia, postoperative recovery, etc. She understands and wished to proceed with surgery which will be scheduled

## 2023-08-04 ENCOUNTER — Ambulatory Visit (HOSPITAL_BASED_OUTPATIENT_CLINIC_OR_DEPARTMENT_OTHER)
Admission: RE | Admit: 2023-08-04 | Discharge: 2023-08-04 | Disposition: A | Discharge status: Home / Self Care | Payer: Medicaid Other | Attending: Surgery | Admitting: Surgery

## 2023-08-04 ENCOUNTER — Encounter (HOSPITAL_BASED_OUTPATIENT_CLINIC_OR_DEPARTMENT_OTHER)
Admission: RE | Disposition: A | Discharge status: Home / Self Care | Payer: Self-pay | Source: Home / Self Care | Attending: Surgery

## 2023-08-04 ENCOUNTER — Ambulatory Visit (HOSPITAL_BASED_OUTPATIENT_CLINIC_OR_DEPARTMENT_OTHER): Payer: Medicaid Other | Admitting: Anesthesiology

## 2023-08-04 ENCOUNTER — Encounter (HOSPITAL_BASED_OUTPATIENT_CLINIC_OR_DEPARTMENT_OTHER): Payer: Self-pay | Admitting: Surgery

## 2023-08-04 ENCOUNTER — Other Ambulatory Visit: Payer: Self-pay

## 2023-08-04 DIAGNOSIS — L732 Hidradenitis suppurativa: Secondary | ICD-10-CM | POA: Diagnosis not present

## 2023-08-04 DIAGNOSIS — Z01818 Encounter for other preprocedural examination: Secondary | ICD-10-CM

## 2023-08-04 HISTORY — PX: HYDRADENITIS EXCISION: SHX5243

## 2023-08-04 HISTORY — DX: Hidradenitis suppurativa: L73.2

## 2023-08-04 LAB — POCT PREGNANCY, URINE: Preg Test, Ur: NEGATIVE

## 2023-08-04 SURGERY — EXCISION, HIDRADENITIS, AXILLA
Anesthesia: General | Site: Axilla | Laterality: Bilateral

## 2023-08-04 MED ORDER — DEXAMETHASONE SODIUM PHOSPHATE 10 MG/ML IJ SOLN
INTRAMUSCULAR | Status: AC
  Filled 2023-08-04: qty 3

## 2023-08-04 MED ORDER — FENTANYL CITRATE (PF) 100 MCG/2ML IJ SOLN
INTRAMUSCULAR | Status: AC
  Filled 2023-08-04: qty 2

## 2023-08-04 MED ORDER — FENTANYL CITRATE (PF) 100 MCG/2ML IJ SOLN
INTRAMUSCULAR | Status: DC | PRN
  Administered 2023-08-04: 50 ug via INTRAVENOUS
  Administered 2023-08-04: 25 ug via INTRAVENOUS
  Administered 2023-08-04 (×2): 50 ug via INTRAVENOUS
  Administered 2023-08-04: 25 ug via INTRAVENOUS

## 2023-08-04 MED ORDER — EPHEDRINE 5 MG/ML INJ
INTRAVENOUS | Status: AC
  Filled 2023-08-04: qty 10

## 2023-08-04 MED ORDER — ONDANSETRON HCL 4 MG/2ML IJ SOLN
INTRAMUSCULAR | Status: DC | PRN
  Administered 2023-08-04: 4 mg via INTRAVENOUS

## 2023-08-04 MED ORDER — OXYCODONE HCL 5 MG/5ML PO SOLN: 5.0000 mg | Freq: Once | ORAL | Status: DC | PRN

## 2023-08-04 MED ORDER — LIDOCAINE 2% (20 MG/ML) 5 ML SYRINGE
INTRAMUSCULAR | Status: AC
  Filled 2023-08-04: qty 15

## 2023-08-04 MED ORDER — DEXAMETHASONE SODIUM PHOSPHATE 4 MG/ML IJ SOLN
INTRAMUSCULAR | Status: DC | PRN
  Administered 2023-08-04: 8 mg via INTRAVENOUS

## 2023-08-04 MED ORDER — ONDANSETRON HCL 4 MG/2ML IJ SOLN
INTRAMUSCULAR | Status: AC
  Filled 2023-08-04: qty 6

## 2023-08-04 MED ORDER — BUPIVACAINE-EPINEPHRINE 0.5% -1:200000 IJ SOLN
INTRAMUSCULAR | Status: DC | PRN
  Administered 2023-08-04: 20 mL

## 2023-08-04 MED ORDER — DEXMEDETOMIDINE HCL IN NACL 80 MCG/20ML IV SOLN
INTRAVENOUS | Status: AC
  Filled 2023-08-04: qty 20

## 2023-08-04 MED ORDER — PROPOFOL 500 MG/50ML IV EMUL
INTRAVENOUS | Status: AC
  Filled 2023-08-04: qty 100

## 2023-08-04 MED ORDER — EPHEDRINE 5 MG/ML INJ
INTRAVENOUS | Status: AC
  Filled 2023-08-04: qty 5

## 2023-08-04 MED ORDER — FENTANYL CITRATE (PF) 100 MCG/2ML IJ SOLN
25.0000 ug | INTRAMUSCULAR | Status: DC | PRN
  Administered 2023-08-04: 50 ug via INTRAVENOUS

## 2023-08-04 MED ORDER — MIDAZOLAM HCL 2 MG/2ML IJ SOLN
INTRAMUSCULAR | Status: AC
  Filled 2023-08-04: qty 2

## 2023-08-04 MED ORDER — CIPROFLOXACIN IN D5W 400 MG/200ML IV SOLN
400.0000 mg | INTRAVENOUS | Status: AC
  Administered 2023-08-04: 400 mg via INTRAVENOUS

## 2023-08-04 MED ORDER — CELECOXIB 200 MG PO CAPS
200.0000 mg | ORAL_CAPSULE | Freq: Once | ORAL | Status: AC
  Administered 2023-08-04: 200 mg via ORAL

## 2023-08-04 MED ORDER — ACETAMINOPHEN 500 MG PO TABS
1000.0000 mg | ORAL_TABLET | Freq: Once | ORAL | Status: AC
  Administered 2023-08-04: 1000 mg via ORAL

## 2023-08-04 MED ORDER — MIDAZOLAM HCL 2 MG/2ML IJ SOLN
INTRAMUSCULAR | Status: DC | PRN
  Administered 2023-08-04: 2 mg via INTRAVENOUS

## 2023-08-04 MED ORDER — ONDANSETRON HCL 4 MG/2ML IJ SOLN
INTRAMUSCULAR | Status: AC
  Filled 2023-08-04: qty 4

## 2023-08-04 MED ORDER — LIDOCAINE HCL (CARDIAC) PF 100 MG/5ML IV SOSY
PREFILLED_SYRINGE | INTRAVENOUS | Status: DC | PRN
  Administered 2023-08-04: 60 mg via INTRAVENOUS

## 2023-08-04 MED ORDER — ACETAMINOPHEN 500 MG PO TABS: 1000.0000 mg | ORAL_TABLET | ORAL | Status: AC

## 2023-08-04 MED ORDER — LACTATED RINGERS IV SOLN: INTRAVENOUS | Status: DC

## 2023-08-04 MED ORDER — DEXAMETHASONE SODIUM PHOSPHATE 10 MG/ML IJ SOLN
INTRAMUSCULAR | Status: AC
  Filled 2023-08-04: qty 2

## 2023-08-04 MED ORDER — ACETAMINOPHEN 500 MG PO TABS
ORAL_TABLET | ORAL | Status: AC
  Filled 2023-08-04: qty 2

## 2023-08-04 MED ORDER — CIPROFLOXACIN IN D5W 400 MG/200ML IV SOLN
INTRAVENOUS | Status: AC
  Filled 2023-08-04: qty 200

## 2023-08-04 MED ORDER — LIDOCAINE 2% (20 MG/ML) 5 ML SYRINGE
INTRAMUSCULAR | Status: AC
  Filled 2023-08-04: qty 5

## 2023-08-04 MED ORDER — OXYCODONE HCL 5 MG PO TABS: 5.0000 mg | ORAL_TABLET | Freq: Once | ORAL | Status: DC | PRN

## 2023-08-04 MED ORDER — PHENYLEPHRINE 80 MCG/ML (10ML) SYRINGE FOR IV PUSH (FOR BLOOD PRESSURE SUPPORT)
PREFILLED_SYRINGE | INTRAVENOUS | Status: AC
  Filled 2023-08-04: qty 10

## 2023-08-04 MED ORDER — CELECOXIB 200 MG PO CAPS
ORAL_CAPSULE | ORAL | Status: AC
  Filled 2023-08-04: qty 1

## 2023-08-04 MED ORDER — DROPERIDOL 2.5 MG/ML IJ SOLN: 0.6250 mg | Freq: Once | INTRAMUSCULAR | Status: DC | PRN

## 2023-08-04 MED ORDER — PROPOFOL 10 MG/ML IV BOLUS
INTRAVENOUS | Status: DC | PRN
  Administered 2023-08-04 (×2): 50 mg via INTRAVENOUS
  Administered 2023-08-04: 150 mg via INTRAVENOUS

## 2023-08-04 MED ORDER — OXYCODONE HCL 5 MG PO TABS
5.0000 mg | ORAL_TABLET | Freq: Four times a day (QID) | ORAL | 0 refills | Status: DC | PRN
Start: 2023-08-04 — End: 2024-08-28

## 2023-08-04 MED ORDER — DEXMEDETOMIDINE HCL IN NACL 80 MCG/20ML IV SOLN
INTRAVENOUS | Status: DC | PRN
  Administered 2023-08-04 (×2): 4 ug via INTRAVENOUS

## 2023-08-04 SURGICAL SUPPLY — 40 items
ADH SKN CLS APL DERMABOND .7 (GAUZE/BANDAGES/DRESSINGS)
APL PRP STRL LF DISP 70% ISPRP (MISCELLANEOUS) ×1
BLADE CLIPPER SURG (BLADE) IMPLANT
BLADE SURG 15 STRL LF DISP TIS (BLADE) ×1 IMPLANT
BLADE SURG 15 STRL SS (BLADE) ×1
CANISTER SUCT 1200ML W/VALVE (MISCELLANEOUS) ×1 IMPLANT
CHLORAPREP W/TINT 26 (MISCELLANEOUS) ×1 IMPLANT
COVER BACK TABLE 60X90IN (DRAPES) ×1 IMPLANT
COVER MAYO STAND STRL (DRAPES) ×1 IMPLANT
DERMABOND ADVANCED .7 DNX12 (GAUZE/BANDAGES/DRESSINGS) ×1 IMPLANT
DRAPE LAPAROSCOPIC ABDOMINAL (DRAPES) IMPLANT
DRAPE LAPAROTOMY 100X72 PEDS (DRAPES) ×1 IMPLANT
DRAPE UTILITY XL STRL (DRAPES) ×1 IMPLANT
ELECT REM PT RETURN 9FT ADLT (ELECTROSURGICAL) ×1
ELECTRODE REM PT RTRN 9FT ADLT (ELECTROSURGICAL) ×1 IMPLANT
GAUZE PAD ABD 8X10 STRL (GAUZE/BANDAGES/DRESSINGS) IMPLANT
GAUZE SPONGE 4X4 12PLY STRL (GAUZE/BANDAGES/DRESSINGS) IMPLANT
GAUZE XEROFORM 1X8 LF (GAUZE/BANDAGES/DRESSINGS) IMPLANT
GLOVE BIOGEL PI IND STRL 6.5 (GLOVE) IMPLANT
GLOVE SURG SIGNA 7.5 PF LTX (GLOVE) ×1 IMPLANT
GLOVE SURG SS PI 6.5 STRL IVOR (GLOVE) IMPLANT
GOWN STRL REUS W/ TWL LRG LVL3 (GOWN DISPOSABLE) ×1 IMPLANT
GOWN STRL REUS W/ TWL XL LVL3 (GOWN DISPOSABLE) ×1 IMPLANT
GOWN STRL REUS W/TWL LRG LVL3 (GOWN DISPOSABLE) ×1
GOWN STRL REUS W/TWL XL LVL3 (GOWN DISPOSABLE) ×1
NDL HYPO 25X1 1.5 SAFETY (NEEDLE) ×1 IMPLANT
NEEDLE HYPO 25X1 1.5 SAFETY (NEEDLE) ×1
NS IRRIG 1000ML POUR BTL (IV SOLUTION) ×1 IMPLANT
PACK BASIN DAY SURGERY FS (CUSTOM PROCEDURE TRAY) ×1 IMPLANT
PENCIL SMOKE EVACUATOR (MISCELLANEOUS) ×1 IMPLANT
SLEEVE SCD COMPRESS KNEE MED (STOCKING) IMPLANT
SPIKE FLUID TRANSFER (MISCELLANEOUS) IMPLANT
SPONGE T-LAP 4X18 ~~LOC~~+RFID (SPONGE) ×1 IMPLANT
SUT ETHILON 2 0 FS 18 (SUTURE) IMPLANT
SUT ETHILON 3 0 FSL (SUTURE) IMPLANT
SYR BULB EAR ULCER 3OZ GRN STR (SYRINGE) ×1 IMPLANT
SYR CONTROL 10ML LL (SYRINGE) ×1 IMPLANT
TOWEL GREEN STERILE FF (TOWEL DISPOSABLE) ×1 IMPLANT
TUBE CONNECTING 20X1/4 (TUBING) ×1 IMPLANT
YANKAUER SUCT BULB TIP NO VENT (SUCTIONS) ×1 IMPLANT

## 2023-08-04 NOTE — Anesthesia Postprocedure Evaluation (Signed)
Anesthesia Post Note  Patient: Judy Rodgers  Procedure(s) Performed: WIDE EXCISION HIDRADENITIS BILATERAL AXILLA (Bilateral: Axilla)     Patient location during evaluation: PACU Anesthesia Type: General Level of consciousness: awake and alert Pain management: pain level controlled Vital Signs Assessment: post-procedure vital signs reviewed and stable Respiratory status: spontaneous breathing, nonlabored ventilation, respiratory function stable and patient connected to nasal cannula oxygen Cardiovascular status: blood pressure returned to baseline and stable Postop Assessment: no apparent nausea or vomiting Anesthetic complications: no   No notable events documented.  Last Vitals:  Vitals:   08/04/23 1215 08/04/23 1235  BP: (!) 113/59 119/81  Pulse: (!) 56 72  Resp: 16 16  Temp:  (!) 36.1 C  SpO2: 98% 99%    Last Pain:  Vitals:   08/04/23 1235  TempSrc:   PainSc: 1                  [name] P Sherese Heyward

## 2023-08-04 NOTE — Interval H&P Note (Signed)
History and Physical Interval Note: no change in H and P  08/04/2023 9:51 AM  Judy Rodgers  has presented today for surgery, with the diagnosis of hidradenitis.  The various methods of treatment have been discussed with the patient and family. After consideration of risks, benefits and other options for treatment, the patient has consented to  Procedure(s) with comments: WIDE EXCISION HIDRADENITIS BILATERAL AXILLA (Bilateral) - LMA as a surgical intervention.  The patient's history has been reviewed, patient examined, no change in status, stable for surgery.  I have reviewed the patient's chart and labs.  Questions were answered to the patient's satisfaction.     Abigail Miyamoto

## 2023-08-04 NOTE — Transfer of Care (Signed)
Immediate Anesthesia Transfer of Care Note  Patient: Fayrene Helper  Procedure(s) Performed: WIDE EXCISION HIDRADENITIS BILATERAL AXILLA (Bilateral: Axilla)  Patient Location: PACU  Anesthesia Type:General  Level of Consciousness: awake, alert , oriented, and patient cooperative  Airway & Oxygen Therapy: Patient Spontanous Breathing  Post-op Assessment: Report given to RN and Post -op Vital signs reviewed and stable  Post vital signs: Reviewed and stable  Last Vitals:  Vitals Value Taken Time  BP 127/56 08/04/23 1118  Temp    Pulse 85 08/04/23 1119  Resp    SpO2 97 % 08/04/23 1119  Vitals shown include unfiled device data.  Last Pain:  Vitals:   08/04/23 0919  TempSrc: Temporal  PainSc: 0-No pain         Complications: No notable events documented.

## 2023-08-04 NOTE — Anesthesia Postprocedure Evaluation (Signed)
Anesthesia Post Note  Patient: Judy Rodgers  Procedure(s) Performed: WIDE EXCISION HIDRADENITIS BILATERAL AXILLA (Bilateral: Axilla)     Patient location during evaluation: PACU Anesthesia Type: General Level of consciousness: awake and alert Pain management: pain level controlled Vital Signs Assessment: post-procedure vital signs reviewed and stable Respiratory status: spontaneous breathing, nonlabored ventilation, respiratory function stable and patient connected to nasal cannula oxygen Cardiovascular status: blood pressure returned to baseline and stable Postop Assessment: no apparent nausea or vomiting Anesthetic complications: no   No notable events documented.  Last Vitals:  Vitals:   08/04/23 1215 08/04/23 1235  BP: (!) 113/59 119/81  Pulse: (!) 56 72  Resp: 16 16  Temp:  (!) 36.1 C  SpO2: 98% 99%    Last Pain:  Vitals:   08/04/23 1235  TempSrc:   PainSc: 1                  Vyom Brass P Becker Christopher

## 2023-08-04 NOTE — Anesthesia Procedure Notes (Signed)
Procedure Name: LMA Insertion Date/Time: 08/04/2023 10:27 AM  Performed by: Earmon Phoenix, CRNAPre-anesthesia Checklist: Patient identified, Emergency Drugs available, Suction available, Patient being monitored and Timeout performed Patient Re-evaluated:Patient Re-evaluated prior to induction Oxygen Delivery Method: Circle system utilized Preoxygenation: Pre-oxygenation with 100% oxygen Induction Type: IV induction Ventilation: Mask ventilation without difficulty LMA: LMA inserted LMA Size: 4.0 Number of attempts: 1 Placement Confirmation: positive ETCO2 and breath sounds checked- equal and bilateral Tube secured with: Tape Dental Injury: Teeth and Oropharynx as per pre-operative assessment

## 2023-08-04 NOTE — Anesthesia Preprocedure Evaluation (Addendum)
Anesthesia Evaluation  Patient identified by MRN, date of birth, ID band Patient awake    Reviewed: Allergy & Precautions, NPO status , Patient's Chart, lab work & pertinent test results  Airway Mallampati: II  TM Distance: >3 FB Neck ROM: Full    Dental no notable dental hx.    Pulmonary neg pulmonary ROS   Pulmonary exam normal        Cardiovascular negative cardio ROS  Rhythm:Regular Rate:Normal     Neuro/Psych  Headaches  negative psych ROS   GI/Hepatic negative GI ROS, Neg liver ROS,,,  Endo/Other  negative endocrine ROS    Renal/GU negative Renal ROS  negative genitourinary   Musculoskeletal   Abdominal Normal abdominal exam  (+)   Peds  Hematology  (+) Blood dyscrasia, anemia   Anesthesia Other Findings   Reproductive/Obstetrics                             Anesthesia Physical Anesthesia Plan  ASA: 2  Anesthesia Plan: General   Post-op Pain Management:    Induction: Intravenous  PONV Risk Score and Plan: 3 and Ondansetron, Dexamethasone, Midazolam and Treatment may vary due to age or medical condition  Airway Management Planned: Mask and LMA  Additional Equipment: None  Intra-op Plan:   Post-operative Plan: Extubation in OR  Informed Consent: I have reviewed the patients History and Physical, chart, labs and discussed the procedure including the risks, benefits and alternatives for the proposed anesthesia with the patient or authorized representative who has indicated his/her understanding and acceptance.     Dental advisory given  Plan Discussed with: CRNA  Anesthesia Plan Comments:        Anesthesia Quick Evaluation

## 2023-08-04 NOTE — Progress Notes (Signed)
Patient was complaining of numbness in right arm, Dr Magnus Ivan came to evaluate pt at bedside. No new orders.

## 2023-08-04 NOTE — Discharge Instructions (Addendum)
You may shower starting tomorrow  Expect a lot of drainage from the incisions  Cover with dry guaze and change as needed.  No vigorous activity until the sutures are removed  Ice pack, tylenol, and ibuprofen also for pain  No Tylenol or ibuprofen until after 3:20pm today if needed  Post Anesthesia Home Care Instructions  Activity: Get plenty of rest for the remainder of the day. A responsible individual must stay with you for 24 hours following the procedure.  For the next 24 hours, DO NOT: -Drive a car -Advertising copywriter -Drink alcoholic beverages -Take any medication unless instructed by your physician -Make any legal decisions or sign important papers.  Meals: Start with liquid foods such as gelatin or soup. Progress to regular foods as tolerated. Avoid greasy, spicy, heavy foods. If nausea and/or vomiting occur, drink only clear liquids until the nausea and/or vomiting subsides. Call your physician if vomiting continues.  Special Instructions/Symptoms: Your throat may feel dry or sore from the anesthesia or the breathing tube placed in your throat during surgery. If this causes discomfort, gargle with warm salt water. The discomfort should disappear within 24 hours.  If you had a scopolamine patch placed behind your ear for the management of post- operative nausea and/or vomiting:  1. The medication in the patch is effective for 72 hours, after which it should be removed.  Wrap patch in a tissue and discard in the trash. Wash hands thoroughly with soap and water. 2. You may remove the patch earlier than 72 hours if you experience unpleasant side effects which may include dry mouth, dizziness or visual disturbances. 3. Avoid touching the patch. Wash your hands with soap and water after contact with the patch.

## 2023-08-04 NOTE — Op Note (Signed)
WIDE EXCISION HIDRADENITIS BILATERAL AXILLA  Procedure Note  DMIYA ANGLEY 08/04/2023   Pre-op Diagnosis: bilateral axillary hidradenitis     Post-op Diagnosis: same  Procedure(s): WIDE EXCISION HIDRADENITIS BILATERAL AXILLA   Surgeon(s): Abigail Miyamoto, MD  Anesthesia: General  Staff:  Circulator: Albin Felling, RN; Maryan Rued, RN Scrub Person: Rolla Etienne  Estimated Blood Loss: Minimal               Findings:  8 cm  x 3 cm skin and subcutaneous tissue from the left axilla.  5 cm x 2 cm and 4 cm x 1 cm skin and subcutaneous tissue from the right axilla.  Procedure: The patient was brought to the operating identifies correct patient.  She is placed upon the operating table general anesthesia was induced.  Her bilateral axilla were then prepped and draped in usual sterile fashion.  I anesthetized the left axilla with Marcaine and performed a wide elliptical incision around all the areas of hidradenitis with the visible sinus tracts using a #15 blade.  I then dissected down to the subcutaneous tissue with electrocautery.  I excised the ellipse of skin and subcutaneous tissue with electrocautery going down only slightly into the axilla.  No active purulence was identified.  The left axilla excision site measured 8 cm x 3 cm.  Hemostasis was achieved with the cautery.  I then closed the incision with interrupted and horizontal mattress 2-0 nylon sutures. I next anesthetized skin in 2 separate areas in the right axilla.  I then performed a wide elliptical incision at the edge of the axilla toward the arm excising the areas of the chronic hidradenitis in elliptical fashion with scalpel.  I then excised the more medial axilla in elliptical fashion as well with scalpel.  The more lateral area was 5 cm x 2 cm in size in the more medial areas 4 cm x 1 cm in size including the skin and subcutaneous tissue.  I achieved hemostasis with the cautery.  I then closed both incisions with  interrupted 2-0 nylon sutures.  Xeroform gauze and ABDs were applied to both incisions.  The patient tolerated the procedure well.  All the counts were correct at the end of the procedure.  The patient was then extubated in the operating room and taken in stable condition to the recovery room.          Abigail Miyamoto   Date: 08/04/2023  Time: 11:09 AM

## 2023-08-05 ENCOUNTER — Encounter (HOSPITAL_BASED_OUTPATIENT_CLINIC_OR_DEPARTMENT_OTHER): Payer: Self-pay | Admitting: Surgery

## 2023-08-10 ENCOUNTER — Emergency Department (HOSPITAL_COMMUNITY): Payer: Medicaid Other

## 2023-08-10 ENCOUNTER — Encounter (HOSPITAL_COMMUNITY): Payer: Self-pay

## 2023-08-10 ENCOUNTER — Emergency Department (HOSPITAL_COMMUNITY)
Admission: EM | Admit: 2023-08-10 | Discharge: 2023-08-10 | Disposition: A | Discharge status: Home / Self Care | Payer: Medicaid Other | Attending: Emergency Medicine | Admitting: Emergency Medicine

## 2023-08-10 DIAGNOSIS — R1032 Left lower quadrant pain: Secondary | ICD-10-CM | POA: Diagnosis not present

## 2023-08-10 DIAGNOSIS — R102 Pelvic and perineal pain: Secondary | ICD-10-CM | POA: Diagnosis not present

## 2023-08-10 DIAGNOSIS — R109 Unspecified abdominal pain: Secondary | ICD-10-CM

## 2023-08-10 DIAGNOSIS — I959 Hypotension, unspecified: Secondary | ICD-10-CM | POA: Diagnosis not present

## 2023-08-10 DIAGNOSIS — R001 Bradycardia, unspecified: Secondary | ICD-10-CM | POA: Diagnosis not present

## 2023-08-10 DIAGNOSIS — R112 Nausea with vomiting, unspecified: Secondary | ICD-10-CM | POA: Diagnosis not present

## 2023-08-10 DIAGNOSIS — R1084 Generalized abdominal pain: Secondary | ICD-10-CM | POA: Diagnosis not present

## 2023-08-10 LAB — CBC WITH DIFFERENTIAL/PLATELET
Abs Immature Granulocytes: 0.01 10*3/uL (ref 0.00–0.07)
Basophils Absolute: 0 10*3/uL (ref 0.0–0.1)
Basophils Relative: 1 %
Eosinophils Absolute: 0.2 10*3/uL (ref 0.0–0.5)
Eosinophils Relative: 3 %
HCT: 35.1 % — ABNORMAL LOW (ref 36.0–46.0)
Hemoglobin: 11.1 g/dL — ABNORMAL LOW (ref 12.0–15.0)
Immature Granulocytes: 0 %
Lymphocytes Relative: 23 %
Lymphs Abs: 1.3 10*3/uL (ref 0.7–4.0)
MCH: 27.4 pg (ref 26.0–34.0)
MCHC: 31.6 g/dL (ref 30.0–36.0)
MCV: 86.7 fL (ref 80.0–100.0)
Monocytes Absolute: 0.5 10*3/uL (ref 0.1–1.0)
Monocytes Relative: 9 %
Neutro Abs: 3.6 10*3/uL (ref 1.7–7.7)
Neutrophils Relative %: 64 %
Platelets: 207 10*3/uL (ref 150–400)
RBC: 4.05 MIL/uL (ref 3.87–5.11)
RDW: 12 % (ref 11.5–15.5)
WBC: 5.7 10*3/uL (ref 4.0–10.5)
nRBC: 0 % (ref 0.0–0.2)

## 2023-08-10 LAB — URINALYSIS, ROUTINE W REFLEX MICROSCOPIC
Bilirubin Urine: NEGATIVE
Glucose, UA: NEGATIVE mg/dL
Hgb urine dipstick: NEGATIVE
Ketones, ur: NEGATIVE mg/dL
Leukocytes,Ua: NEGATIVE
Nitrite: NEGATIVE
Protein, ur: NEGATIVE mg/dL
Specific Gravity, Urine: 1.001 — ABNORMAL LOW (ref 1.005–1.030)
pH: 7 (ref 5.0–8.0)

## 2023-08-10 LAB — COMPREHENSIVE METABOLIC PANEL
ALT: 13 U/L (ref 0–44)
AST: 14 U/L — ABNORMAL LOW (ref 15–41)
Albumin: 3.7 g/dL (ref 3.5–5.0)
Alkaline Phosphatase: 47 U/L (ref 38–126)
Anion gap: 8 (ref 5–15)
BUN: 7 mg/dL (ref 6–20)
CO2: 23 mmol/L (ref 22–32)
Calcium: 8.9 mg/dL (ref 8.9–10.3)
Chloride: 104 mmol/L (ref 98–111)
Creatinine, Ser: 0.73 mg/dL (ref 0.44–1.00)
GFR, Estimated: >60 mL/min (ref >60)
Glucose, Bld: 93 mg/dL (ref 70–99)
Potassium: 3.9 mmol/L (ref 3.5–5.1)
Sodium: 135 mmol/L (ref 135–145)
Total Bilirubin: 0.4 mg/dL (ref 0.3–1.2)
Total Protein: 6.7 g/dL (ref 6.5–8.1)

## 2023-08-10 LAB — HCG, SERUM, QUALITATIVE: Preg, Serum: NEGATIVE

## 2023-08-10 MED ORDER — METHOCARBAMOL 500 MG PO TABS: 500.0000 mg | ORAL_TABLET | Freq: Three times a day (TID) | ORAL | 0 refills | Status: DC | PRN

## 2023-08-10 MED ORDER — LIDOCAINE 5 % EX PTCH
1.0000 | MEDICATED_PATCH | CUTANEOUS | Status: DC
  Administered 2023-08-10: 1 via TRANSDERMAL
  Filled 2023-08-10: qty 1

## 2023-08-10 MED ORDER — IOHEXOL 350 MG/ML SOLN
75.0000 mL | Freq: Once | INTRAVENOUS | Status: AC | PRN
  Administered 2023-08-10: 75 mL via INTRAVENOUS

## 2023-08-10 MED ORDER — FENTANYL CITRATE PF 50 MCG/ML IJ SOSY
50.0000 ug | PREFILLED_SYRINGE | Freq: Once | INTRAMUSCULAR | Status: AC
  Administered 2023-08-10: 50 ug via INTRAVENOUS
  Filled 2023-08-10: qty 1

## 2023-08-10 MED ORDER — ONDANSETRON HCL 4 MG/2ML IJ SOLN
4.0000 mg | Freq: Once | INTRAMUSCULAR | Status: DC
  Filled 2023-08-10: qty 2

## 2023-08-10 MED ORDER — KETOROLAC TROMETHAMINE 15 MG/ML IJ SOLN
15.0000 mg | Freq: Once | INTRAMUSCULAR | Status: AC
  Administered 2023-08-10: 15 mg via INTRAVENOUS
  Filled 2023-08-10: qty 1

## 2023-08-10 NOTE — ED Provider Notes (Cosign Needed Addendum)
[facility] AT [facility] Provider Note   CSN: [id number] Arrival date & time: 08/10/23  0222     History  Chief Complaint  Patient presents with   Abdominal Pain    Judy Rodgers is a 27 y.o. female who presents with concern for sudden onset left sided pelvic pain around 1:00 this morning during intercourse.  Patient states that she had had some mild lower abdominal cramping pain and nausea all day but during intercourse felt a "pop" and sudden onset severe pain resulted in vomiting secondary to severity.  NBNB emesis.  No vaginal bleeding or discharge.  LMP 08/01/2023.  Also endorses surgery for hidradenitis suppurativa to the left armpit On 9/17.  I reviewed her medical records.  History of beta thalassemia.  States she took an oxycodone that had been prescribed to her postoperatively this evening without improvement in her pain prompting ED visit.  Patient initially seen in the ED triage and charge RN informed that patient would require reading due to clinical concern for ovarian torsion  HPI     Home Medications Prior to Admission medications   Medication Sig Start Date End Date Taking? Authorizing Provider  acetaminophen (TYLENOL) 500 MG tablet Take 1,000 mg by mouth every 8 (eight) hours as needed for mild pain or headache.    [provider]  dicyclomine (BENTYL) 20 MG tablet Take 1 tablet (20 mg total) by mouth 2 (two) times daily as needed for spasms. 07/25/23   Prosperi, [name] H, PA-C  ferrous sulfate 324 MG TBEC Take 324 mg by mouth.    [provider]  ondansetron (ZOFRAN) 4 MG tablet Take 1 tablet (4 mg total) by mouth every 6 (six) hours as needed for nausea or vomiting. 07/25/23   Prosperi, [name] H, PA-C  oxyCODONE (OXY IR/ROXICODONE) 5 MG immediate release tablet Take 1 tablet (5 mg total) by mouth every 6 (six) hours as needed for moderate pain, severe pain or breakthrough pain. 08/04/23   Abigail Miyamoto, MD   Secukinumab (COSENTYX) 150 MG/ML SOSY Inject into the skin.    [provider]  Vitamin D, Ergocalciferol, (DRISDOL) 1.25 MG (50000 UNIT) CAPS capsule Take 50,000 Units by mouth every 7 (seven) days.    [provider]      Allergies    Penicillins, Amoxicillin-pot clavulanate, and Metronidazole    Review of Systems   Review of Systems  Constitutional: Negative.   HENT: Negative.    Respiratory: Negative.    Cardiovascular: Negative.   Gastrointestinal:  Positive for abdominal pain, nausea and vomiting.  Genitourinary:  Negative for decreased urine volume, pelvic pain, urgency, vaginal bleeding, vaginal discharge and vaginal pain.    Physical Exam Updated Vital Signs BP (!) 106/56   Pulse 62   Temp 98.3 F (36.8 C) (Oral)   Resp 16   LMP 07/18/2023 (Exact Date) Comment: BTL  SpO2 100%  Physical Exam Vitals and nursing note reviewed.  Constitutional:      Appearance: She is not ill-appearing or toxic-appearing.  HENT:     Head: Normocephalic and atraumatic.     Mouth/Throat:     Mouth: Mucous membranes are moist.     Pharynx: No oropharyngeal exudate or posterior oropharyngeal erythema.  Eyes:     General:        Right eye: No discharge.        Left eye: No discharge.     Conjunctiva/sclera: Conjunctivae normal.  Cardiovascular:     Rate  and Rhythm: Normal rate and regular rhythm.     Pulses: Normal pulses.  Pulmonary:     Effort: Pulmonary effort is normal. No respiratory distress.     Breath sounds: Normal breath sounds. No wheezing or rales.  Abdominal:     General: Bowel sounds are normal. There is no distension.     Palpations: Abdomen is soft.     Tenderness: There is abdominal tenderness in the left lower quadrant. There is guarding. There is no right CVA tenderness, left CVA tenderness or rebound.  Musculoskeletal:        General: No deformity.     Cervical back: Neck supple.  Skin:    General: Skin is warm and dry.     Capillary  Refill: Capillary refill takes less than 2 seconds.  Neurological:     Mental Status: She is alert. Mental status is at baseline.  Psychiatric:        Mood and Affect: Mood normal.     ED Results / Procedures / Treatments   Labs (all labs ordered are listed, but only abnormal results are displayed) Labs Reviewed  CBC WITH DIFFERENTIAL/PLATELET - Abnormal; Notable for the following components:      Result Value   Hemoglobin 11.1 (*)    HCT 35.1 (*)    All other components within normal limits  COMPREHENSIVE METABOLIC PANEL - Abnormal; Notable for the following components:   AST 14 (*)    All other components within normal limits  URINALYSIS, ROUTINE W REFLEX MICROSCOPIC - Abnormal; Notable for the following components:   Color, Urine STRAW (*)    APPearance HAZY (*)    Specific Gravity, Urine 1.001 (*)    All other components within normal limits  HCG, SERUM, QUALITATIVE    EKG None  Radiology CT ABDOMEN PELVIS W CONTRAST  Result Date: 08/10/2023 CLINICAL DATA:  Left lower quadrant abdominal pain EXAM: CT ABDOMEN AND PELVIS WITH CONTRAST TECHNIQUE: Multidetector CT imaging of the abdomen and pelvis was performed using the standard protocol following bolus administration of intravenous contrast. RADIATION DOSE REDUCTION: This exam was performed according to the departmental dose-optimization program which includes automated exposure control, adjustment of the mA and/or kV according to patient size and/or use of iterative reconstruction technique. CONTRAST:  75mL OMNIPAQUE IOHEXOL 350 MG/ML SOLN COMPARISON:  Pelvic ultrasound from earlier today. Abdominal CT 10/18/2017 FINDINGS: Motion artifact causes image blurring through the mid scan. Lower chest:  No contributory findings. Hepatobiliary: No focal liver abnormality.No evidence of biliary obstruction or stone. Pancreas: Unremarkable. Spleen: Unremarkable. Adrenals/Urinary Tract: Negative adrenals. No hydronephrosis or stone.  Unremarkable bladder. Stomach/Bowel:  No obstruction. No appendicitis. Vascular/Lymphatic: No acute vascular abnormality. No mass or adenopathy. Reproductive:Bicornuate appearance. Corpus luteum appearance again suggested in the right ovary with crenulated peripherally enhancing structure measuring 15 mm on coronal reformats. Other: No ascites or pneumoperitoneum. Musculoskeletal: No acute abnormalities. IMPRESSION: No acute finding or explanation for symptoms. Electronically Signed   By: Tiburcio Pea M.D.   On: 08/10/2023 06:26   US PELVIC COMPLETE W TRANSVAGINAL AND TORSION R/O  Result Date: 08/10/2023 CLINICAL DATA:  Pelvic pain.  Acute left lower quadrant pain EXAM: TRANSABDOMINAL AND TRANSVAGINAL ULTRASOUND OF PELVIS DOPPLER ULTRASOUND OF OVARIES TECHNIQUE: Both transabdominal and transvaginal ultrasound examinations of the pelvis were performed. Transabdominal technique was performed for global imaging of the pelvis including uterus, ovaries, adnexal regions, and pelvic cul-de-sac. It was necessary to proceed with endovaginal exam following the transabdominal exam to visualize the ovaries. Color and  duplex Doppler ultrasound was utilized to evaluate blood flow to the ovaries. COMPARISON:  10/18/2017 FINDINGS: Uterus Measurements: 10 x 5 x 6 cm = volume: 143 mL. Mullerian anomaly with bicornuate appearance. There may be a Cesarian section scar. Endometrium Thickness: 9-10 mm, measured on both sides. No focal abnormality visualized. Right ovary Measurements: 53 x 22 x 23 mm = volume: 14 mL. Asymmetric enlargement is due to a crenulated corpus luteal appearance measuring 2.8 cm, asymmetric to the symptomatic side. Left ovary Measurements: 42 x 19 x 21 mm = volume: 9 mL. Normal appearance/no adnexal mass. Pulsed Doppler evaluation of both ovaries demonstrates normal low-resistance arterial and venous waveforms. Other findings Small volume pelvic fluid which could be physiologic. IMPRESSION: 1. No acute  finding.  Normal ovarian blood flow. 2. Corpus luteum appearance on the right. Electronically Signed   By: Tiburcio Pea M.D.   On: 08/10/2023 04:26    Procedures Procedures    Medications Ordered in ED Medications  ondansetron (ZOFRAN) injection 4 mg (4 mg Intravenous Not Given 08/10/23 0304)  fentaNYL (SUBLIMAZE) injection 50 mcg (50 mcg Intravenous Given 08/10/23 0304)  iohexol (OMNIPAQUE) 350 MG/ML injection 75 mL (75 mLs Intravenous Contrast Given 08/10/23 4098)    ED Course/ Medical Decision Making/ A&P                                 Medical Decision Making 27 year old female presents with symptoms of left-sided pelvic pain during intercourse at 1 AM.  Associated nausea vomiting secondary to pain.   Soft blood pressures systolic 100 on intake, vital signs otherwise normal.  Cardiopulmonary is unremarkable, abdominal Zavitz above with guarding and exquisite tenderness palpation of the left lower quadrant.  DDx includes but is not limited to ovarian torsion, ovarian cyst, TOA, PID, diverticulitis, appendicitis, enteric ischemia, psoas abscess, muscular injury  Amount and/or Complexity of Data Reviewed Labs: ordered.    Details: CBC without leuks ptosis, mildly manage level of 11.  CMP unremarkable, pregnancy test is negative, UA without evidence of infection.  Radiology: ordered.    Details:  Pelvic ultrasound to rule out torsion was negative.  Normal ovarian blood flow bilaterally.  Corpus luteum on the right clinical concern initially was for ovarian torsion given sudden onset of pain during intercourse, however this has been ruled out with ultrasound.   Intra-abdominalCT abdomen pelvis negative for acute abnormality.   Risk Prescription drug management.   Given patient's persistent lower abdominal pain and concerning abdominal exam CT has been ordered.  After further discussion with the patient it appears that she felt some strain and achiness in the same area of her  abdomen after intercourse 1 week ago.  States that it was simply acutely exacerbated today.  Clinical picture most consistent at this time with muscular strain, however patient may follow-up in the outpatient setting with her PCP as well for further evaluation.  Will administer Lidoderm patch in the ED and discharged with prescription for Robaxin after shared decision-making with the patient.  Clinical concern for emergent underlying etiology that would warrant further ED workup or inpatient management is exceedingly low.  Judy Rodgers voiced understanding of her medical evaluation and treatment plan. Each of their questions answered to their expressed satisfaction.  Return precautions were given.  Patient is well-appearing, stable, and was discharged in good condition.  This chart was dictated using voice recognition software, Dragon. Despite the best efforts of this provider to  proofread and correct errors, errors may still occur which can change documentation meaning.   This chart was dictated using voice recognition software, Dragon. Despite the best efforts of this provider to proofread and correct errors, errors may still occur which can change documentation meaning.    Final Clinical Impression(s) / ED Diagnoses Final diagnoses:  None    Rx / DC Orders ED Discharge Orders     None         Paris Lore, PA-C 08/10/23 2725    Yitzhak Awan, Eugene Gavia, PA-C 08/10/23 3664    Ernie Avena, MD 08/11/23 0020

## 2023-08-10 NOTE — ED Triage Notes (Addendum)
Pt is coming in for left lower abd pain that started during intercourse, she states it was an immediate pain that was accompanied with a pop. She also vomited with the pain as well. No reported bleeding at this time. Pt is otherwise stable at this time.   Medic vitals   62hr  100%ra 174bgl 102/70 18rr  18g left ac  4mg  zofran given

## 2023-08-10 NOTE — Discharge Instructions (Addendum)
You were seen in the emergency department today for your lower abdominal pain.  Your physical exam and vital signs are very reassuring.  Your workup was very reassuring.  You likely have a muscle strain in your lower abdomen this can be quite painful.  To help with your pain you may take Tylenol and / or NSAID medication (such as ibuprofen or naproxen) to help with your pain.  Additionally, you have been prescribed a muscle relaxer called Robaxin to help relieve some of the muscle spasm.  Please be advised that this medication may make you very sleepy, so you should not drive or operate heavy machinery while you are taking it.  You may also utilize topical pain relief such as Biofreeze, IcyHot, or topical lidocaine patches.  I also recommend that you apply heat to the area, such as a hot shower or heating pad, and follow heat application with massage of the muscles that are most tight.  Please return to the emergency department if you develop any numbness/tingling/weakness in your arms or legs, any difficulty urinating, or urinary incontinence chest pain, shortness of breath, abdominal pain, nausea or vomiting that does not stop, or any other new severe symptoms.

## 2023-08-10 NOTE — ED Notes (Signed)
Patient transported to Ultrasound 

## 2023-08-13 DIAGNOSIS — R102 Pelvic and perineal pain: Secondary | ICD-10-CM | POA: Diagnosis not present

## 2023-08-13 DIAGNOSIS — N730 Acute parametritis and pelvic cellulitis: Secondary | ICD-10-CM | POA: Diagnosis not present

## 2023-08-13 DIAGNOSIS — R112 Nausea with vomiting, unspecified: Secondary | ICD-10-CM | POA: Diagnosis not present

## 2023-09-15 DIAGNOSIS — L732 Hidradenitis suppurativa: Secondary | ICD-10-CM | POA: Diagnosis not present

## 2023-12-24 DIAGNOSIS — L720 Epidermal cyst: Secondary | ICD-10-CM | POA: Diagnosis not present

## 2023-12-24 DIAGNOSIS — L732 Hidradenitis suppurativa: Secondary | ICD-10-CM | POA: Diagnosis not present

## 2024-01-04 DIAGNOSIS — Z20828 Contact with and (suspected) exposure to other viral communicable diseases: Secondary | ICD-10-CM | POA: Diagnosis not present

## 2024-01-04 DIAGNOSIS — R051 Acute cough: Secondary | ICD-10-CM | POA: Diagnosis not present

## 2024-05-13 DIAGNOSIS — R61 Generalized hyperhidrosis: Secondary | ICD-10-CM | POA: Diagnosis not present

## 2024-05-13 DIAGNOSIS — L732 Hidradenitis suppurativa: Secondary | ICD-10-CM | POA: Diagnosis not present

## 2024-05-13 DIAGNOSIS — L709 Acne, unspecified: Secondary | ICD-10-CM | POA: Diagnosis not present

## 2024-05-13 DIAGNOSIS — G43009 Migraine without aura, not intractable, without status migrainosus: Secondary | ICD-10-CM | POA: Diagnosis not present

## 2024-05-13 DIAGNOSIS — D563 Thalassemia minor: Secondary | ICD-10-CM | POA: Diagnosis not present

## 2024-05-13 DIAGNOSIS — Z9851 Tubal ligation status: Secondary | ICD-10-CM | POA: Diagnosis not present

## 2024-05-13 DIAGNOSIS — E559 Vitamin D deficiency, unspecified: Secondary | ICD-10-CM | POA: Diagnosis not present

## 2024-05-13 DIAGNOSIS — D509 Iron deficiency anemia, unspecified: Secondary | ICD-10-CM | POA: Diagnosis not present

## 2024-05-13 DIAGNOSIS — Z113 Encounter for screening for infections with a predominantly sexual mode of transmission: Secondary | ICD-10-CM | POA: Diagnosis not present

## 2024-05-13 DIAGNOSIS — Z0000 Encounter for general adult medical examination without abnormal findings: Secondary | ICD-10-CM | POA: Diagnosis not present

## 2024-05-26 DIAGNOSIS — L732 Hidradenitis suppurativa: Secondary | ICD-10-CM | POA: Diagnosis not present

## 2024-05-26 DIAGNOSIS — E0590 Thyrotoxicosis, unspecified without thyrotoxic crisis or storm: Secondary | ICD-10-CM | POA: Diagnosis not present

## 2024-05-26 DIAGNOSIS — D563 Thalassemia minor: Secondary | ICD-10-CM | POA: Diagnosis not present

## 2024-08-12 DIAGNOSIS — Z800 Family history of malignant neoplasm of digestive organs: Secondary | ICD-10-CM | POA: Diagnosis not present

## 2024-08-12 DIAGNOSIS — E559 Vitamin D deficiency, unspecified: Secondary | ICD-10-CM | POA: Diagnosis not present

## 2024-08-12 DIAGNOSIS — K5904 Chronic idiopathic constipation: Secondary | ICD-10-CM | POA: Diagnosis not present

## 2024-08-12 DIAGNOSIS — N898 Other specified noninflammatory disorders of vagina: Secondary | ICD-10-CM | POA: Diagnosis not present

## 2024-08-12 DIAGNOSIS — D563 Thalassemia minor: Secondary | ICD-10-CM | POA: Diagnosis not present

## 2024-08-12 DIAGNOSIS — R7989 Other specified abnormal findings of blood chemistry: Secondary | ICD-10-CM | POA: Diagnosis not present

## 2024-08-12 DIAGNOSIS — R6889 Other general symptoms and signs: Secondary | ICD-10-CM | POA: Diagnosis not present

## 2024-08-12 DIAGNOSIS — D509 Iron deficiency anemia, unspecified: Secondary | ICD-10-CM | POA: Diagnosis not present

## 2024-08-12 DIAGNOSIS — G479 Sleep disorder, unspecified: Secondary | ICD-10-CM | POA: Diagnosis not present

## 2024-08-12 DIAGNOSIS — R61 Generalized hyperhidrosis: Secondary | ICD-10-CM | POA: Diagnosis not present

## 2024-08-12 DIAGNOSIS — R5383 Other fatigue: Secondary | ICD-10-CM | POA: Diagnosis not present

## 2024-08-28 ENCOUNTER — Other Ambulatory Visit: Payer: Self-pay

## 2024-08-28 ENCOUNTER — Encounter (HOSPITAL_BASED_OUTPATIENT_CLINIC_OR_DEPARTMENT_OTHER): Payer: Self-pay

## 2024-08-28 ENCOUNTER — Emergency Department (HOSPITAL_BASED_OUTPATIENT_CLINIC_OR_DEPARTMENT_OTHER)
Admission: EM | Admit: 2024-08-28 | Discharge: 2024-08-28 | Disposition: A | Discharge status: Home / Self Care | Attending: Emergency Medicine | Admitting: Emergency Medicine

## 2024-08-28 DIAGNOSIS — J029 Acute pharyngitis, unspecified: Secondary | ICD-10-CM | POA: Insufficient documentation

## 2024-08-28 DIAGNOSIS — R059 Cough, unspecified: Secondary | ICD-10-CM | POA: Diagnosis present

## 2024-08-28 LAB — RESP PANEL BY RT-PCR (RSV, FLU A&B, COVID)  RVPGX2
Influenza A by PCR: NEGATIVE
Influenza B by PCR: NEGATIVE
Resp Syncytial Virus by PCR: NEGATIVE
SARS Coronavirus 2 by RT PCR: NEGATIVE

## 2024-08-28 LAB — GROUP A STREP BY PCR: Group A Strep by PCR: NOT DETECTED

## 2024-08-28 MED ORDER — DEXAMETHASONE 4 MG PO TABS
8.0000 mg | ORAL_TABLET | Freq: Once | ORAL | Status: AC
  Administered 2024-08-28: 8 mg via ORAL
  Filled 2024-08-28: qty 2

## 2024-08-28 NOTE — ED Provider Notes (Signed)
 [place] [facility]MENT AT [facility] Provider Note   CSN: [id number] Arrival date & time: 08/28/24  9671     Patient presents with: Sore Throat   Judy Rodgers is a 28 y.o. female.   The history is provided by the patient.  Sore Throat This is a new problem. The current episode started yesterday. The problem has been rapidly worsening. The symptoms are aggravated by swallowing. Nothing relieves the symptoms.  Patient reports onset of sore throat yesterday.  She reports it appeared to gradually worsen throughout the day.  Tonight she woke up around 3 AM and appeared to be much worse.  She is able to drink fluids but it hurts.  No drooling.  No neck stiffness.  No fevers or vomiting.  She has had cough and congestion.  She has had a previous tonsillectomy and adenoidectomy     Prior to Admission medications   Medication Sig Start Date End Date Taking? Authorizing Provider  acetaminophen  (TYLENOL ) 500 MG tablet Take 1,000 mg by mouth every 8 (eight) hours as needed for mild pain or headache.    [provider]  ferrous sulfate  324 MG TBEC Take 324 mg by mouth.    [provider]  Secukinumab (COSENTYX) 150 MG/ML SOSY Inject into the skin.    [provider]  Vitamin D, Ergocalciferol, (DRISDOL) 1.25 MG (50000 UNIT) CAPS capsule Take 50,000 Units by mouth every 7 (seven) days.    [provider]    Allergies: Penicillins, Amoxicillin-pot clavulanate, and Metronidazole     Review of Systems  Constitutional:  Negative for fever.  HENT:  Positive for congestion and sore throat. Negative for dental problem and drooling.   Respiratory:  Positive for cough.   Gastrointestinal:  Negative for vomiting.    Updated Vital Signs BP 115/67   Pulse 66   Temp 99.1 F (37.3 C) (Oral)   Resp 16   LMP 08/04/2024 (Exact Date)   SpO2 100%   Physical Exam CONSTITUTIONAL: Well developed/well nourished, no distress HEAD:  Normocephalic/atraumatic EYES: EOMI/PERRL ENMT: Mucous membranes moist Uvula midline with overlying erythema but no exudates.  No edema. No stridor or drooling.  No trismus  no dysphonia NECK: supple no meningeal signs CV: S1/S2 noted, no murmurs/rubs/gallops noted LUNGS: Lungs are clear to auscultation bilaterally, no apparent distress NEURO: Pt is awake/alert/appropriate, moves all extremitiesx4.  No facial droop.    (all labs ordered are listed, but only abnormal results are displayed) Labs Reviewed  GROUP A STREP BY PCR  RESP PANEL BY RT-PCR (RSV, FLU A&B, COVID)  [id number]    EKG: None  Radiology: No results found.   Procedures   Medications Ordered in the ED  dexamethasone  (DECADRON ) tablet 8 mg (8 mg Oral Given 08/28/24 0433)                                    Medical Decision Making Risk Prescription drug management.   This patient presents to the ED for concern of sore throat, this involves an extensive number of treatment options, and is a complaint that carries with it a high risk of complications and morbidity.  The differential diagnosis includes but is not limited to viral pharyngitis, strep pharyngitis, mono, retropharyngeal abscess, peritonsillar abscess  Comorbidities that complicate the patient evaluation: Patient's presentation is complicated by their history of hidradenitis suppurativa  Lab Tests: I Ordered, and personally interpreted labs.  The  pertinent results include: Strep test and COVID/flu panel negative   Medicines ordered and prescription drug management: I ordered medication including dexamethasone  for pharyngitis  Test Considered: Patient is overall well-appearing, airway is clear, healing her secretions, will defer CT neck at this time   Complexity of problems addressed: Patient's presentation is most consistent with  acute complicated illness/injury requiring diagnostic workup  Disposition: After consideration of the diagnostic  results and the patient's response to treatment,  I feel that the patent would benefit from discharge  .    Patient well-appearing, able to drink fluids, no acute distress.  Low suspicion for deep space infection in the neck.  No indication for antibiotics at this time.  She is overall well-appearing and appropriate for outpatient management.  We discussed strict ER return precautions    Final diagnoses:  Pharyngitis, unspecified etiology    ED Discharge Orders     None          Midge Golas, MD 08/28/24 [phone]

## 2024-08-28 NOTE — ED Triage Notes (Signed)
 Pt states that she had sudden onset of sore throat yesterday but woke up 30 mins ago and pain was severe making it difficult for her to swallow.

## 2024-09-12 DIAGNOSIS — D509 Iron deficiency anemia, unspecified: Secondary | ICD-10-CM | POA: Diagnosis not present

## 2024-09-12 DIAGNOSIS — K5900 Constipation, unspecified: Secondary | ICD-10-CM | POA: Diagnosis not present

## 2024-09-12 DIAGNOSIS — D563 Thalassemia minor: Secondary | ICD-10-CM | POA: Diagnosis not present

## 2024-09-12 DIAGNOSIS — D508 Other iron deficiency anemias: Secondary | ICD-10-CM | POA: Diagnosis not present

## 2024-09-12 DIAGNOSIS — R7989 Other specified abnormal findings of blood chemistry: Secondary | ICD-10-CM | POA: Diagnosis not present

## 2024-09-12 DIAGNOSIS — N924 Excessive bleeding in the premenopausal period: Secondary | ICD-10-CM | POA: Diagnosis not present

## 2024-09-19 DIAGNOSIS — D509 Iron deficiency anemia, unspecified: Secondary | ICD-10-CM | POA: Diagnosis not present

## 2024-09-26 DIAGNOSIS — D509 Iron deficiency anemia, unspecified: Secondary | ICD-10-CM | POA: Diagnosis not present

## 2024-10-07 DIAGNOSIS — E559 Vitamin D deficiency, unspecified: Secondary | ICD-10-CM | POA: Diagnosis not present

## 2024-10-07 DIAGNOSIS — D563 Thalassemia minor: Secondary | ICD-10-CM | POA: Diagnosis not present

## 2024-10-07 DIAGNOSIS — F411 Generalized anxiety disorder: Secondary | ICD-10-CM | POA: Diagnosis not present

## 2024-10-07 DIAGNOSIS — L709 Acne, unspecified: Secondary | ICD-10-CM | POA: Diagnosis not present

## 2024-10-07 DIAGNOSIS — G43009 Migraine without aura, not intractable, without status migrainosus: Secondary | ICD-10-CM | POA: Diagnosis not present

## 2024-10-07 DIAGNOSIS — L732 Hidradenitis suppurativa: Secondary | ICD-10-CM | POA: Diagnosis not present

## 2024-10-07 DIAGNOSIS — D509 Iron deficiency anemia, unspecified: Secondary | ICD-10-CM | POA: Diagnosis not present

## 2024-10-10 ENCOUNTER — Ambulatory Visit: Admitting: Primary Care

## 2024-11-28 ENCOUNTER — Encounter: Payer: Self-pay | Admitting: Primary Care

## 2024-11-28 ENCOUNTER — Ambulatory Visit (INDEPENDENT_AMBULATORY_CARE_PROVIDER_SITE_OTHER): Admitting: Primary Care

## 2024-11-28 VITALS — BP 106/66 | HR 99 | Ht 62.0 in | Wt 143.4 lb

## 2024-11-28 DIAGNOSIS — R0683 Snoring: Secondary | ICD-10-CM | POA: Diagnosis not present

## 2024-11-28 NOTE — Patient Instructions (Signed)
 " VISIT SUMMARY: You came in today for a sleep consultation due to experiencing night sweats, tossing and turning, and trouble staying asleep for a couple of years. These issues have significantly affected your daily life, especially as your children are getting older. You have not undergone a sleep study previously due to fear of the procedure.  YOUR PLAN: -EVALUATION FOR SLEEP-DISORDERED BREATHING: You have been experiencing chronic sleep disturbances, including night sweats, tossing and turning, and difficulty maintaining sleep. We have ordered a home sleep study through [facility] to evaluate for sleep apnea. If sleep apnea is diagnosed, we will discuss treatment options including positional therapy, weight loss, oral appliance, CPAP, and potential ENT referral for surgical options. If sleep apnea is not diagnosed, we will consider checking your hormone levels due to night sweats. We also discussed the potential short-term use of sleep aids if needed and emphasized the importance of sleep hygiene.  INSTRUCTIONS: Please complete the home sleep study as ordered. Follow up with us  to discuss the results and next steps based on the findings.  Orders: Home sleep study  Follow-up Please send my a mychart message 2 weeks after turning in sleep study for results and next steps      Sleep Apnea  Sleep apnea is a condition that affects your breathing while you are sleeping. Your tongue or soft tissue in your throat may block the flow of air while you sleep. You may have shallow breathing or stop breathing for short periods of time. People with sleep apnea may snore loudly. There are three kinds of sleep apnea: Obstructive sleep apnea. This kind is caused by a blocked or collapsed airway. This is the most common. Central sleep apnea. This kind happens when the part of the brain that controls breathing does not send the correct signals to the muscles that control breathing. Mixed sleep apnea.  This is a combination of obstructive and central sleep apnea. What are the causes? The most common cause of sleep apnea is a collapsed or blocked airway. What increases the risk? Being very overweight. Having family members with sleep apnea. Having a tongue or tonsils that are larger than normal. Having a small airway or jaw problems. Being older. What are the signs or symptoms? Loud snoring. Restless sleep. Trouble staying asleep. Being sleepy or tired during the day. Waking up gasping or choking. Having a headache in the morning. Mood swings. Having a hard time remembering things and concentrating. How is this diagnosed? A medical history. A physical exam. A sleep study. This is also called a polysomnography test. This test is done at a sleep lab or in your home while you are sleeping. How is this treated? Treatment may include: Sleeping on your side. Losing weight if you're overweight. Wearing an oral appliance. This is a mouthpiece that moves your lower jaw forward. Using a positive airway pressure (PAP) device to keep your airways open while you sleep, such as: A continuous positive airway pressure (CPAP) device. This device gives forced air through a mask when you breathe out. This keeps your airways open. A bilevel positive airway pressure (BIPAP) device. This device gives forced air through a mask when you breathe in and when you breathe out to keep your airways open. Having surgery if other treatments do not work. If your sleep apnea is not treated, you may be at risk for: Heart failure. Heart attack. Stroke. Type 2 diabetes or a problem with your blood sugar called insulin resistance. Follow these instructions at  home: Medicines Take your medicines only as told by your health care provider. Avoid alcohol, medicines to help you relax, and certain pain medicines. These may make sleep apnea worse. General instructions Do not smoke, vape, or use products with nicotine  or tobacco in them. If you need help quitting, talk with your provider. If you were given a PAP device to open your airway while you sleep, use it as told by your provider. If you're having surgery, make sure to tell your provider you have sleep apnea. You may need to bring your PAP device with you. Contact a health care provider if: The PAP device that you were given to use during sleep bothers you or does not seem to be working. You do not feel better or you feel worse. Get help right away if: You have trouble breathing. You have chest pain. You have trouble talking. One side of your body feels weak. A part of your face is hanging down. These symptoms may be an emergency. Call 911 right away. Do not wait to see if the symptoms will go away. Do not drive yourself to the hospital. This information is not intended to replace advice given to you by your health care provider. Make sure you discuss any questions you have with your health care provider. Document Revised: 08/06/2023 Document Reviewed: 01/08/2023 Elsevier Patient Education  2024 [employer]. "

## 2024-11-28 NOTE — Progress Notes (Signed)
 "  @Patient  ID: Judy Rodgers, female    DOB: 02/09/1996, 29 y.o.   MRN: [id number]  Chief Complaint  Patient presents with   Consult    Sleep consult  Night sweats, goes to sleep quickly, however wakes up about every hour.    Referring provider: Baird Comer GAILS, NP  HPI: 29 year old female, never smoked. PMH anemia, insomnia, migraines, irregular menses, beta thalassemia.   11/28/2024 Discussed the use of AI scribe software for clinical note transcription with the patient, who gave verbal consent to proceed.   History of Present Illness Judy Rodgers is a 29 year old female who presents for a sleep consult.  She has been experiencing night sweats, tossing and turning, and trouble staying asleep for a couple of years, which have significantly affected her daily life, especially as her children are getting older. She has not undergone a sleep study previously due to fear of the procedure.  She goes to bed between 10 and 11 PM and wakes up 5 to 7 times a night, starting her day at 5:30 to 6 AM. Her weight fluctuates by about 10 pounds. She has not heard herself snore but has been told by others that she snores slightly. No episodes of waking up gasping or choking. Her sleep is restless with some daytime sleepiness, and her Epworth Sleepiness Scale score is 7 out of 24, indicating low daytime sleepiness.  She sleeps on her side and finds it difficult to sleep on her back. Her bedroom is completely dark and cool, with the air set to 68 degrees, yet she still wakes up drenched and restless. She does not frequently use the bathroom at night and has tried melatonin at a dose of 5 mg without success.  She experiences some daytime fatigue, requiring short breaks to rest, but does not have unprovoked sleep attacks or loss of consciousness with emotions. No sleepwalking. Her medical history includes anemia, insomnia, migraines, and hidradenitis suppurativa. She is not on any sedative  medications and does not take any sedatives that are not prescribed.   No concern for narcolepsy or cataplexy. Epworth score 7/24.   Allergies[1]  Immunization History  Administered Date(s) Administered   DTaP 08/08/1996, 07/02/2001, 03/17/2002   HIB (PRP-OMP) 08/08/1996, 08/08/2006   HPV Quadrivalent 08/26/2012, 11/11/2012, 03/21/2013   Hepatitis A 10/04/2012   Hepatitis A, Ped/Adol-2 Dose 01/17/2014   Hepatitis B 1996-04-25, 08/08/1996, 07/21/2001   IPV 08/08/1996, 07/02/2001, 03/17/2002   MMR 07/02/2001, 09/10/2001   Meningococcal Conjugate 08/26/2012   PFIZER Comirnaty(Gray Top)Covid-19 Tri-Sucrose Vaccine 12/23/2020   PFIZER(Purple Top)SARS-COV-2 Vaccination 12/01/2020   Td 10/04/2012   Tdap 10/04/2012, 05/04/2018   Varicella 07/21/2001, 09/10/2001, 10/04/2012    Past Medical History:  Diagnosis Date   Acne    Anemia    Anemia in pregnancy, third trimester 02/02/2018   Bicornuate uterus    Environmental allergies    Headache(784.0)    chronic ha takes motrin  800mg  PRN   Hidradenitis     Tobacco History: Tobacco Use History[2] Counseling given: Not Answered   Outpatient Medications Prior to Visit  Medication Sig Dispense Refill   acetaminophen  (TYLENOL ) 500 MG tablet Take 1,000 mg by mouth every 8 (eight) hours as needed for mild pain or headache.     ferrous sulfate  324 MG TBEC Take 324 mg by mouth.     Secukinumab (COSENTYX) 150 MG/ML SOSY Inject into the skin.     Vitamin D, Ergocalciferol, (DRISDOL) 1.25 MG (50000 UNIT) CAPS capsule Take  50,000 Units by mouth every 7 (seven) days.     No facility-administered medications prior to visit.   Review of Systems  Review of Systems  Constitutional: Negative.   Respiratory: Negative.    Psychiatric/Behavioral:  Positive for sleep disturbance.    Physical Exam  BP 106/66 (BP Location: Left Arm, Patient Position: Sitting)   Pulse 99   Ht 5' 2 (1.575 m)   Wt 143 lb 6.4 oz (65 kg)   SpO2 99% Comment: RA   BMI 26.23 kg/m  Physical Exam Constitutional:      General: She is not in acute distress.    Appearance: Normal appearance. She is well-developed. She is not ill-appearing.  HENT:     Head: Normocephalic and atraumatic.     Mouth/Throat:     Mouth: Mucous membranes are moist.     Pharynx: Oropharynx is clear.  Eyes:     Pupils: Pupils are equal, round, and reactive to light.  Cardiovascular:     Rate and Rhythm: Normal rate and regular rhythm.     Heart sounds: Normal heart sounds. No murmur heard. Pulmonary:     Effort: Pulmonary effort is normal. No respiratory distress.     Breath sounds: Normal breath sounds. No wheezing or rhonchi.  Musculoskeletal:        General: Normal range of motion.     Cervical back: Normal range of motion and neck supple.  Skin:    General: Skin is warm and dry.     Findings: No erythema or rash.  Neurological:     General: No focal deficit present.     Mental Status: She is alert and oriented to person, place, and time. Mental status is at baseline.  Psychiatric:        Mood and Affect: Mood normal.        Behavior: Behavior normal.        Thought Content: Thought content normal.        Judgment: Judgment normal.     Lab Results:  CBC    Component Value Date/Time   WBC 5.7 08/10/2023 0237   RBC 4.05 08/10/2023 0237   HGB 11.1 (L) 08/10/2023 0237   HGB 10.7 (L) 05/04/2018 1044   HCT 35.1 (L) 08/10/2023 0237   HCT 33.6 (L) 05/04/2018 1044   PLT 207 08/10/2023 0237   PLT 194 05/04/2018 0939   MCV 86.7 08/10/2023 0237   MCV 79 05/04/2018 1044   MCH 27.4 08/10/2023 0237   MCHC 31.6 08/10/2023 0237   RDW 12.0 08/10/2023 0237   RDW 19.2 (H) 05/04/2018 1044   LYMPHSABS 1.3 08/10/2023 0237   LYMPHSABS 1.6 05/04/2018 1044   MONOABS 0.5 08/10/2023 0237   EOSABS 0.2 08/10/2023 0237   EOSABS 0.2 05/04/2018 1044   BASOSABS 0.0 08/10/2023 0237   BASOSABS 0.0 05/04/2018 1044    BMET    Component Value Date/Time   NA 135 08/10/2023 0237    NA 141 07/23/2016 1421   K 3.9 08/10/2023 0237   CL 104 08/10/2023 0237   CO2 23 08/10/2023 0237   GLUCOSE 93 08/10/2023 0237   BUN 7 08/10/2023 0237   BUN 9 07/23/2016 1421   CREATININE 0.73 08/10/2023 0237   CREATININE 0.69 02/02/2018 1454   CALCIUM 8.9 08/10/2023 0237   GFRNONAA >60 08/10/2023 0237   GFRNONAA >60 02/02/2018 1454   GFRAA >60 07/16/2018 1609   GFRAA >60 02/02/2018 1454    BNP No results found for: BNP  ProBNP No  results found for: PROBNP  Imaging: No results found.   Assessment & Plan:   1. Loud snoring (Primary) - Home sleep test; Future  Assessment and Plan Assessment & Plan Evaluation for sleep-disordered breathing Chronic sleep disturbances characterized by night sweats, tossing and turning, and difficulty maintaining sleep for several years. No prior sleep study conducted. Reports slight snoring but no episodes of waking up gasping or choking. Epworth Sleepiness Scale score is 7/24, indicating low daytime sleepiness. Differential diagnosis includes sleep apnea, insomnia, and potential hormonal imbalances due to night sweats. BMI is 26, not indicative of weight-related sleep apnea. No significant medical history except for anemia, insomnia, and migraines. No use of sedatives or sleep aids. Melatonin 5 mg was ineffective. - Ordered home sleep study through [facility] to evaluate for sleep apnea. - If sleep apnea is diagnosed, will discuss treatment options including positional therapy, weight loss, oral appliance, CPAP, and potential ENT referral for surgical options. - If sleep apnea is not diagnosed, will consider checking hormone levels due to night sweats. - Will discuss potential short-term use of sleep aids if needed. - Emphasized importance of sleep hygiene.   Almarie LELON Ferrari, NP 11/28/2024     [1]  Allergies Allergen Reactions   Penicillins Nausea And Vomiting and Rash    Has patient had a PCN reaction causing immediate  rash, facial/tongue/throat swelling, SOB or lightheadedness with hypotension: No Has patient had a PCN reaction causing severe rash involving mucus membranes or skin necrosis: No Has patient had a PCN reaction that required hospitalization: No Has patient had a PCN reaction occurring within the last 10 years: No If all of the above answers are NO, then may proceed with Cephalosporin use.    Amoxicillin-Pot Clavulanate Nausea And Vomiting and Other (See Comments)    vertigo   Metronidazole  Nausea And Vomiting and Rash    Nausea - tablets, rash - gel  [2]  Social History Tobacco Use  Smoking Status Never  Smokeless Tobacco Never   "

## 2024-12-21 ENCOUNTER — Encounter

## 2024-12-21 ENCOUNTER — Encounter: Payer: Self-pay | Admitting: Primary Care

## 2025-01-12 ENCOUNTER — Encounter
